# Patient Record
Sex: Female | Born: 1990 | Race: White | Hispanic: No | Smoking: Former smoker
Health system: Southern US, Community
[De-identification: ages and names within clinical notes are randomized; demographics above are authoritative.]

## PROBLEM LIST (undated history)

## (undated) ENCOUNTER — Inpatient Hospital Stay (HOSPITAL_COMMUNITY): Payer: Self-pay

## (undated) DIAGNOSIS — I639 Cerebral infarction, unspecified: Secondary | ICD-10-CM

## (undated) DIAGNOSIS — D649 Anemia, unspecified: Secondary | ICD-10-CM

## (undated) DIAGNOSIS — F419 Anxiety disorder, unspecified: Secondary | ICD-10-CM

## (undated) DIAGNOSIS — E785 Hyperlipidemia, unspecified: Secondary | ICD-10-CM

## (undated) DIAGNOSIS — E079 Disorder of thyroid, unspecified: Secondary | ICD-10-CM

## (undated) DIAGNOSIS — Q8719 Other congenital malformation syndromes predominantly associated with short stature: Secondary | ICD-10-CM

## (undated) DIAGNOSIS — K219 Gastro-esophageal reflux disease without esophagitis: Secondary | ICD-10-CM

## (undated) DIAGNOSIS — I10 Essential (primary) hypertension: Secondary | ICD-10-CM

## (undated) DIAGNOSIS — R51 Headache: Secondary | ICD-10-CM

## (undated) DIAGNOSIS — Z9851 Tubal ligation status: Secondary | ICD-10-CM

## (undated) DIAGNOSIS — F32A Depression, unspecified: Secondary | ICD-10-CM

## (undated) DIAGNOSIS — R519 Headache, unspecified: Secondary | ICD-10-CM

## (undated) DIAGNOSIS — E039 Hypothyroidism, unspecified: Secondary | ICD-10-CM

## (undated) DIAGNOSIS — G43909 Migraine, unspecified, not intractable, without status migrainosus: Secondary | ICD-10-CM

## (undated) DIAGNOSIS — F329 Major depressive disorder, single episode, unspecified: Secondary | ICD-10-CM

## (undated) HISTORY — DX: Anemia, unspecified: D64.9

## (undated) HISTORY — DX: Cerebral infarction, unspecified: I63.9

## (undated) HISTORY — PX: TOE SURGERY: SHX1073

## (undated) HISTORY — DX: Major depressive disorder, single episode, unspecified: F32.9

## (undated) HISTORY — DX: Other congenital malformation syndromes predominantly associated with short stature: Q87.19

## (undated) HISTORY — DX: Depression, unspecified: F32.A

## (undated) HISTORY — DX: Hyperlipidemia, unspecified: E78.5

## (undated) HISTORY — PX: TONSILLECTOMY: SUR1361

## (undated) HISTORY — DX: Migraine, unspecified, not intractable, without status migrainosus: G43.909

---
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
## 2009-03-22 ENCOUNTER — Emergency Department (HOSPITAL_COMMUNITY): Admission: EM | Admit: 2009-03-22 | Discharge: 2009-03-22 | Payer: Self-pay | Admitting: Emergency Medicine

## 2009-04-24 ENCOUNTER — Encounter: Payer: Self-pay | Admitting: Internal Medicine

## 2009-04-24 ENCOUNTER — Encounter: Admission: RE | Admit: 2009-04-24 | Discharge: 2009-04-24 | Payer: Self-pay | Admitting: Internal Medicine

## 2009-05-01 ENCOUNTER — Encounter: Admission: RE | Admit: 2009-05-01 | Discharge: 2009-05-01 | Payer: Self-pay | Admitting: Internal Medicine

## 2009-05-21 ENCOUNTER — Encounter: Payer: Self-pay | Admitting: Internal Medicine

## 2009-05-26 ENCOUNTER — Ambulatory Visit: Payer: Self-pay | Admitting: Internal Medicine

## 2009-05-26 DIAGNOSIS — R112 Nausea with vomiting, unspecified: Secondary | ICD-10-CM

## 2009-05-26 DIAGNOSIS — R1012 Left upper quadrant pain: Secondary | ICD-10-CM

## 2009-05-30 ENCOUNTER — Ambulatory Visit (HOSPITAL_COMMUNITY): Admission: RE | Admit: 2009-05-30 | Discharge: 2009-05-30 | Payer: Self-pay | Admitting: Internal Medicine

## 2010-06-14 ENCOUNTER — Emergency Department (HOSPITAL_COMMUNITY): Admission: EM | Admit: 2010-06-14 | Discharge: 2010-06-15 | Payer: Self-pay | Admitting: Emergency Medicine

## 2010-06-14 ENCOUNTER — Emergency Department (HOSPITAL_COMMUNITY): Admission: EM | Admit: 2010-06-14 | Discharge: 2010-06-14 | Payer: Self-pay | Admitting: Emergency Medicine

## 2010-12-17 LAB — CBC
HCT: 35.9 % — ABNORMAL LOW (ref 36.0–46.0)
Hemoglobin: 12.3 g/dL (ref 12.0–15.0)
MCH: 30.8 pg (ref 26.0–34.0)
MCHC: 34.3 g/dL (ref 30.0–36.0)
RBC: 3.99 MIL/uL (ref 3.87–5.11)

## 2010-12-17 LAB — URINALYSIS, ROUTINE W REFLEX MICROSCOPIC
Bilirubin Urine: NEGATIVE
Glucose, UA: NEGATIVE mg/dL
Ketones, ur: NEGATIVE mg/dL
Nitrite: NEGATIVE
Specific Gravity, Urine: 1.012 (ref 1.005–1.030)
pH: 8 (ref 5.0–8.0)

## 2010-12-17 LAB — DIFFERENTIAL
Basophils Relative: 0 % (ref 0–1)
Eosinophils Absolute: 0.1 10*3/uL (ref 0.0–0.7)
Lymphs Abs: 1.8 10*3/uL (ref 0.7–4.0)
Monocytes Absolute: 0.6 10*3/uL (ref 0.1–1.0)
Monocytes Relative: 7 % (ref 3–12)
Neutro Abs: 6.3 10*3/uL (ref 1.7–7.7)
Neutrophils Relative %: 71 % (ref 43–77)

## 2010-12-17 LAB — PREGNANCY, URINE: Preg Test, Ur: NEGATIVE

## 2010-12-17 LAB — WET PREP, GENITAL
Clue Cells Wet Prep HPF POC: NONE SEEN
Trich, Wet Prep: NONE SEEN
Yeast Wet Prep HPF POC: NONE SEEN

## 2010-12-17 LAB — POCT URINALYSIS DIPSTICK
Bilirubin Urine: NEGATIVE
Glucose, UA: NEGATIVE mg/dL
Ketones, ur: NEGATIVE mg/dL
Nitrite: NEGATIVE

## 2010-12-17 LAB — POCT PREGNANCY, URINE: Preg Test, Ur: NEGATIVE

## 2011-01-11 LAB — GC/CHLAMYDIA PROBE AMP, GENITAL: Chlamydia, DNA Probe: NEGATIVE

## 2011-01-11 LAB — POCT I-STAT, CHEM 8
BUN: 4 mg/dL — ABNORMAL LOW (ref 6–23)
Calcium, Ion: 1.17 mmol/L (ref 1.12–1.32)
Chloride: 109 mEq/L (ref 96–112)
Creatinine, Ser: 0.7 mg/dL (ref 0.4–1.2)
Glucose, Bld: 91 mg/dL (ref 70–99)
TCO2: 25 mmol/L (ref 0–100)

## 2011-01-11 LAB — URINALYSIS, ROUTINE W REFLEX MICROSCOPIC
Glucose, UA: NEGATIVE mg/dL
Nitrite: NEGATIVE
Specific Gravity, Urine: 1.002 — ABNORMAL LOW (ref 1.005–1.030)
pH: 6.5 (ref 5.0–8.0)

## 2011-01-11 LAB — HCG, QUANTITATIVE, PREGNANCY: hCG, Beta Chain, Quant, S: <5 m[IU]/mL (ref <5)

## 2011-01-11 LAB — WET PREP, GENITAL: Yeast Wet Prep HPF POC: NONE SEEN

## 2011-02-19 NOTE — Op Note (Signed)
Christine Beard, Christine Beard            ACCOUNT NO.:  [id number]   MEDICAL RECORD NO.:  [id number]          PATIENT TYPE:  AMB   LOCATION:  DSC                          FACILITY:  [facility]   PHYSICIAN:  Suzanna Obey, M.D.       DATE OF BIRTH:  07-Nov-1990   DATE OF PROCEDURE:  11/12/2004  DATE OF DISCHARGE:                                 OPERATIVE REPORT   PREOPERATIVE DIAGNOSIS:  Chronic tonsillitis.   POSTOPERATIVE DIAGNOSIS:  Chronic tonsillitis.   OPERATION PERFORMED:  Tonsillectomy.   SURGEON:  Suzanna Obey, M.D.   ANESTHESIA:  General endotracheal tube.   ESTIMATED BLOOD LOSS:  Less than 5 mL.   INDICATIONS FOR PROCEDURE:  The patient is a 20 year old who has had chronic  sore throats for many years that have been refractory to medical therapy.  She and her mother were informed of the risks and benefits of the procedure  including bleeding, infection, velopharyngeal insufficiency, change in the  voice, chronic pain, and risks of the anesthetic.  All questions were  answered and consent was obtained.   DESCRIPTION OF PROCEDURE:  The patient was taken to the operating room and  placed in supine position.  After adequate general endotracheal tube  anesthesia, the patient was placed in the Rose position, draped in the usual  sterile manner.  The Crowe-Davis mouth gag was inserted, retracted and  suspended from the Mayo stand.  The palate was checked and there was no  adenoid tissue and the palate was of adequate length.  The left tonsil was  begun by making a left anterior tonsillar pillar incision identifying the  capsule and tonsil and removing it with electrocautery dissection.  The  right tonsil removed in the same fashion.  There was good hemostasis with  suction cautery.  The hypopharynx, esophagus and stomach were suctioned with  the nasogastric tube.  The patient was then released, the Crowe-Davis was  resuspended and there was hemostasis present in all locations.  The patient  was then awakened and brought to recovery in stable condition.  Counts were  correct.      JB/MEDQ  D:  11/12/2004  T:  11/12/2004  Job:  [id number]   cc:   Olena Leatherwood [facility]

## 2011-04-08 ENCOUNTER — Inpatient Hospital Stay (HOSPITAL_COMMUNITY)
Admission: EM | Admit: 2011-04-08 | Discharge: 2011-04-13 | DRG: 918 | Disposition: A | Discharge status: Home / Self Care | Payer: Medicaid Other | Attending: Internal Medicine | Admitting: Internal Medicine

## 2011-04-08 DIAGNOSIS — F17200 Nicotine dependence, unspecified, uncomplicated: Secondary | ICD-10-CM | POA: Diagnosis present

## 2011-04-08 DIAGNOSIS — E039 Hypothyroidism, unspecified: Secondary | ICD-10-CM | POA: Diagnosis present

## 2011-04-08 DIAGNOSIS — T6391XA Toxic effect of contact with unspecified venomous animal, accidental (unintentional), initial encounter: Principal | ICD-10-CM | POA: Diagnosis present

## 2011-04-08 DIAGNOSIS — D509 Iron deficiency anemia, unspecified: Secondary | ICD-10-CM | POA: Diagnosis present

## 2011-04-08 DIAGNOSIS — Y92410 Unspecified street and highway as the place of occurrence of the external cause: Secondary | ICD-10-CM

## 2011-04-08 DIAGNOSIS — T63001A Toxic effect of unspecified snake venom, accidental (unintentional), initial encounter: Secondary | ICD-10-CM | POA: Diagnosis present

## 2011-04-08 DIAGNOSIS — F341 Dysthymic disorder: Secondary | ICD-10-CM | POA: Diagnosis present

## 2011-04-09 LAB — CBC
HCT: 38.1 % (ref 36.0–46.0)
HCT: 37.8 % (ref 36.0–46.0)
Hemoglobin: 12.4 g/dL (ref 12.0–15.0)
MCH: 28.8 pg (ref 26.0–34.0)
MCH: 30.1 pg (ref 26.0–34.0)
MCHC: 31.2 g/dL (ref 30.0–36.0)
MCHC: 32.3 g/dL (ref 30.0–36.0)
MCHC: 32.4 g/dL (ref 30.0–36.0)
MCHC: 33.2 g/dL (ref 30.0–36.0)
MCV: 90.7 fL (ref 78.0–100.0)
MCV: 92 fL (ref 78.0–100.0)
Platelets: 133 10*3/uL — ABNORMAL LOW (ref 150–400)
Platelets: 138 10*3/uL — ABNORMAL LOW (ref 150–400)
Platelets: 165 10*3/uL (ref 150–400)
RBC: 4.16 MIL/uL (ref 3.87–5.11)
RBC: 4.28 MIL/uL (ref 3.87–5.11)
RDW: 12.5 % (ref 11.5–15.5)
RDW: 12.6 % (ref 11.5–15.5)
RDW: 12.6 % (ref 11.5–15.5)
WBC: 12.5 10*3/uL — ABNORMAL HIGH (ref 4.0–10.5)

## 2011-04-09 LAB — URINALYSIS, ROUTINE W REFLEX MICROSCOPIC
Ketones, ur: NEGATIVE mg/dL
Leukocytes, UA: NEGATIVE
Nitrite: NEGATIVE
Protein, ur: NEGATIVE mg/dL
pH: 6 (ref 5.0–8.0)

## 2011-04-09 LAB — PROTIME-INR
INR: 1.14 (ref 0.00–1.49)
INR: 1.07 (ref 0.00–1.49)
Prothrombin Time: 14.8 seconds (ref 11.6–15.2)

## 2011-04-09 LAB — DIFFERENTIAL
Basophils Absolute: 0 10*3/uL (ref 0.0–0.1)
Eosinophils Absolute: 0.2 10*3/uL (ref 0.0–0.7)
Eosinophils Relative: 1 % (ref 0–5)
Eosinophils Relative: 2 % (ref 0–5)
Lymphocytes Relative: 15 % (ref 12–46)
Lymphs Abs: 1.5 10*3/uL (ref 0.7–4.0)
Monocytes Absolute: 0.7 10*3/uL (ref 0.1–1.0)
Monocytes Relative: 9 % (ref 3–12)
Neutro Abs: 9.4 10*3/uL — ABNORMAL HIGH (ref 1.7–7.7)
Neutrophils Relative %: 76 % (ref 43–77)

## 2011-04-09 LAB — BASIC METABOLIC PANEL
BUN: 11 mg/dL (ref 6–23)
CO2: 21 mEq/L (ref 19–32)
Calcium: 8.2 mg/dL — ABNORMAL LOW (ref 8.4–10.5)
Glucose, Bld: 86 mg/dL (ref 70–99)
Sodium: 138 mEq/L (ref 135–145)

## 2011-04-09 LAB — PHOSPHORUS: Phosphorus: 4.4 mg/dL (ref 2.3–4.6)

## 2011-04-09 LAB — TSH: TSH: 2.216 u[IU]/mL (ref 0.350–4.500)

## 2011-04-09 LAB — PREGNANCY, URINE: Preg Test, Ur: NEGATIVE

## 2011-04-09 LAB — COMPREHENSIVE METABOLIC PANEL
Albumin: 3.1 g/dL — ABNORMAL LOW (ref 3.5–5.2)
Alkaline Phosphatase: 53 U/L (ref 39–117)
BUN: 9 mg/dL (ref 6–23)
Potassium: 3.7 mEq/L (ref 3.5–5.1)
Total Protein: 5.7 g/dL — ABNORMAL LOW (ref 6.0–8.3)

## 2011-04-09 LAB — FIBRINOGEN
Fibrinogen: 240 mg/dL (ref 204–475)
Fibrinogen: 287 mg/dL (ref 204–475)

## 2011-04-09 LAB — MAGNESIUM: Magnesium: 2 mg/dL (ref 1.5–2.5)

## 2011-04-09 LAB — APTT: aPTT: 30 seconds (ref 24–37)

## 2011-04-10 LAB — URINE CULTURE: Culture  Setup Time: 201207061544

## 2011-04-10 LAB — BASIC METABOLIC PANEL
BUN: 7 mg/dL (ref 6–23)
CO2: 24 mEq/L (ref 19–32)
Chloride: 111 mEq/L (ref 96–112)
GFR calc non Af Amer: >60 mL/min (ref >60)
Glucose, Bld: 130 mg/dL — ABNORMAL HIGH (ref 70–99)
Potassium: 3.9 mEq/L (ref 3.5–5.1)

## 2011-04-10 LAB — IRON AND TIBC
Iron: 19 ug/dL — ABNORMAL LOW (ref 42–135)
TIBC: 199 ug/dL — ABNORMAL LOW (ref 250–470)
UIBC: 180 ug/dL

## 2011-04-10 LAB — CBC
HCT: 34.5 % — ABNORMAL LOW (ref 36.0–46.0)
Hemoglobin: 11.2 g/dL — ABNORMAL LOW (ref 12.0–15.0)
MCHC: 32.5 g/dL (ref 30.0–36.0)
RBC: 3.75 MIL/uL — ABNORMAL LOW (ref 3.87–5.11)

## 2011-04-10 LAB — DIFFERENTIAL
Basophils Absolute: 0 10*3/uL (ref 0.0–0.1)
Lymphocytes Relative: 11 % — ABNORMAL LOW (ref 12–46)
Monocytes Absolute: 0.8 10*3/uL (ref 0.1–1.0)
Neutro Abs: 6.5 10*3/uL (ref 1.7–7.7)
Neutrophils Relative %: 80 % — ABNORMAL HIGH (ref 43–77)

## 2011-04-10 LAB — VITAMIN B12: Vitamin B-12: 388 pg/mL (ref 211–911)

## 2011-04-11 LAB — DIFFERENTIAL
Basophils Relative: 0 % (ref 0–1)
Eosinophils Absolute: 0 10*3/uL (ref 0.0–0.7)
Monocytes Absolute: 0.3 10*3/uL (ref 0.1–1.0)
Monocytes Relative: 3 % (ref 3–12)

## 2011-04-11 LAB — FIBRINOGEN: Fibrinogen: 257 mg/dL (ref 204–475)

## 2011-04-11 LAB — CBC
MCH: 29.2 pg (ref 26.0–34.0)
MCHC: 31.5 g/dL (ref 30.0–36.0)
Platelets: 135 10*3/uL — ABNORMAL LOW (ref 150–400)

## 2011-04-11 LAB — BASIC METABOLIC PANEL
CO2: 23 mEq/L (ref 19–32)
Calcium: 7.8 mg/dL — ABNORMAL LOW (ref 8.4–10.5)
Creatinine, Ser: 0.53 mg/dL (ref 0.50–1.10)
Glucose, Bld: 162 mg/dL — ABNORMAL HIGH (ref 70–99)
Sodium: 138 mEq/L (ref 135–145)

## 2011-04-11 NOTE — H&P (Signed)
Christine Beard, Christine Beard            ACCOUNT NO.:  [id number]  MEDICAL RECORD NO.:  [id number]  LOCATION:  WLED                         FACILITY:  [facility]  PHYSICIAN:  Michiel Cowboy, MDDATE OF BIRTH:  08-20-1991  DATE OF ADMISSION:  04/08/2011 DATE OF DISCHARGE:                             HISTORY & PHYSICAL   PRIMARY CARE PROVIDER:  None.  CHIEF COMPLAINT:  The patient is a 20 year old female who around 10 to 10:30 p.m. on the 5th was walking and saw a copperhead on the road.  She attempted to remove it with her hand and got bit to the tip of her palm on the right hand.  Since then, she has developed some swelling of the hand, presented to the emergency department, received CroFab and Dilaudid for pain management.  She developed some progression of the swelling above the elbow and continued to have pain at which point emergency physician wrote for 2nd dose of CroFab and she is going to be admitted for further observation.  I have spoke to [facility], who recommended monitoring her after the 2nd dose was given and then deciding whether her maintenance would be needed.  If there is still progression of the circumference of swelling or worsening pain, then she may benefit from maintenance dosage of CroFab 2 g IV q.6 h. for 3 more doses.  At this point, we will just monitor.  The patient does endorse some itching, which is generalized.  There is no rash.  The itching started when she had simultaneously received Dilaudid and CroFab.  Since then, she had been receiving regular doses of Dilaudid. She did receive 25 IV of Benadryl, which made her sleepy, but she still has some itching persistent.  There is no fever, no chills.  No nausea, vomiting, or diarrhea.  Otherwise, no shortness of breath.  No chest pain.  Rest of the review of systems is negative except for HPI.  PAST MEDICAL HISTORY:  Significant for anxiety and depression.  SOCIAL HISTORY:  The patient is a  smoker.  Does not drink or abuse drugs.  FAMILY HISTORY:  Noncontributory.  ALLERGIES:  Codeine makes her nauseous.  MEDICATIONS:  She cannot recollect, but she thinks she takes a thyroid medicine, a depression medicine, and something for sleep.  PHYSICAL EXAMINATION:  VITAL SIGNS:  Temperature 98.7, blood pressure 123/70, pulse 97, respirations 22, satting 98% on room air. GENERAL:  She appears to be currently in no acute distress. HEENT:  Head:  Nontraumatic.  Moist mucous membranes. LUNGS:  Clear to auscultation bilaterally. HEART:  Regular rate and rhythm.  No murmurs appreciated. ABDOMEN:  Soft, nontender, nondistended. EXTREMITIES:  Lower extremities without clubbing, cyanosis, or edema. Right upper extremity showing some swelling of the hand.  She actually states that the hand swelling has improved somewhat.  The swelling has progressed about 3 cm above her elbow.  There is no redness.  There is no rash.  No blistering.  Of note, she has a scratch on her back that is unrelated and was secondary to a different injury.  LABORATORY DATA:  White blood cell count 8.3, hemoglobin 12.9, platelets 133.  Unknown baseline.  Creatinine 0.69, sodium 138, potassium 3.6,  INR 1.14 with fibrinogen of 258, which is normal.  ASSESSMENT AND PLAN: 1. This is a 20 year old with a snake bite.  The snake has been     brought with her and appears to be a copperhead.  The patient is     about to receive her 2nd dose of CroFab.  We will continue     monitoring after that.  If the patient continues to have     progression of enlargement of her swelling and particularly the     enlargement of circumference of the swelling some progression of     the arm is actually expected and worsening pain, may benefit from a     maintenance dose initiated 6 hours after the second dose of CroFab,     which is 2 g IV q.6 for 3 more doses.  If some improvement is noted     within the window of the 6 hours, we can  hold off on the     maintenance dose.  We will watch her in the step-down for close     monitoring.  Recheck her fibrinogen, CBC and INR q.4 h. for 12     hours.  So far, her platelets have been a little bit low.  We will     continue to watch her trend. 2. History of depression and anxiety.  We will need to restart her     medications once her boyfriend brings then in to really do not know     what she is actually taking. 3. History of thyroid abnormality.  We will check a TSH and restart     her meds once we know what they are. 4. Itching.  This is unclear if it is related to Dilaudid or CroFab     since she had been receiving both at the same time and itching has     persisted, but she also has received a total of 3 doses of Dilaudid     at least.  We will switch her to morphine for pain control and     continue to give Benadryl as needed.  If her symptoms worsen and if     she develops generalized rash or worsening itching after the 2nd     dose of CroFab, may benefit from Solu-Medrol.  At this point, she     has not had any rash or difficulty breathing or any other swelling     besides the site of the snakebite. 5. Prophylaxis.  Good p.o. intake and SCDs.     Michiel Cowboy, MD     AVD/MEDQ  D:  04/09/2011  T:  04/09/2011  Job:  [id number]  Electronically Signed by Therisa Doyne MD on 04/11/2011 03:00:49 AM

## 2011-04-12 LAB — DIFFERENTIAL
Basophils Absolute: 0 10*3/uL (ref 0.0–0.1)
Eosinophils Relative: 0 % (ref 0–5)
Lymphocytes Relative: 18 % (ref 12–46)
Monocytes Absolute: 1 10*3/uL (ref 0.1–1.0)
Monocytes Relative: 10 % (ref 3–12)

## 2011-04-12 LAB — BASIC METABOLIC PANEL
BUN: 7 mg/dL (ref 6–23)
CO2: 28 mEq/L (ref 19–32)
GFR calc non Af Amer: >60 mL/min (ref >60)
Glucose, Bld: 95 mg/dL (ref 70–99)
Potassium: 3.7 mEq/L (ref 3.5–5.1)

## 2011-04-12 LAB — CBC
HCT: 34.9 % — ABNORMAL LOW (ref 36.0–46.0)
MCH: 29.8 pg (ref 26.0–34.0)
MCHC: 32.1 g/dL (ref 30.0–36.0)
MCV: 92.8 fL (ref 78.0–100.0)
RDW: 13 % (ref 11.5–15.5)

## 2011-04-13 LAB — CBC
MCH: 29.4 pg (ref 26.0–34.0)
MCHC: 31.5 g/dL (ref 30.0–36.0)
MCV: 93.4 fL (ref 78.0–100.0)
Platelets: 146 10*3/uL — ABNORMAL LOW (ref 150–400)
RBC: 3.77 MIL/uL — ABNORMAL LOW (ref 3.87–5.11)
RDW: 13.1 % (ref 11.5–15.5)

## 2011-04-21 NOTE — Discharge Summary (Signed)
Christine Beard NO.:  [id number]  MEDICAL RECORD NO.:  [id number]  LOCATION:  1508                         FACILITY:  [facility]  PHYSICIAN:  Ramiro Harvest, MD    DATE OF BIRTH:  1991-04-25  DATE OF ADMISSION:  04/08/2011 DATE OF DISCHARGE:  04/13/2011                        DISCHARGE SUMMARY    ANTICIPATED DATE OF DISCHARGE:  April 13, 2011  PRIMARY CARE PHYSICIAN:  Tomi Bamberger, NP, in [place].  DISCHARGE DIAGNOSES: 1. Snakebite, improved. 2. Iron-deficiency anemia. 3. Hypothyroidism. 4. Anxiety. 5. Depression. 6. Tobacco abuse.  DISCHARGE MEDICATIONS: 1. Hydroxyzine 25 mg p.o. q.6 h p.r.n. itching. 2. Oxycodone 5 mg p.o. q.4 h p.r.n. pain. 3. Aleve 220 mg 2 tablets p.o. q.6 h p.r.n. 4. Celexa 40 mg p.o. daily. 5. Levothyroxine 50 mcg p.o. daily. 6. Multivitamin with iron 1 tablet p.o. daily. 7. Prozac 40 mg p.o. daily. 8. Trazodone 50 mg 2 tablets p.o. at bedtime. 9. Zofran 4 mg p.o. q.8 h p.r.n.  DISPOSITION AND FOLLOWUP:  The patient will be discharged home.  The patient is to follow up with PCP 1 week post discharge.  On followup, the patient is supposed to keep her right upper extremity elevated above her heart to aid with the swelling.  The patient will also be followed by Poison Control once the patient is discharged.  On her followup with PCP, the patient will need a CBC checked to follow up on her platelets and hemoglobin.  CONSULTATIONS DONE:  None.  PROCEDURES PERFORMED:  None.  BRIEF ADMISSION HISTORY AND PHYSICAL:  Ms. Christine Beard is a 20 year old female who around 10 to 10:30 p.m. on July 5 was walking, saw a copperhead on the road.  She attempted to remove it with her hand and got bit at the tip of her palm on the right hand.  Since then she developed some swelling of the hand, presented to the ED, received CroFab and Dilaudid for pain management.  The patient developed some progression of the swelling above the elbow,  continued to have pain at which point ED wrote for a second dose of CroFab and the patient was being admitted for further observation.  Admitting physician spoke to [facility] who recommended monitoring her after the second dose was given and deciding whether maintenance would be needed.  If there was still progression of the circumference of swelling or worsening pain, the patient may benefit from maintenance doses of CroFab 2 g IV q.6 h for three more doses.  The patient did endorse some itching, which was generalized, denied any rash.  The itching started when she had simultaneously received Dilaudid and CroFab.  Since then the patient had been receiving regular doses of Dilaudid.  She did receive Benadryl 25 mg IV which made her sleepy but itching was still persistent.  Denied any fever.  No chills, no nausea, no vomiting, no diarrhea.  Otherwise, no shortness of breath, no chest pain.  Review of systems was negative except for HPI.  For the rest of admission history and physical, please see H and P dictated by Dr. Adela Glimpse of job number [id number].  HOSPITAL COURSE: 1. Snake bite:  The patient was admitted with a  snakebite which     appeared to be a copperhead.  The patient received two doses of     CroFab in the ED.  She was subsequently admitted to the step-down     unit where she was monitored.  The patient's swelling was pursued     and swelling progressed somewhat and as such she was given two more     doses of CroFab.  The patient was also given some IV steroids as     well as hydroxyzine for itching which relieved her itching.  The     patient was monitored and followed.  Coags were obtained, which     were within normal limits, as well as fibrinogen levels were also     obtained which were within normal limits.  The patient's upper     extremity was further elevated above her heart to help with the     swelling.  The patient remained in stable condition and the  patient     improved somewhat.  The patient was subsequently transferred to the     floor where she remained.  Measurements every 4 hours of the     circumference of the hand, wrist, forearm and biceps were done and     showed decrease in circumference from the hand and wrists as well     as the forearm and biceps.  This continued to improve and the     swelling improved and the patient is currently in a stable and     improved condition.  The patient will be discharged home in stable     and improved condition to follow up with PCP as stated above.     Poison Control Center will be notified when the patient is being     discharged so she can be followed up as an outpatient.  The patient     was discharged in stable and improved condition. 2. The rest of the patient's chronic medical issues remained stable     throughout the hospitalization. 3. Labs as of April 12, 2011:  White count of 10.2, hemoglobin 11.2,     hematocrit 34.9 with a platelet count of 131.  BMET with a sodium     of 142, potassium 3.7, chloride 110, bicarb 28, glucose 95, BUN 7,     creatinine 0.58, and calcium of 8.3.  It has been a pleasure taking care of Ms. Christine Beard.     Ramiro Harvest, MD     DT/MEDQ  D:  04/12/2011  T:  04/12/2011  Job:  [id number]  cc:   Tomi Bamberger, NP Fax: [phone]  Electronically Signed by Ramiro Harvest MD on 04/21/2011 06:32:51 PM

## 2011-04-22 ENCOUNTER — Ambulatory Visit
Admission: RE | Admit: 2011-04-22 | Discharge: 2011-04-22 | Disposition: A | Discharge status: Home / Self Care | Payer: Medicaid Other | Source: Ambulatory Visit | Attending: Internal Medicine | Admitting: Internal Medicine

## 2011-04-22 ENCOUNTER — Other Ambulatory Visit: Payer: Self-pay | Admitting: Internal Medicine

## 2011-04-22 DIAGNOSIS — R52 Pain, unspecified: Secondary | ICD-10-CM

## 2011-12-15 ENCOUNTER — Emergency Department (HOSPITAL_COMMUNITY)
Admission: EM | Admit: 2011-12-15 | Discharge: 2011-12-15 | Disposition: A | Discharge status: Home / Self Care | Payer: Medicaid Other | Source: Home / Self Care

## 2011-12-15 ENCOUNTER — Encounter (HOSPITAL_COMMUNITY): Payer: Self-pay | Admitting: *Deleted

## 2011-12-15 DIAGNOSIS — R112 Nausea with vomiting, unspecified: Secondary | ICD-10-CM

## 2011-12-15 HISTORY — DX: Disorder of thyroid, unspecified: E07.9

## 2011-12-15 LAB — POCT URINALYSIS DIP (DEVICE)
Glucose, UA: NEGATIVE mg/dL
Nitrite: NEGATIVE
Protein, ur: NEGATIVE mg/dL
Urobilinogen, UA: 0.2 mg/dL (ref 0.0–1.0)

## 2011-12-15 LAB — POCT PREGNANCY, URINE: Preg Test, Ur: NEGATIVE

## 2011-12-15 MED ORDER — ONDANSETRON HCL 4 MG PO TABS: 4.0000 mg | ORAL_TABLET | Freq: Three times a day (TID) | ORAL | Status: AC | PRN

## 2011-12-15 MED ORDER — PROMETHAZINE HCL 12.5 MG PO TABS: 12.5000 mg | ORAL_TABLET | Freq: Four times a day (QID) | ORAL | Status: DC | PRN

## 2011-12-15 MED ORDER — PROMETHAZINE HCL 12.5 MG RE SUPP: 12.5000 mg | Freq: Four times a day (QID) | RECTAL | Status: DC | PRN

## 2011-12-15 NOTE — ED Notes (Signed)
pT  C/PO  NAUSEA    AND  VOMITING  X  4  DAYS  WITH  ASSOCIATED  SYMPTOMS  OF  DIZZYNESS  AS  WELL   PT  UNSURE  OF HER LMP    AND  STOPPED  HER  DEPO  SHOT  SHE IS  ALSO  SEXUALLY  ACTIVE      -  SHE IS  AWAKE  AS  WELL AS  ALERTA ND  ORIENTED  SKIN IS  WARM  AND  DRY

## 2011-12-15 NOTE — ED Provider Notes (Signed)
Christine Beard is a 21 y.o. female who presents to Urgent Care today for vomiting and nausea for 3 days.  Patient developed nausea vomiting and dizziness starting Saturday evening.  She has continued to have nausea and vomiting since then. She denies any fevers chills abdominal pain vertigo syncope diarrhea or blood in the stool or vomit.  She is a pertinent past history of frequent episodes of nausea and has been on Zofran in the past.  She cannot remember when her last menstrual period was however she has been using depo Provera.  Her last dose of depo was 5 months ago.  She has been having unprotected sex.     PMH reviewed. Significant for hypothyroidism, depression, frequent vomiting ROS as above otherwise neg Medications reviewed. No current facility-administered medications for this encounter.   Current Outpatient Prescriptions  Medication Sig Dispense Refill  . citalopram (CELEXA) 40 MG tablet Take 40 mg by mouth daily.      Marland Kitchen FLUoxetine (PROZAC) 40 MG capsule Take 40 mg by mouth daily.      Marland Kitchen levothyroxine (SYNTHROID, LEVOTHROID) 50 MCG tablet Take 50 mcg by mouth daily.      . ondansetron (ZOFRAN) 4 MG tablet Take 4 mg by mouth every 8 (eight) hours as needed.      . traZODone (DESYREL) 100 MG tablet Take 100 mg by mouth at bedtime.      . ondansetron (ZOFRAN) 4 MG tablet Take 1 tablet (4 mg total) by mouth every 8 (eight) hours as needed for nausea.  20 tablet  0  . promethazine (PHENERGAN) 12.5 MG suppository Place 1 suppository (12.5 mg total) rectally every 6 (six) hours as needed for nausea.  12 each  0  . promethazine (PHENERGAN) 12.5 MG tablet Take 1 tablet (12.5 mg total) by mouth every 6 (six) hours as needed for nausea.  30 tablet  0    Exam:  BP 124/76  Pulse 70  Temp(Src) 98.6 F (37 C) (Oral)  Resp 18  SpO2 100% Gen: Well NAD, petite woman. HEENT: EOMI,  MMM Lungs: CTABL Nl WOB Heart: RRR no MRG Abd: NABS, NT, ND Exts: Non edematous BL  LE, warm and well  perfused.   Urine pregnancy test is negative Urinalysis is bland and normal  Assessment and Plan: 21 year old woman with vomiting.  No abdominal pain fevers chills or other indication of viral gastroenteritis or other serious abdominal process. 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  She appears to be clinically stable tonight.  Plan to prescribe Zofran, oral and rectal Phenergan as needed.  I encouraged her to followup with a primary care doctor as soon as possible and provided instructions on how to do that. Additionally I advised using condoms to not get pregnant. Her amenorrhea is normal following Depo-Provera.  Discussed precautions with patient who expresses understanding.     Rodolph Bong, MD 12/15/11 [phone]

## 2011-12-15 NOTE — Discharge Instructions (Signed)
Thank you for coming in today. I am not sure why you continue to have vomiting.   Use the Zofran and Phenergan pills as needed.  The Phenergan will make you sleepy. If you cannot keep the Phenergan or Zofran pills down use the Phenergan suppository up your bottom as needed. Try to drink small amounts of liquids frequently.  G-2 Gatorade will help. Try to eat what you can.  Call [phone] to get a doctor near you. Use condoms every time when you have sex so that you do not get pregnant. Go to the emergency room for very bad belly pain, or uncontrollable vomiting.  Cyclic Vomiting Syndrome Cyclic vomiting syndrome (CVS) is a benign condition in which patients experience bouts or cycles of severe nausea and vomiting that last for hours or even days. The bouts of nausea and vomiting alternate with longer periods of no symptoms and generally good health. CVS occurs mostly in children, but can affect adults. CVS has no known cause. Each episode is typically similar to the previous ones. The episodes tend to:   Start at about the same time of day.   Last the same length of time.   Present the same symptoms at the same level of intensity.  CVS can begin at any age in children and adults. CVS usually starts between the ages of 54 and 92. In adults, episodes tend to occur less often than they do in children, but they last longer. Furthermore, the events or situations that trigger episodes in adults cannot always be pinpointed as easily as they can in children. THE FOUR PHASES OF CVS 1. Prodrome.  2. Episode.  3. Recovery.  4. Symptom-free interval.  The prodrome phase signals that an episode of nausea and vomiting is about to begin. This phase can last from just a few minutes to several hours. This phase is often marked by belly (abdominal) pain. Sometimes taking medicine early in the prodrome phase can stop an episode in progress. However, sometimes there is no warning. A person may simply wake up in  the middle of the night or early morning and begin vomiting. The episode phase consists of:  Severe vomiting.   Nausea.   Gagging (retching).  The recovery phase begins when the nausea and vomiting stop. Healthy color, appetite, and energy return. The symptom-free interval phase is the period between episodes when no symptoms are present. TRIGGERS Episodes can be triggered by an infection or event. Examples of triggers include:  Infections.   Colds, allergies, sinus problems, and the flu.   Eating certain foods such as chocolate or cheese.   Foods with MSG or preservatives.   Fast foods.   Pre-packaged foods.   Foods with low nutritional value (junk foods).   Overeating.   Eating just before going to bed.   Hot weather.   Dehydration.   Not enough sleep or poor sleep quality.   Physical exhaustion.   Menstruation.   Motion sickness.   Emotional stress (school or home difficulties).   Excitement or stress.  SYMPTOMS  The main symptoms of CVS are:  Severe vomiting.   Nausea.   Gagging (retching).  Episodes usually begin at night or the first thing in the morning. Episodes may include vomiting or retching up to 5 or 6 times an hour during the worst of the episode. Episodes usually last anywhere from 1 to 4 days. Episodes can last for up to 10 days. Other symptoms include:  Paleness.   Exhaustion.  Listlessness.   Abdominal pain.   Loose stools or diarrhea.  Sometimes the nausea and vomiting are so severe that a person appears to be almost unconscious. Sensitivity to light, headache, fever, dizziness, may also accompany an episode. In addition, the vomiting may cause drooling and excessive thirst. Drinking water usually leads to more vomiting, though the water can dilute the acid in the vomit, making the episode a little less painful. Continuous vomiting can lead to dehydration, which means that the body has lost excessive water and salts. DIAGNOSIS    CVS is hard to diagnose because there are no clear tests to identify it. A caregiver must diagnose CVS by looking at symptoms and medical history. A caregiver must exclude more common diseases or disorders that can also cause nausea and vomiting. Also, diagnosis takes time because caregivers need to identify a pattern or cycle to the vomiting. CVS AND MIGRAINE The relationship between migraine and CVS is still unclear. Medical researchers believe that they are related for 3 reasons: 1. Migraine headaches (which cause severe pain in the head), abdominal migraine (which causes stomach pain), and CVS are all marked by severe symptoms that start quickly and end abruptly, followed by longer periods without pain or other symptoms.  2. Many of the situations that trigger CVS also trigger migraines. Those triggers include stress and excitement.  3. Research has shown that many children with CVS either have a family history of migraine or develop migraines as they grow older.  Because of the similarities between migraine and CVS, caregivers treat some people with severe CVS with drugs that are also used for migraine headaches. The drugs are designed to:  Prevent episodes.   Reduce their frequency.   Lessen their severity.  TREATMENT  CVS cannot be cured. Treatment varies, but people with CVS should get plenty of rest and sleep and take medications that prevent, stop, or lessen the vomiting episodes and other symptoms. Once a vomiting episode begins, treatment is supportive. It helps to stay in bed and sleep in a dark, quiet room. Severe nausea and vomiting may require hospitalization and intravenous (IV) fluids to prevent dehydration. Relaxing medications (sedatives) may help if the nausea continues. Sometimes, during the prodrome phase, it is possible to stop an episode from happening altogether. Only take over-the-counter or prescription medicines for pain, discomfort or fever as directed by your  caregiver. Do not give aspirin to children. During the recovery phase, drinking water and replacing lost electrolytes (salts in the blood) are very important. Electrolytes are salts that the body needs to function well and stay healthy. Symptoms during the recovery phase can vary. Some people find that their appetites return to normal immediately, while others need to begin by drinking clear liquids and then move slowly to solid food. People whose episodes are frequent and long-lasting may be treated during the symptom-free intervals in an effort to prevent or ease future episodes. Medications that help people with migraine headaches are sometimes used during this phase, but they do not work for everyone. Taking the medicine daily for 1 to 2 months may be necessary to see if it helps. The symptom-free phase is a good time to eliminate anything known to trigger an episode. For example, if episodes are brought on by stress or excitement, this period is the time to find ways to reduce stress and stay calm. If sinus problems or allergies cause episodes, those conditions should be treated. The triggers listed above should be avoided or prevented. RELATED COMPLICATIONS  The severe vomiting that defines CVS is a risk factor for several complications:  Dehydration. Vomiting causes the body to lose water quickly.   Electrolyte imbalance. Vomiting also causes the body to lose the important salts it needs to keep working properly.   Peptic esophagitis. The tube that connects the mouth to the stomach (esophagus) becomes injured from the stomach acid that comes up with the vomit.   Hematemesis. The esophagus becomes irritated and bleeds, so blood mixes with the vomit.   Mallory-Weiss tear. The lower end of the esophagus may tear open or the stomach may bruise from vomiting or retching.   Tooth decay. The acid in the vomit can hurt the teeth by corroding the tooth enamel.  SEEK MEDICAL CARE IF: You have questions  or problems. Document Released: 11/29/2001 Document Revised: 09/09/2011 Document Reviewed: 12/28/2010 [facility] Patient Information 2012 [place], [place].

## 2011-12-16 NOTE — ED Provider Notes (Signed)
Medical screening examination/treatment/procedure(s) were performed by a resident physician and as supervising physician I was immediately available for consultation/collaboration.  Leslee Home, M.D.   Reuben Likes, MD 12/16/11 [phone]

## 2013-03-15 ENCOUNTER — Encounter (HOSPITAL_COMMUNITY): Payer: Self-pay

## 2013-03-15 ENCOUNTER — Emergency Department (INDEPENDENT_AMBULATORY_CARE_PROVIDER_SITE_OTHER)
Admission: EM | Admit: 2013-03-15 | Discharge: 2013-03-15 | Disposition: A | Discharge status: Home / Self Care | Payer: Self-pay | Source: Home / Self Care

## 2013-03-15 DIAGNOSIS — S60219A Contusion of unspecified wrist, initial encounter: Secondary | ICD-10-CM

## 2013-03-15 DIAGNOSIS — S60212A Contusion of left wrist, initial encounter: Secondary | ICD-10-CM

## 2013-03-15 MED ORDER — NAPROXEN 250 MG PO TABS: 250.0000 mg | ORAL_TABLET | Freq: Two times a day (BID) | ORAL | Status: DC

## 2013-03-15 MED ORDER — TRAMADOL HCL 50 MG PO TABS: 50.0000 mg | ORAL_TABLET | Freq: Four times a day (QID) | ORAL | Status: DC | PRN

## 2013-03-15 NOTE — ED Provider Notes (Signed)
History     CSN: [id number]  Arrival date & time 03/15/13  1155   First MD Initiated Contact with Patient 03/15/13 1435      Chief Complaint  Patient presents with  . Arm Pain    (Consider location/radiation/quality/duration/timing/severity/associated sxs/prior treatment) HPI Comments: 22 year old female presents with the complaint of pain in the left wrist. There was mild discomfort to begin yesterday however upon awakening this morning she noticed swelling, puffiness and ecchymosis to the lower aspect of the left wrist. This is associated with pain upon flexion of the wrist and digits. She also states that extending the fingers also produces pain in the wrist. The only event she is able to recall admit calls the problem is that when she was playing with friends a couple days ago and she "popped" her left thumb. Otherwise she is unable to recall trauma. She also denies repetitive work or use involving the left upper extremity.   Past Medical History  Diagnosis Date  . Thyroid disease     History reviewed. No pertinent past surgical history.  History reviewed. No pertinent family history.  History  Substance Use Topics  . Smoking status: Not on file  . Smokeless tobacco: Not on file  . Alcohol Use:     OB History   Grav Para Term Preterm Abortions TAB SAB Ect Mult Living                  Review of Systems  Constitutional: Negative for fever, chills and activity change.  HENT: Negative.   Respiratory: Negative.   Cardiovascular: Negative.   Musculoskeletal:       As per HPI  Skin: Negative for color change, pallor and rash.  Neurological: Negative.     Allergies  Codeine  Home Medications   Current Outpatient Rx  Name  Route  Sig  Dispense  Refill  . citalopram (CELEXA) 40 MG tablet   Oral   Take 40 mg by mouth daily.         Marland Kitchen FLUoxetine (PROZAC) 40 MG capsule   Oral   Take 40 mg by mouth daily.         Marland Kitchen levothyroxine (SYNTHROID, LEVOTHROID) 50  MCG tablet   Oral   Take 50 mcg by mouth daily.         . naproxen (NAPROSYN) 250 MG tablet   Oral   Take 1 tablet (250 mg total) by mouth 2 (two) times daily with a meal.   20 tablet   0   . ondansetron (ZOFRAN) 4 MG tablet   Oral   Take 4 mg by mouth every 8 (eight) hours as needed.         . traMADol (ULTRAM) 50 MG tablet   Oral   Take 1 tablet (50 mg total) by mouth every 6 (six) hours as needed for pain.   15 tablet   0   . traZODone (DESYREL) 100 MG tablet   Oral   Take 100 mg by mouth at bedtime.           BP 119/73  Pulse 72  Temp(Src) 97.7 F (36.5 C) (Oral)  Resp 16  SpO2 100%  LMP 03/12/2013  Physical Exam  Nursing note and vitals reviewed. Constitutional: She is oriented to person, place, and time. She appears well-developed and well-nourished. No distress.  HENT:  Head: Normocephalic and atraumatic.  Eyes: EOM are normal. Pupils are equal, round, and reactive to light.  Neck: Normal range of  motion. Neck supple.  Cardiovascular: Normal rate.   Pulmonary/Chest: Effort normal.  Musculoskeletal:  Inspection reveals ecchymosis and swelling over the left volar wrist in a linear pattern over the flexor carpi radialis tendons. There is also ecchymosis of the thenar eminence and base of the thumb. Demonstrating wrist and digit flexion produces pain in the wrist. Light Palpation of the wrist produces moderate tenderness. Palpation of the radius and ulna reveals no bony tenderness. Palpation of the thumb reveals no bony tenderness. Full range of motion of the thumb. Lourena Simmonds sign is positive. Capillary refill is brisk.  Neurological: She is alert and oriented to person, place, and time. No cranial nerve deficit.  Skin: Skin is warm and dry.  Psychiatric: She has a normal mood and affect.    ED Course  Procedures (including critical care time)  Labs Reviewed - No data to display No results found.   1. Wrist contusion, left, initial encounter        MDM  Wear the wrist splint for about one week. Moves the wrist and digits  occasionally to perform range of motion. Apply ice to the areas of bruising and soreness. Tramadol 50 mg every 6 hours when necessary pain Naprosyn 250 mg twice a day with food when necessary pain and inflammation. Return for any new symptoms problems worsening or not improved in 3-4 days.         Hayden Rasmussen, NP 03/15/13 1543  Hayden Rasmussen, NP 03/15/13 1544

## 2013-03-15 NOTE — ED Notes (Signed)
States she woke this AM w pain in her left forearm and wrist. Denies injury. Arm painful to palpation, worse w supination or pronation; good sensation in hand, good coloration in hand

## 2014-10-23 ENCOUNTER — Inpatient Hospital Stay (HOSPITAL_COMMUNITY)
Admission: AD | Admit: 2014-10-23 | Discharge: 2014-10-23 | Disposition: A | Discharge status: Home / Self Care | Payer: Medicaid Other | Source: Ambulatory Visit | Attending: Obstetrics and Gynecology | Admitting: Obstetrics and Gynecology

## 2014-10-23 ENCOUNTER — Encounter (HOSPITAL_COMMUNITY): Payer: Self-pay

## 2014-10-23 ENCOUNTER — Inpatient Hospital Stay (HOSPITAL_COMMUNITY): Payer: Medicaid Other

## 2014-10-23 DIAGNOSIS — Z87891 Personal history of nicotine dependence: Secondary | ICD-10-CM | POA: Diagnosis not present

## 2014-10-23 DIAGNOSIS — O210 Mild hyperemesis gravidarum: Secondary | ICD-10-CM | POA: Diagnosis present

## 2014-10-23 DIAGNOSIS — O360910 Maternal care for other rhesus isoimmunization, first trimester, not applicable or unspecified: Secondary | ICD-10-CM | POA: Diagnosis not present

## 2014-10-23 DIAGNOSIS — Z3A08 8 weeks gestation of pregnancy: Secondary | ICD-10-CM | POA: Insufficient documentation

## 2014-10-23 DIAGNOSIS — O219 Vomiting of pregnancy, unspecified: Secondary | ICD-10-CM

## 2014-10-23 DIAGNOSIS — O26891 Other specified pregnancy related conditions, first trimester: Secondary | ICD-10-CM | POA: Diagnosis present

## 2014-10-23 DIAGNOSIS — O209 Hemorrhage in early pregnancy, unspecified: Secondary | ICD-10-CM | POA: Diagnosis not present

## 2014-10-23 HISTORY — DX: Anxiety disorder, unspecified: F41.9

## 2014-10-23 HISTORY — DX: Hypothyroidism, unspecified: E03.9

## 2014-10-23 LAB — CBC WITH DIFFERENTIAL/PLATELET
BASOS ABS: 0 10*3/uL (ref 0.0–0.1)
Basophils Relative: 0 % (ref 0–1)
Eosinophils Absolute: 0.1 10*3/uL (ref 0.0–0.7)
Eosinophils Relative: 1 % (ref 0–5)
HEMATOCRIT: 37.5 % (ref 36.0–46.0)
HEMOGLOBIN: 12.8 g/dL (ref 12.0–15.0)
LYMPHS PCT: 16 % (ref 12–46)
Lymphs Abs: 1.6 10*3/uL (ref 0.7–4.0)
MCH: 29.7 pg (ref 26.0–34.0)
MCHC: 34.1 g/dL (ref 30.0–36.0)
MCV: 87 fL (ref 78.0–100.0)
MONO ABS: 1 10*3/uL (ref 0.1–1.0)
Monocytes Relative: 9 % (ref 3–12)
NEUTROS ABS: 7.4 10*3/uL (ref 1.7–7.7)
Neutrophils Relative %: 74 % (ref 43–77)
Platelets: 166 10*3/uL (ref 150–400)
RBC: 4.31 MIL/uL (ref 3.87–5.11)
RDW: 12.3 % (ref 11.5–15.5)
WBC: 10.1 10*3/uL (ref 4.0–10.5)

## 2014-10-23 LAB — WET PREP, GENITAL
CLUE CELLS WET PREP: NONE SEEN
TRICH WET PREP: NONE SEEN
Yeast Wet Prep HPF POC: NONE SEEN

## 2014-10-23 LAB — ABO/RH: ABO/RH(D): O NEG

## 2014-10-23 LAB — POCT PREGNANCY, URINE: PREG TEST UR: POSITIVE — AB

## 2014-10-23 LAB — URINALYSIS, ROUTINE W REFLEX MICROSCOPIC
Bilirubin Urine: NEGATIVE
Glucose, UA: NEGATIVE mg/dL
KETONES UR: NEGATIVE mg/dL
LEUKOCYTES UA: NEGATIVE
NITRITE: NEGATIVE
PH: 6 (ref 5.0–8.0)
PROTEIN: NEGATIVE mg/dL
Specific Gravity, Urine: 1.025 (ref 1.005–1.030)
Urobilinogen, UA: 0.2 mg/dL (ref 0.0–1.0)

## 2014-10-23 LAB — URINE MICROSCOPIC-ADD ON

## 2014-10-23 LAB — GC/CHLAMYDIA PROBE AMP (~~LOC~~) NOT AT ARMC
Chlamydia: NEGATIVE
NEISSERIA GONORRHEA: NEGATIVE

## 2014-10-23 LAB — HCG, QUANTITATIVE, PREGNANCY: hCG, Beta Chain, Quant, S: 198224 m[IU]/mL — ABNORMAL HIGH (ref <5)

## 2014-10-23 MED ORDER — HYDROMORPHONE HCL 1 MG/ML IJ SOLN
0.5000 mg | Freq: Once | INTRAMUSCULAR | Status: AC
  Administered 2014-10-23: 0.5 mg via INTRAMUSCULAR
  Filled 2014-10-23: qty 1

## 2014-10-23 MED ORDER — PROMETHAZINE HCL 25 MG PO TABS: 12.5000 mg | ORAL_TABLET | Freq: Once | ORAL | Status: DC

## 2014-10-23 MED ORDER — RHO D IMMUNE GLOBULIN 1500 UNIT/2ML IJ SOSY
300.0000 ug | PREFILLED_SYRINGE | Freq: Once | INTRAMUSCULAR | Status: AC
  Administered 2014-10-23: 300 ug via INTRAMUSCULAR
  Filled 2014-10-23: qty 2

## 2014-10-23 MED ORDER — PROMETHAZINE HCL 25 MG PO TABS: 25.0000 mg | ORAL_TABLET | Freq: Four times a day (QID) | ORAL | Status: DC | PRN

## 2014-10-23 MED ORDER — PROMETHAZINE HCL 25 MG/ML IJ SOLN
25.0000 mg | Freq: Once | INTRAVENOUS | Status: AC
  Administered 2014-10-23: 25 mg via INTRAVENOUS
  Filled 2014-10-23: qty 1

## 2014-10-23 NOTE — Discharge Instructions (Signed)
Eating Plan for Hyperemesis Gravidarum °Severe cases of hyperemesis gravidarum can lead to dehydration and malnutrition. The hyperemesis eating plan is one way to lessen the symptoms of nausea and vomiting. It is often used with prescribed medicines to control your symptoms.  °WHAT CAN I DO TO RELIEVE MY SYMPTOMS? °Listen to your body. Everyone is different and has different preferences. Find what works best for you. Some of the following things may help: °· Eat and drink slowly. °· Eat 5-6 small meals daily instead of 3 large meals.   °· Eat crackers before you get out of bed in the morning.   °· Starchy foods are usually well tolerated (such as cereal, toast, bread, potatoes, pasta, rice, and pretzels).   °· Ginger may help with nausea. Add ¼ tsp ground ginger to hot tea or choose ginger tea.   °· Try drinking 100% fruit juice or an electrolyte drink. °· Continue to take your prenatal vitamins as directed by your health care provider. If you are having trouble taking your prenatal vitamins, talk with your health care provider about different options. °· Include at least 1 serving of protein with your meals and snacks (such as meats or poultry, beans, nuts, eggs, or yogurt). Try eating a protein-rich snack before bed (such as cheese and crackers or a half turkey or peanut butter sandwich). °WHAT THINGS SHOULD I AVOID TO REDUCE MY SYMPTOMS? °The following things may help reduce your symptoms: °· Avoid foods with strong smells. Try eating meals in well-ventilated areas that are free of odors. °· Avoid drinking water or other beverages with meals. Try not to drink anything less than 30 minutes before and after meals. °· Avoid drinking more than 1 cup of fluid at a time. °· Avoid fried or high-fat foods, such as butter and cream sauces. °· Avoid spicy foods. °· Avoid skipping meals the best you can. Nausea can be more intense on an empty stomach. If you cannot tolerate food at that time, do not force it. Try sucking on  ice chips or other frozen items and make up the calories later. °· Avoid lying down within 2 hours after eating. °Document Released: 07/18/2007 Document Revised: 09/25/2013 Document Reviewed: 07/25/2013 °[employer]® Patient Information ©2015 [employer]. This information is not intended to replace advice given to you by your health care provider. Make sure you discuss any questions you have with your health care provider. ° °Morning Sickness °Morning sickness is when you feel sick to your stomach (nauseous) during pregnancy. This nauseous feeling may or may not come with vomiting. It often occurs in the morning but can be a problem any time of day. Morning sickness is most common during the first trimester, but it may continue throughout pregnancy. While morning sickness is unpleasant, it is usually harmless unless you develop severe and continual vomiting (hyperemesis gravidarum). This condition requires more intense treatment.  °CAUSES  °The cause of morning sickness is not completely known but seems to be related to normal hormonal changes that occur in pregnancy. °RISK FACTORS °You are at greater risk if you: °· Experienced nausea or vomiting before your pregnancy. °· Had morning sickness during a previous pregnancy. °· Are pregnant with more than one baby, such as twins. °TREATMENT  °Do not use any medicines (prescription, over-the-counter, or herbal) for morning sickness without first talking to your health care provider. Your health care provider may prescribe or recommend: °· Vitamin B6 supplements. °· Anti-nausea medicines. °· The herbal medicine ginger. °HOME CARE INSTRUCTIONS  °· Only take over-the-counter   or prescription medicines as directed by your health care provider.  Taking multivitamins before getting pregnant can prevent or decrease the severity of morning sickness in most women.  Eat a piece of dry toast or unsalted crackers before getting out of bed in the morning.  Eat five or six small  meals a day.  Eat dry and bland foods (rice, baked potato). Foods high in carbohydrates are often helpful.  Do not drink liquids with your meals. Drink liquids between meals.  Avoid greasy, fatty, and spicy foods.  Get someone to cook for you if the smell of any food causes nausea and vomiting.  If you feel nauseous after taking prenatal vitamins, take the vitamins at night or with a snack.  Snack on protein foods (nuts, yogurt, cheese) between meals if you are hungry.  Eat unsweetened gelatins for desserts.  Wearing an acupressure wristband (worn for sea sickness) may be helpful.  Acupuncture may be helpful.  Do not smoke.  Get a humidifier to keep the air in your house free of odors.  Get plenty of fresh air. SEEK MEDICAL CARE IF:   Your home remedies are not working, and you need medicine.  You feel dizzy or lightheaded.  You are losing weight. SEEK IMMEDIATE MEDICAL CARE IF:   You have persistent and uncontrolled nausea and vomiting.  You pass out (faint). MAKE SURE YOU:  Understand these instructions.  Will watch your condition.  Will get help right away if you are not doing well or get worse. Document Released: 11/11/2006 Document Revised: 09/25/2013 Document Reviewed: 03/07/2013 [facility] Patient Information 2015 [place], [place]. This information is not intended to replace advice given to you by your health care provider. Make sure you discuss any questions you have with your health care provider. Subchorionic Hematoma A subchorionic hematoma is a gathering of blood between the outer wall of the placenta and the inner wall of the womb (uterus). The placenta is the organ that connects the fetus to the wall of the uterus. The placenta performs the feeding, breathing (oxygen to the fetus), and waste removal (excretory work) of the fetus.  Subchorionic hematoma is the most common abnormality found on a result from ultrasonography done during the first trimester or  early second trimester of pregnancy. If there has been little or no vaginal bleeding, early small hematomas usually shrink on their own and do not affect your baby or pregnancy. The blood is gradually absorbed over 1-2 weeks. When bleeding starts later in pregnancy or the hematoma is larger or occurs in an older pregnant woman, the outcome may not be as good. Larger hematomas may get bigger, which increases the chances for miscarriage. Subchorionic hematoma also increases the risk of premature detachment of the placenta from the uterus, preterm (premature) labor, and stillbirth. HOME CARE INSTRUCTIONS  Stay on bed rest if your health care provider recommends this. Although bed rest will not prevent more bleeding or prevent a miscarriage, your health care provider may recommend bed rest until you are advised otherwise.  Avoid heavy lifting (more than 10 lb [4.5 kg]), exercise, sexual intercourse, or douching as directed by your health care provider.  Keep track of the number of pads you use each day and how soaked (saturated) they are. Write down this information.  Do not use tampons.  Keep all follow-up appointments as directed by your health care provider. Your health care provider may ask you to have follow-up blood tests or ultrasound tests or both. SEEK IMMEDIATE MEDICAL CARE  IF:  You have severe cramps in your stomach, back, abdomen, or pelvis.  You have a fever.  You pass large clots or tissue. Save any tissue for your health care provider to look at.  Your bleeding increases or you become lightheaded, feel weak, or have fainting episodes. Document Released: 01/05/2007 Document Revised: 02/04/2014 Document Reviewed: 04/19/2013 [facility] Patient Information 2015 [place], [place]. This information is not intended to replace advice given to you by your health care provider. Make sure you discuss any questions you have with your health care provider.

## 2014-10-23 NOTE — MAU Note (Signed)
Vaginal bleeding started before coming to MAU; red, no clots; not saturating pads. Abdominal cramping x 2 weeks. Denies vaginal discharge.

## 2014-10-23 NOTE — MAU Provider Note (Signed)
History     CSN: [id number]  Arrival date and time: 10/23/14 0344   First Provider Initiated Contact with Patient 10/23/14 0408      Chief Complaint  Patient presents with  . Emesis During Pregnancy  . Vaginal Bleeding  . Abdominal Pain   HPI  Ms. Christine Beard is a 24 y.o. G1P0 at [redacted]w[redacted]d who presents to MAU today with complaint of vaginal bleeding and lower abdominal cramping. She states that she had intercourse today at 0200. She noted vaginal bleeding started soon after. She states a moderate amount of bleeding prior to arrival in MAU. She rates her abdominal pain at 8/10 now. She denies other vaginal discharge, UTI symptoms, fever, diarrhea or constipation. She states N/V throughout the pregnancy.   OB History    Gravida Para Term Preterm AB TAB SAB Ectopic Multiple Living   1               Past Medical History  Diagnosis Date  . Thyroid disease   . Hypothyroidism   . Anxiety     Past Surgical History  Procedure Laterality Date  . Tonsillectomy    . Toe surgery      History reviewed. No pertinent family history.  History  Substance Use Topics  . Smoking status: Former Smoker    Quit date: 09/03/2014  . Smokeless tobacco: Never Used  . Alcohol Use: No    Allergies:  Allergies  Allergen Reactions  . Codeine Nausea And Vomiting    Prescriptions prior to admission  Medication Sig Dispense Refill Last Dose  . Prenatal Vit-Fe Fumarate-FA (PRENATAL MULTIVITAMIN) TABS tablet Take 1 tablet by mouth daily at 12 noon.   10/22/2014 at Unknown time  . ondansetron (ZOFRAN) 4 MG tablet Take 4 mg by mouth every 8 (eight) hours as needed.   Unknown at Unknown    Review of Systems  Constitutional: Negative for fever and malaise/fatigue.  Gastrointestinal: Positive for nausea, vomiting and abdominal pain. Negative for diarrhea and constipation.  Genitourinary: Negative for dysuria, urgency and frequency.       + vaginal bleeding Neg - vaginal discharge   Physical  Exam   Blood pressure 117/80, pulse 89, temperature 98.6 F (37 C), temperature source Oral, resp. rate 16, height  (1.448 m), weight 117 lb 9.6 oz (53.343 kg), last menstrual period 08/16/2014.  Physical Exam  Constitutional: She is oriented to person, place, and time. She appears well-developed and well-nourished. No distress.  HENT:  Head: Normocephalic.  Cardiovascular: Normal rate.   Respiratory: Effort normal.  GI: Soft. She exhibits no distension and no mass. There is tenderness (mild tenderness to palpation of the epigastric region). There is no rebound and no guarding.  Genitourinary: Uterus is enlarged (slightly) and tender (mild). Cervix exhibits no motion tenderness, no discharge and no friability. Right adnexum displays tenderness (mild). Right adnexum displays no mass. Left adnexum displays no mass and no tenderness. There is bleeding (small amount of brown blood noted in the vaginal vault) in the vagina. No vaginal discharge found.  Neurological: She is alert and oriented to person, place, and time.  Skin: Skin is warm and dry. No erythema.  Psychiatric: She has a normal mood and affect.  Cervix: closed, thick  Results for orders placed or performed during the hospital encounter of 10/23/14 (from the past 24 hour(s))  Urinalysis, Routine w reflex microscopic     Status: Abnormal   Collection Time: 10/23/14  3:54 AM  Result Value Ref  Range   Color, Urine YELLOW YELLOW   APPearance CLEAR CLEAR   Specific Gravity, Urine 1.025 1.005 - 1.030   pH 6.0 5.0 - 8.0   Glucose, UA NEGATIVE NEGATIVE mg/dL   Hgb urine dipstick LARGE (A) NEGATIVE   Bilirubin Urine NEGATIVE NEGATIVE   Ketones, ur NEGATIVE NEGATIVE mg/dL   Protein, ur NEGATIVE NEGATIVE mg/dL   Urobilinogen, UA 0.2 0.0 - 1.0 mg/dL   Nitrite NEGATIVE NEGATIVE   Leukocytes, UA NEGATIVE NEGATIVE  Urine microscopic-add on     Status: None   Collection Time: 10/23/14  3:54 AM  Result Value Ref Range   Squamous  Epithelial / LPF RARE RARE   WBC, UA 0-2 <3 WBC/hpf   RBC / HPF 3-6 <3 RBC/hpf   Bacteria, UA RARE RARE   Urine-Other MUCOUS PRESENT   Pregnancy, urine POC     Status: Abnormal   Collection Time: 10/23/14  4:01 AM  Result Value Ref Range   Preg Test, Ur POSITIVE (A) NEGATIVE  Wet prep, genital     Status: Abnormal   Collection Time: 10/23/14  4:31 AM  Result Value Ref Range   Yeast Wet Prep HPF POC NONE SEEN NONE SEEN   Trich, Wet Prep NONE SEEN NONE SEEN   Clue Cells Wet Prep HPF POC NONE SEEN NONE SEEN   WBC, Wet Prep HPF POC FEW (A) NONE SEEN  CBC with Differential     Status: None   Collection Time: 10/23/14  4:38 AM  Result Value Ref Range   WBC 10.1 4.0 - 10.5 K/uL   RBC 4.31 3.87 - 5.11 MIL/uL   Hemoglobin 12.8 12.0 - 15.0 g/dL   HCT 16.137.5 09.636.0 - 04.546.0 %   MCV 87.0 78.0 - 100.0 fL   MCH 29.7 26.0 - 34.0 pg   MCHC 34.1 30.0 - 36.0 g/dL   RDW 40.912.3 81.111.5 - 91.415.5 %   Platelets 166 150 - 400 K/uL   Neutrophils Relative % 74 43 - 77 %   Neutro Abs 7.4 1.7 - 7.7 K/uL   Lymphocytes Relative 16 12 - 46 %   Lymphs Abs 1.6 0.7 - 4.0 K/uL   Monocytes Relative 9 3 - 12 %   Monocytes Absolute 1.0 0.1 - 1.0 K/uL   Eosinophils Relative 1 0 - 5 %   Eosinophils Absolute 0.1 0.0 - 0.7 K/uL   Basophils Relative 0 0 - 1 %   Basophils Absolute 0.0 0.0 - 0.1 K/uL  ABO/Rh     Status: None   Collection Time: 10/23/14  4:38 AM  Result Value Ref Range   ABO/RH(D) O NEG   hCG, quantitative, pregnancy     Status: Abnormal   Collection Time: 10/23/14  4:38 AM  Result Value Ref Range   hCG, Beta Chain, Quant, S [id number] (H) <5 mIU/mL  Rh IG workup (includes ABO/Rh)     Status: None (Preliminary result)   Collection Time: 10/23/14  4:38 AM  Result Value Ref Range   Gestational Age(Wks) 8    ABO/RH(D) O NEG    Antibody Screen NEG    Unit Number [id number]    Blood Component Type RHIG    Unit division 00    Status of Unit ISSUED    Transfusion Status OK TO TRANSFUSE    [place] Ob Comp Less 14  Wks  10/23/2014   CLINICAL DATA:  Vaginal bleeding in first trimester pregnancy  EXAM: OBSTETRIC <14 WK US AND TRANSVAGINAL OB US  TECHNIQUE: Both  transabdominal and transvaginal ultrasound examinations were performed for complete evaluation of the gestation as well as the maternal uterus, adnexal regions, and pelvic cul-de-sac. Transvaginal technique was performed to assess early pregnancy.  COMPARISON:  None of this pregnancy  FINDINGS: Intrauterine gestational sac: Present. There is a small sub chronic hemorrhage along the lower margin of the gestational sac. The blood encompasses a 16 mm circumference of the gestational sac and measures no more than 4 mm thickness.  Yolk sac:  Present  Embryo:  Present  Cardiac Activity: Present  Heart Rate:  172 bpm  CRL:   19.5  mm   8 w 4 d                  [place] EDC: 05/31/2015  Maternal uterus/adnexae: The ovaries are unremarkable. There is no free pelvic fluid.  IMPRESSION: 1. Single, living intrauterine gestation with estimated age 97 weeks 4 days. 2. Small subchorionic hemorrhage around the lower gestational sac.   Electronically Signed   By: Tiburcio Pea M.D.   On: 10/23/2014 05:39   US Ob Transvaginal  10/23/2014   CLINICAL DATA:  Vaginal bleeding in first trimester pregnancy  EXAM: OBSTETRIC <14 WK [place] AND TRANSVAGINAL OB US  TECHNIQUE: Both transabdominal and transvaginal ultrasound examinations were performed for complete evaluation of the gestation as well as the maternal uterus, adnexal regions, and pelvic cul-de-sac. Transvaginal technique was performed to assess early pregnancy.  COMPARISON:  None of this pregnancy  FINDINGS: Intrauterine gestational sac: Present. There is a small sub chronic hemorrhage along the lower margin of the gestational sac. The blood encompasses a 16 mm circumference of the gestational sac and measures no more than 4 mm thickness.  Yolk sac:  Present  Embryo:  Present  Cardiac Activity: Present  Heart Rate:  172 bpm  CRL:   19.5  mm    8 w 4 d                  [place] EDC: 05/31/2015  Maternal uterus/adnexae: The ovaries are unremarkable. There is no free pelvic fluid.  IMPRESSION: 1. Single, living intrauterine gestation with estimated age 97 weeks 4 days. 2. Small subchorionic hemorrhage around the lower gestational sac.   Electronically Signed   By: Tiburcio Pea M.D.   On: 10/23/2014 05:39    MAU Course  Procedures None  MDM +UPT UA, wet prep, GC/Chlamydia, CBC, ABO/Rh, quant hCG and [place] today 0.5 mg Dilaudid IM given Patient returned from [place] with complaint of increased nausea Patient unsure if she will tolerate PO medication, begins actively vomiting while I am in the room to re-assess her condition 1 liter IV LR with 25 mg Phenergan infusion given - patient reports improvement in nausea. No additional emesis while in MAU.  Rhogam work-up and injection ordered  Assessment and Plan  A: SIUP at [redacted]w[redacted]d Small subchorionic hemorrhage Nausea and vomiting in pregnancy prior to [redacted] weeks gestation Rh negative  P: Discharge home Rx for Phenergan given to patient Rhogam given in MAU today  Bleeding precautions and first trimester warning signs reviewed AVS contains information about diet for N/V of pregnancy Patient advised to start prenatal care when Medicaid is active Patient may return to MAU as needed or if her condition were to change or worsen   Marny Lowenstein, PA-C  10/23/2014, 7:06 AM

## 2014-10-24 LAB — RH IG WORKUP (INCLUDES ABO/RH)
ABO/RH(D): O NEG
Antibody Screen: NEGATIVE
Gestational Age(Wks): 8
UNIT DIVISION: 0

## 2014-10-24 LAB — RPR: RPR: NONREACTIVE

## 2014-10-24 LAB — HIV ANTIBODY (ROUTINE TESTING W REFLEX)
HIV 1/O/2 Abs-Index Value: <1 (ref <1.00)
HIV-1/HIV-2 Ab: NONREACTIVE

## 2014-12-02 LAB — OB RESULTS CONSOLE ABO/RH: RH TYPE: NEGATIVE

## 2014-12-02 LAB — OB RESULTS CONSOLE HEPATITIS B SURFACE ANTIGEN: HEP B S AG: NEGATIVE

## 2014-12-02 LAB — OB RESULTS CONSOLE ANTIBODY SCREEN: ANTIBODY SCREEN: POSITIVE

## 2014-12-02 LAB — OB RESULTS CONSOLE GC/CHLAMYDIA
Chlamydia: NEGATIVE
Gonorrhea: NEGATIVE

## 2014-12-02 LAB — OB RESULTS CONSOLE RPR: RPR: NONREACTIVE

## 2014-12-02 LAB — OB RESULTS CONSOLE HIV ANTIBODY (ROUTINE TESTING): HIV: NONREACTIVE

## 2014-12-02 LAB — OB RESULTS CONSOLE RUBELLA ANTIBODY, IGM: Rubella: NON-IMMUNE/NOT IMMUNE

## 2015-05-22 ENCOUNTER — Telehealth (HOSPITAL_COMMUNITY): Payer: Self-pay | Admitting: *Deleted

## 2015-05-22 ENCOUNTER — Encounter (HOSPITAL_COMMUNITY): Payer: Self-pay | Admitting: *Deleted

## 2015-05-22 NOTE — Telephone Encounter (Signed)
Preadmission screen  

## 2015-05-26 ENCOUNTER — Inpatient Hospital Stay (HOSPITAL_COMMUNITY)
Admission: AD | Admit: 2015-05-26 | Discharge: 2015-05-26 | Disposition: A | Discharge status: Home / Self Care | Payer: Medicaid Other | Source: Ambulatory Visit | Attending: Obstetrics and Gynecology | Admitting: Obstetrics and Gynecology

## 2015-05-26 ENCOUNTER — Encounter (HOSPITAL_COMMUNITY): Payer: Self-pay

## 2015-05-26 MED ORDER — HYDROXYZINE HCL 50 MG/ML IM SOLN
50.0000 mg | Freq: Once | INTRAMUSCULAR | Status: AC
  Administered 2015-05-26: 50 mg via INTRAMUSCULAR
  Filled 2015-05-26: qty 1

## 2015-05-26 NOTE — Progress Notes (Signed)
Report called Dr. Mindi Slicker. Will recheck at 1 hour mark and discharge home if no change. Can give pt  of vistaril.

## 2015-05-26 NOTE — Progress Notes (Signed)
Message left regarding pt in [facility]

## 2015-05-26 NOTE — Discharge Instructions (Signed)
Third Trimester of Pregnancy °The third trimester is from week 29 through week 42, months 7 through 9. The third trimester is a time when the fetus is growing rapidly. At the end of the ninth month, the fetus is about 20 inches in length and weighs 6-10 pounds.  °BODY CHANGES °Your body goes through many changes during pregnancy. The changes vary from woman to woman.  °· Your weight will continue to increase. You can expect to gain 25-35 pounds (11-16 kg) by the end of the pregnancy. °· You may begin to get stretch marks on your hips, abdomen, and breasts. °· You may urinate more often because the fetus is moving lower into your pelvis and pressing on your bladder. °· You may develop or continue to have heartburn as a result of your pregnancy. °· You may develop constipation because certain hormones are causing the muscles that push waste through your intestines to slow down. °· You may develop hemorrhoids or swollen, bulging veins (varicose veins). °· You may have pelvic pain because of the weight gain and pregnancy hormones relaxing your joints between the bones in your pelvis. Backaches may result from overexertion of the muscles supporting your posture. °· You may have changes in your hair. These can include thickening of your hair, rapid growth, and changes in texture. Some women also have hair loss during or after pregnancy, or hair that feels dry or thin. Your hair will most likely return to normal after your baby is born. °· Your breasts will continue to grow and be tender. A yellow discharge may leak from your breasts called colostrum. °· Your belly button may stick out. °· You may feel short of breath because of your expanding uterus. °· You may notice the fetus "dropping," or moving lower in your abdomen. °· You may have a bloody mucus discharge. This usually occurs a few days to a week before labor begins. °· Your cervix becomes thin and soft (effaced) near your due date. °WHAT TO EXPECT AT YOUR PRENATAL  EXAMS  °You will have prenatal exams every 2 weeks until week 36. Then, you will have weekly prenatal exams. During a routine prenatal visit: °· You will be weighed to make sure you and the fetus are growing normally. °· Your blood pressure is taken. °· Your abdomen will be measured to track your baby's growth. °· The fetal heartbeat will be listened to. °· Any test results from the previous visit will be discussed. °· You may have a cervical check near your due date to see if you have effaced. °At around 36 weeks, your caregiver will check your cervix. At the same time, your caregiver will also perform a test on the secretions of the vaginal tissue. This test is to determine if a type of bacteria, Group B streptococcus, is present. Your caregiver will explain this further. °Your caregiver may ask you: °· What your birth plan is. °· How you are feeling. °· If you are feeling the baby move. °· If you have had any abnormal symptoms, such as leaking fluid, bleeding, severe headaches, or abdominal cramping. °· If you have any questions. °Other tests or screenings that may be performed during your third trimester include: °· Blood tests that check for low iron levels (anemia). °· Fetal testing to check the health, activity level, and growth of the fetus. Testing is done if you have certain medical conditions or if there are problems during the pregnancy. °FALSE LABOR °You may feel small, irregular contractions that   eventually go away. These are called Braxton Hicks contractions, or false labor. Contractions may last for hours, days, or even weeks before true labor sets in. If contractions come at regular intervals, intensify, or become painful, it is best to be seen by your caregiver.  °SIGNS OF LABOR  °· Menstrual-like cramps. °· Contractions that are 5 minutes apart or less. °· Contractions that start on the top of the uterus and spread down to the lower abdomen and back. °· A sense of increased pelvic pressure or back  pain. °· A watery or bloody mucus discharge that comes from the vagina. °If you have any of these signs before the 37th week of pregnancy, call your caregiver right away. You need to go to the hospital to get checked immediately. °HOME CARE INSTRUCTIONS  °· Avoid all smoking, herbs, alcohol, and unprescribed drugs. These chemicals affect the formation and growth of the baby. °· Follow your caregiver's instructions regarding medicine use. There are medicines that are either safe or unsafe to take during pregnancy. °· Exercise only as directed by your caregiver. Experiencing uterine cramps is a good sign to stop exercising. °· Continue to eat regular, healthy meals. °· Wear a good support bra for breast tenderness. °· Do not use hot tubs, steam rooms, or saunas. °· Wear your seat belt at all times when driving. °· Avoid raw meat, uncooked cheese, cat litter boxes, and soil used by cats. These carry germs that can cause birth defects in the baby. °· Take your prenatal vitamins. °· Try taking a stool softener (if your caregiver approves) if you develop constipation. Eat more high-fiber foods, such as fresh vegetables or fruit and whole grains. Drink plenty of fluids to keep your urine clear or pale yellow. °· Take warm sitz baths to soothe any pain or discomfort caused by hemorrhoids. Use hemorrhoid cream if your caregiver approves. °· If you develop varicose veins, wear support hose. Elevate your feet for 15 minutes, 3-4 times a day. Limit salt in your diet. °· Avoid heavy lifting, wear low heal shoes, and practice good posture. °· Rest a lot with your legs elevated if you have leg cramps or low back pain. °· Visit your dentist if you have not gone during your pregnancy. Use a soft toothbrush to brush your teeth and be gentle when you floss. °· A sexual relationship may be continued unless your caregiver directs you otherwise. °· Do not travel far distances unless it is absolutely necessary and only with the approval  of your caregiver. °· Take prenatal classes to understand, practice, and ask questions about the labor and delivery. °· Make a trial run to the hospital. °· Pack your hospital bag. °· Prepare the baby's nursery. °· Continue to go to all your prenatal visits as directed by your caregiver. °SEEK MEDICAL CARE IF: °· You are unsure if you are in labor or if your water has broken. °· You have dizziness. °· You have mild pelvic cramps, pelvic pressure, or nagging pain in your abdominal area. °· You have persistent nausea, vomiting, or diarrhea. °· You have a bad smelling vaginal discharge. °· You have pain with urination. °SEEK IMMEDIATE MEDICAL CARE IF:  °· You have a fever. °· You are leaking fluid from your vagina. °· You have spotting or bleeding from your vagina. °· You have severe abdominal cramping or pain. °· You have rapid weight loss or gain. °· You have shortness of breath with chest pain. °· You notice sudden or extreme swelling   of your face, hands, ankles, feet, or legs. °· You have not felt your baby move in over an hour. °· You have severe headaches that do not go away with medicine. °· You have vision changes. °Document Released: 09/14/2001 Document Revised: 09/25/2013 Document Reviewed: 11/21/2012 °[employer]® Patient Information ©2015 [employer]. This information is not intended to replace advice given to you by your health care provider. Make sure you discuss any questions you have with your health care provider. °Fetal Movement Counts °Patient Name: __________________________________________________ Patient Due Date: ____________________ °Performing a fetal movement count is highly recommended in high-risk pregnancies, but it is good for every pregnant woman to do. Your health care provider may ask you to start counting fetal movements at 28 weeks of the pregnancy. Fetal movements often increase: °· After eating a full meal. °· After physical activity. °· After eating or drinking something sweet or  cold. °· At rest. °Pay attention to when you feel the baby is most active. This will help you notice a pattern of your baby's sleep and wake cycles and what factors contribute to an increase in fetal movement. It is important to perform a fetal movement count at the same time each day when your baby is normally most active.  °HOW TO COUNT FETAL MOVEMENTS °· Find a quiet and comfortable area to sit or lie down on your left side. Lying on your left side provides the best blood and oxygen circulation to your baby. °· Write down the day and time on a sheet of paper or in a journal. °· Start counting kicks, flutters, swishes, rolls, or jabs in a 2-hour period. You should feel at least 10 movements within 2 hours. °· If you do not feel 10 movements in 2 hours, wait 2-3 hours and count again. Look for a change in the pattern or not enough counts in 2 hours. °SEEK MEDICAL CARE IF: °· You feel less than 10 counts in 2 hours, tried twice. °· There is no movement in over an hour. °· The pattern is changing or taking longer each day to reach 10 counts in 2 hours. °· You feel the baby is not moving as he or she usually does. °Date: ____________ Movements: ____________ Start time: ____________ Finish time: ____________  °Date: ____________ Movements: ____________ Start time: ____________ Finish time: ____________ °Date: ____________ Movements: ____________ Start time: ____________ Finish time: ____________ °Date: ____________ Movements: ____________ Start time: ____________ Finish time: ____________ °Date: ____________ Movements: ____________ Start time: ____________ Finish time: ____________ °Date: ____________ Movements: ____________ Start time: ____________ Finish time: ____________ °Date: ____________ Movements: ____________ Start time: ____________ Finish time: ____________ °Date: ____________ Movements: ____________ Start time: ____________ Finish time: ____________  °Date: ____________ Movements: ____________ Start time:  ____________ Finish time: ____________ °Date: ____________ Movements: ____________ Start time: ____________ Finish time: ____________ °Date: ____________ Movements: ____________ Start time: ____________ Finish time: ____________ °Date: ____________ Movements: ____________ Start time: ____________ Finish time: ____________ °Date: ____________ Movements: ____________ Start time: ____________ Finish time: ____________ °Date: ____________ Movements: ____________ Start time: ____________ Finish time: ____________ °Date: ____________ Movements: ____________ Start time: ____________ Finish time: ____________  °Date: ____________ Movements: ____________ Start time: ____________ Finish time: ____________ °Date: ____________ Movements: ____________ Start time: ____________ Finish time: ____________ °Date: ____________ Movements: ____________ Start time: ____________ Finish time: ____________ °Date: ____________ Movements: ____________ Start time: ____________ Finish time: ____________ °Date: ____________ Movements: ____________ Start time: ____________ Finish time: ____________ °Date: ____________ Movements: ____________ Start time: ____________ Finish time: ____________ °Date: ____________ Movements: ____________ Start time: ____________ Finish time:   ____________  °Date: ____________ Movements: ____________ Start time: ____________ Finish time: ____________ °Date: ____________ Movements: ____________ Start time: ____________ Finish time: ____________ °Date: ____________ Movements: ____________ Start time: ____________ Finish time: ____________ °Date: ____________ Movements: ____________ Start time: ____________ Finish time: ____________ °Date: ____________ Movements: ____________ Start time: ____________ Finish time: ____________ °Date: ____________ Movements: ____________ Start time: ____________ Finish time: ____________ °Date: ____________ Movements: ____________ Start time: ____________ Finish time: ____________  °Date:  ____________ Movements: ____________ Start time: ____________ Finish time: ____________ °Date: ____________ Movements: ____________ Start time: ____________ Finish time: ____________ °Date: ____________ Movements: ____________ Start time: ____________ Finish time: ____________ °Date: ____________ Movements: ____________ Start time: ____________ Finish time: ____________ °Date: ____________ Movements: ____________ Start time: ____________ Finish time: ____________ °Date: ____________ Movements: ____________ Start time: ____________ Finish time: ____________ °Date: ____________ Movements: ____________ Start time: ____________ Finish time: ____________  °Date: ____________ Movements: ____________ Start time: ____________ Finish time: ____________ °Date: ____________ Movements: ____________ Start time: ____________ Finish time: ____________ °Date: ____________ Movements: ____________ Start time: ____________ Finish time: ____________ °Date: ____________ Movements: ____________ Start time: ____________ Finish time: ____________ °Date: ____________ Movements: ____________ Start time: ____________ Finish time: ____________ °Date: ____________ Movements: ____________ Start time: ____________ Finish time: ____________ °Date: ____________ Movements: ____________ Start time: ____________ Finish time: ____________  °Date: ____________ Movements: ____________ Start time: ____________ Finish time: ____________ °Date: ____________ Movements: ____________ Start time: ____________ Finish time: ____________ °Date: ____________ Movements: ____________ Start time: ____________ Finish time: ____________ °Date: ____________ Movements: ____________ Start time: ____________ Finish time: ____________ °Date: ____________ Movements: ____________ Start time: ____________ Finish time: ____________ °Date: ____________ Movements: ____________ Start time: ____________ Finish time: ____________ °Date: ____________ Movements: ____________ Start  time: ____________ Finish time: ____________  °Date: ____________ Movements: ____________ Start time: ____________ Finish time: ____________ °Date: ____________ Movements: ____________ Start time: ____________ Finish time: ____________ °Date: ____________ Movements: ____________ Start time: ____________ Finish time: ____________ °Date: ____________ Movements: ____________ Start time: ____________ Finish time: ____________ °Date: ____________ Movements: ____________ Start time: ____________ Finish time: ____________ °Date: ____________ Movements: ____________ Start time: ____________ Finish time: ____________ °Document Released: 10/20/2006 Document Revised: 02/04/2014 Document Reviewed: 07/17/2012 °[employer]® Patient Information ©2015 [employer]. This information is not intended to replace advice given to you by your health care provider. Make sure you discuss any questions you have with your health care provider. °Braxton Hicks Contractions °Contractions of the uterus can occur throughout pregnancy. Contractions are not always a sign that you are in labor.  °WHAT ARE BRAXTON HICKS CONTRACTIONS?  °Contractions that occur before labor are called Braxton Hicks contractions, or false labor. Toward the end of pregnancy (32-34 weeks), these contractions can develop more often and may become more forceful. This is not true labor because these contractions do not result in opening (dilatation) and thinning of the cervix. They are sometimes difficult to tell apart from true labor because these contractions can be forceful and people have different pain tolerances. You should not feel embarrassed if you go to the hospital with false labor. Sometimes, the only way to tell if you are in true labor is for your health care provider to look for changes in the cervix. °If there are no prenatal problems or other health problems associated with the pregnancy, it is completely safe to be sent home with false labor and await the  onset of true labor. °HOW CAN YOU TELL THE DIFFERENCE BETWEEN TRUE AND FALSE LABOR? °False Labor °· The contractions of false labor are usually shorter and not as hard as those of true labor.   °· The contractions   are usually irregular.   °· The contractions are often felt in the front of the lower abdomen and in the groin.   °· The contractions may go away when you walk around or change positions while lying down.   °· The contractions get weaker and are shorter lasting as time goes on.   °· The contractions do not usually become progressively stronger, regular, and closer together as with true labor.   °True Labor °· Contractions in true labor last 30-70 seconds, become very regular, usually become more intense, and increase in frequency.   °· The contractions do not go away with walking.   °· The discomfort is usually felt in the top of the uterus and spreads to the lower abdomen and low back.   °· True labor can be determined by your health care provider with an exam. This will show that the cervix is dilating and getting thinner.   °WHAT TO REMEMBER °· Keep up with your usual exercises and follow other instructions given by your health care provider.   °· Take medicines as directed by your health care provider.   °· Keep your regular prenatal appointments.   °· Eat and drink lightly if you think you are going into labor.   °· If Braxton Hicks contractions are making you uncomfortable:   °· Change your position from lying down or resting to walking, or from walking to resting.   °· Sit and rest in a tub of warm water.   °· Drink 2-3 glasses of water. Dehydration may cause these contractions.   °· Do slow and deep breathing several times an hour.   °WHEN SHOULD I SEEK IMMEDIATE MEDICAL CARE? °Seek immediate medical care if: °· Your contractions become stronger, more regular, and closer together.   °· You have fluid leaking or gushing from your vagina.   °· You have a fever.   °· You pass blood-tinged mucus.    °· You have vaginal bleeding.   °· You have continuous abdominal pain.   °· You have low back pain that you never had before.   °· You feel your baby's head pushing down and causing pelvic pressure.   °· Your baby is not moving as much as it used to.   °Document Released: 09/20/2005 Document Revised: 09/25/2013 Document Reviewed: 07/02/2013 °[employer]® Patient Information ©2015 [employer]. This information is not intended to replace advice given to you by your health care provider. Make sure you discuss any questions you have with your health care provider. ° °

## 2015-05-26 NOTE — MAU Note (Signed)
Pt presents complaining of contractions every 5 minutes. Denies bleeding or leaking of fluid. States she was seen in the office today and was 3cm. Dr. Mindi Slicker told her to go home and walk and come to [facility]U tonight to be rechecked. Pt states Dr. Mindi Slicker told her if she was 4cm, then she would break her water. Reports good fetal movement.

## 2015-05-27 ENCOUNTER — Inpatient Hospital Stay (HOSPITAL_COMMUNITY): Payer: Medicaid Other | Admitting: Anesthesiology

## 2015-05-27 ENCOUNTER — Encounter (HOSPITAL_COMMUNITY): Payer: Self-pay | Admitting: *Deleted

## 2015-05-27 ENCOUNTER — Inpatient Hospital Stay (HOSPITAL_COMMUNITY)
Admission: AD | Admit: 2015-05-27 | Discharge: 2015-05-29 | DRG: 775 | Disposition: A | Discharge status: Home / Self Care | Payer: Medicaid Other | Source: Ambulatory Visit | Attending: Obstetrics and Gynecology | Admitting: Obstetrics and Gynecology

## 2015-05-27 DIAGNOSIS — O4200 Premature rupture of membranes, onset of labor within 24 hours of rupture, unspecified weeks of gestation: Secondary | ICD-10-CM

## 2015-05-27 DIAGNOSIS — O4202 Full-term premature rupture of membranes, onset of labor within 24 hours of rupture: Secondary | ICD-10-CM | POA: Diagnosis present

## 2015-05-27 DIAGNOSIS — Z87891 Personal history of nicotine dependence: Secondary | ICD-10-CM | POA: Diagnosis not present

## 2015-05-27 DIAGNOSIS — O99284 Endocrine, nutritional and metabolic diseases complicating childbirth: Secondary | ICD-10-CM | POA: Diagnosis present

## 2015-05-27 DIAGNOSIS — O365990 Maternal care for other known or suspected poor fetal growth, unspecified trimester, not applicable or unspecified: Secondary | ICD-10-CM

## 2015-05-27 DIAGNOSIS — F329 Major depressive disorder, single episode, unspecified: Secondary | ICD-10-CM | POA: Diagnosis present

## 2015-05-27 DIAGNOSIS — Z8249 Family history of ischemic heart disease and other diseases of the circulatory system: Secondary | ICD-10-CM

## 2015-05-27 DIAGNOSIS — O99344 Other mental disorders complicating childbirth: Secondary | ICD-10-CM | POA: Diagnosis present

## 2015-05-27 DIAGNOSIS — Z808 Family history of malignant neoplasm of other organs or systems: Secondary | ICD-10-CM | POA: Diagnosis not present

## 2015-05-27 DIAGNOSIS — Z3A39 39 weeks gestation of pregnancy: Secondary | ICD-10-CM | POA: Diagnosis present

## 2015-05-27 DIAGNOSIS — E039 Hypothyroidism, unspecified: Secondary | ICD-10-CM | POA: Diagnosis present

## 2015-05-27 DIAGNOSIS — Z8049 Family history of malignant neoplasm of other genital organs: Secondary | ICD-10-CM | POA: Diagnosis not present

## 2015-05-27 DIAGNOSIS — F419 Anxiety disorder, unspecified: Secondary | ICD-10-CM | POA: Diagnosis present

## 2015-05-27 DIAGNOSIS — Z833 Family history of diabetes mellitus: Secondary | ICD-10-CM

## 2015-05-27 DIAGNOSIS — O4290 Premature rupture of membranes, unspecified as to length of time between rupture and onset of labor, unspecified weeks of gestation: Secondary | ICD-10-CM | POA: Diagnosis present

## 2015-05-27 LAB — CBC
HCT: 41.1 % (ref 36.0–46.0)
HCT: 37.7 % (ref 36.0–46.0)
HEMOGLOBIN: 14.1 g/dL (ref 12.0–15.0)
Hemoglobin: 12.7 g/dL (ref 12.0–15.0)
MCH: 30.3 pg (ref 26.0–34.0)
MCH: 29.7 pg (ref 26.0–34.0)
MCHC: 34.3 g/dL (ref 30.0–36.0)
MCHC: 33.7 g/dL (ref 30.0–36.0)
MCV: 88.4 fL (ref 78.0–100.0)
MCV: 88.3 fL (ref 78.0–100.0)
Platelets: 113 10*3/uL — ABNORMAL LOW (ref 150–400)
Platelets: 114 10*3/uL — ABNORMAL LOW (ref 150–400)
RBC: 4.65 MIL/uL (ref 3.87–5.11)
RBC: 4.27 MIL/uL (ref 3.87–5.11)
RDW: 14.8 % (ref 11.5–15.5)
RDW: 15.1 % (ref 11.5–15.5)
WBC: 25 10*3/uL — ABNORMAL HIGH (ref 4.0–10.5)
WBC: 19.1 10*3/uL — ABNORMAL HIGH (ref 4.0–10.5)

## 2015-05-27 LAB — TYPE AND SCREEN
ABO/RH(D): O NEG
Antibody Screen: NEGATIVE

## 2015-05-27 LAB — RPR: RPR: NONREACTIVE

## 2015-05-27 MED ORDER — LIDOCAINE HCL (PF) 1 % IJ SOLN
30.0000 mL | INTRAMUSCULAR | Status: AC | PRN
  Administered 2015-05-27: 30 mL via SUBCUTANEOUS
  Filled 2015-05-27 (×2): qty 30

## 2015-05-27 MED ORDER — ONDANSETRON HCL 4 MG PO TABS: 4.0000 mg | ORAL_TABLET | ORAL | Status: DC | PRN

## 2015-05-27 MED ORDER — FLEET ENEMA 7-19 GM/118ML RE ENEM: 1.0000 | ENEMA | RECTAL | Status: DC | PRN

## 2015-05-27 MED ORDER — SENNOSIDES-DOCUSATE SODIUM 8.6-50 MG PO TABS
2.0000 | ORAL_TABLET | ORAL | Status: DC
  Administered 2015-05-28 (×2): 2 via ORAL
  Filled 2015-05-27 (×2): qty 2

## 2015-05-27 MED ORDER — WITCH HAZEL-GLYCERIN EX PADS: 1.0000 "application " | MEDICATED_PAD | CUTANEOUS | Status: DC | PRN

## 2015-05-27 MED ORDER — PHENYLEPHRINE 40 MCG/ML (10ML) SYRINGE FOR IV PUSH (FOR BLOOD PRESSURE SUPPORT)
80.0000 ug | PREFILLED_SYRINGE | INTRAVENOUS | Status: DC | PRN
  Filled 2015-05-27: qty 2
  Filled 2015-05-27: qty 20

## 2015-05-27 MED ORDER — PRENATAL MULTIVITAMIN CH: 1.0000 | ORAL_TABLET | Freq: Every day | ORAL | Status: DC

## 2015-05-27 MED ORDER — ACETAMINOPHEN 325 MG PO TABS
650.0000 mg | ORAL_TABLET | ORAL | Status: DC | PRN
  Administered 2015-05-28: 650 mg via ORAL
  Filled 2015-05-27: qty 2

## 2015-05-27 MED ORDER — MEASLES, MUMPS & RUBELLA VAC ~~LOC~~ INJ
0.5000 mL | INJECTION | Freq: Once | SUBCUTANEOUS | Status: AC
  Administered 2015-05-29: 0.5 mL via SUBCUTANEOUS
  Filled 2015-05-27: qty 0.5

## 2015-05-27 MED ORDER — DIPHENHYDRAMINE HCL 50 MG/ML IJ SOLN: 12.5000 mg | INTRAMUSCULAR | Status: DC | PRN

## 2015-05-27 MED ORDER — LACTATED RINGERS IV SOLN
INTRAVENOUS | Status: DC
  Administered 2015-05-27: 07:00:00 via INTRAVENOUS
  Administered 2015-05-27: 125 mL/h via INTRAVENOUS

## 2015-05-27 MED ORDER — FENTANYL 2.5 MCG/ML BUPIVACAINE 1/10 % EPIDURAL INFUSION (WH - ANES)
14.0000 mL/h | INTRAMUSCULAR | Status: DC | PRN
  Administered 2015-05-27: 14 mL/h via EPIDURAL
  Administered 2015-05-27: 11.5 mL/h via EPIDURAL
  Filled 2015-05-27: qty 125

## 2015-05-27 MED ORDER — OXYCODONE-ACETAMINOPHEN 5-325 MG PO TABS
1.0000 | ORAL_TABLET | ORAL | Status: DC | PRN
  Administered 2015-05-27: 1 via ORAL
  Filled 2015-05-27: qty 1

## 2015-05-27 MED ORDER — OXYCODONE-ACETAMINOPHEN 5-325 MG PO TABS: 2.0000 | ORAL_TABLET | ORAL | Status: DC | PRN

## 2015-05-27 MED ORDER — OXYTOCIN 40 UNITS IN LACTATED RINGERS INFUSION - SIMPLE MED: 62.5000 mL/h | INTRAVENOUS | Status: AC

## 2015-05-27 MED ORDER — LACTATED RINGERS IV SOLN: INTRAVENOUS | Status: AC

## 2015-05-27 MED ORDER — EPHEDRINE 5 MG/ML INJ
10.0000 mg | INTRAVENOUS | Status: DC | PRN
  Filled 2015-05-27: qty 2

## 2015-05-27 MED ORDER — CITRIC ACID-SODIUM CITRATE 334-500 MG/5ML PO SOLN: 30.0000 mL | ORAL | Status: DC | PRN

## 2015-05-27 MED ORDER — LANOLIN HYDROUS EX OINT: TOPICAL_OINTMENT | CUTANEOUS | Status: DC | PRN

## 2015-05-27 MED ORDER — OXYTOCIN BOLUS FROM INFUSION
500.0000 mL | INTRAVENOUS | Status: DC
  Administered 2015-05-27: 500 mL via INTRAVENOUS

## 2015-05-27 MED ORDER — PRENATAL MULTIVITAMIN CH
1.0000 | ORAL_TABLET | Freq: Every day | ORAL | Status: DC
  Administered 2015-05-28 – 2015-05-29 (×2): 1 via ORAL
  Filled 2015-05-27 (×2): qty 1

## 2015-05-27 MED ORDER — DIBUCAINE 1 % RE OINT: 1.0000 "application " | TOPICAL_OINTMENT | RECTAL | Status: DC | PRN

## 2015-05-27 MED ORDER — OXYTOCIN 40 UNITS IN LACTATED RINGERS INFUSION - SIMPLE MED
62.5000 mL/h | INTRAVENOUS | Status: DC
  Filled 2015-05-27: qty 1000

## 2015-05-27 MED ORDER — BENZOCAINE-MENTHOL 20-0.5 % EX AERO
1.0000 "application " | INHALATION_SPRAY | Freq: Four times a day (QID) | CUTANEOUS | Status: DC | PRN
  Administered 2015-05-27: 1 via TOPICAL
  Filled 2015-05-27: qty 56

## 2015-05-27 MED ORDER — ONDANSETRON HCL 4 MG/2ML IJ SOLN: 4.0000 mg | INTRAMUSCULAR | Status: DC | PRN

## 2015-05-27 MED ORDER — ZOLPIDEM TARTRATE 5 MG PO TABS: 5.0000 mg | ORAL_TABLET | Freq: Every evening | ORAL | Status: DC | PRN

## 2015-05-27 MED ORDER — IBUPROFEN 600 MG PO TABS
600.0000 mg | ORAL_TABLET | Freq: Four times a day (QID) | ORAL | Status: DC
  Administered 2015-05-27 – 2015-05-29 (×8): 600 mg via ORAL
  Filled 2015-05-27 (×9): qty 1

## 2015-05-27 MED ORDER — LACTATED RINGERS IV SOLN: 500.0000 mL | INTRAVENOUS | Status: DC | PRN

## 2015-05-27 MED ORDER — ONDANSETRON HCL 4 MG/2ML IJ SOLN: 4.0000 mg | Freq: Four times a day (QID) | INTRAMUSCULAR | Status: DC | PRN

## 2015-05-27 MED ORDER — LIDOCAINE HCL (PF) 1 % IJ SOLN
INTRAMUSCULAR | Status: DC | PRN
  Administered 2015-05-27: 4 mL
  Administered 2015-05-27: 5 mL

## 2015-05-27 MED ORDER — BENZOCAINE-MENTHOL 20-0.5 % EX AERO: 1.0000 "application " | INHALATION_SPRAY | CUTANEOUS | Status: DC | PRN

## 2015-05-27 MED ORDER — SIMETHICONE 80 MG PO CHEW
80.0000 mg | CHEWABLE_TABLET | ORAL | Status: DC | PRN
  Administered 2015-05-29: 80 mg via ORAL
  Filled 2015-05-27: qty 1

## 2015-05-27 MED ORDER — OXYTOCIN 40 UNITS IN LACTATED RINGERS INFUSION - SIMPLE MED
INTRAVENOUS | Status: AC
  Filled 2015-05-27: qty 1000

## 2015-05-27 MED ORDER — TETANUS-DIPHTH-ACELL PERTUSSIS 5-2.5-18.5 LF-MCG/0.5 IM SUSP: 0.5000 mL | Freq: Once | INTRAMUSCULAR | Status: DC

## 2015-05-27 MED ORDER — ACETAMINOPHEN 325 MG PO TABS
650.0000 mg | ORAL_TABLET | ORAL | Status: DC | PRN
Start: 2015-05-27 — End: 2015-05-27
  Administered 2015-05-27: 650 mg via ORAL
  Filled 2015-05-27: qty 2

## 2015-05-27 MED ORDER — DIPHENHYDRAMINE HCL 25 MG PO CAPS: 25.0000 mg | ORAL_CAPSULE | Freq: Four times a day (QID) | ORAL | Status: DC | PRN

## 2015-05-27 NOTE — Progress Notes (Signed)
Message left regarding pt in active labor and requesting call back

## 2015-05-27 NOTE — H&P (Signed)
NAMEDASANI, Christine Beard NO.:  [id number]  MEDICAL RECORD NO.:  [id number]  LOCATION:  [id number]                          FACILITY:  [facility]  PHYSICIAN:  Malachi Pro. Ambrose Mantle, M.D. DATE OF BIRTH:  [birth year]  DATE OF ADMISSION:  05/27/2015 DATE OF DISCHARGE:                             HISTORY & PHYSICAL   PRESENT ILLNESS:  This is a 24 year old white female, para 0, gravida 1, EDC May 31, 2015, admitted in active labor.  Blood group and type O negative with a positive antibody screen.  She had already had RhoGAM on repeat test at March 07, 2015, was negative, RPR nonreactive.  Urine culture negative.  Hepatitis B surface antigen negative.  GC and Chlamydia negative.  Rubella and varicella, not immune.  Pap smear test was normal.  TSH was normal.  One-hour Glucola was 55, group B strep negative.  Repeat HIV and RPR negative.  GC and Chlamydia negative. This patient's EDC was confirmed is May 31, 2015, by an 8-week 4-day ultrasound on October 23, 2014.  She began her prenatal course, first visit was at 14 weeks and 1 day.  She had a history of hypothyroidism, off medications for 2 years.  History of depression, off medications and no counseling for 2 years.  At 32 weeks, she was still coping well off thyroid and depression meds.  On May 26, 2015, she was contracting and seen in the office, 3 cm and 70%.  She later went to the Maternity Admission Unit and was unchanged.  So she was discharged.  She came back this morning contracting.  Her cervix was thought to be 7 cm.  She was admitted and now she is at 9+ cm dilated.  PAST MEDICAL HISTORY: 1. Hypothyroidism diagnosed in middle school, no medications for 2     years. 2. Anxiety, started in middle school.  Counseling helped some. 3. Depression, started in elementary school.  Medications and     counseling off for 2 years.  SURGICAL HISTORY:  She had a T and A in 2006 and left foot surgery in 2006.  MEDICATIONS  ALLERGIES:  CODEINE caused vomiting.  No latex or food allergies.  SOCIAL HISTORY:  The patient is a former smoker.  She quit in January of 2016, smoked 1/2 to 1 pack a day.  Used tobacco for 6 years.  No alcohol.  Denied drugs.  Eight grade education.  Unemployed.  FAMILY HISTORY:  Paternal grandmother with uterine cancer, diabetes type 1.  Paternal grandfather, myocardial infarction and heart surgery. Maternal grandmother, diabetes mellitus type 1, hypercholesterolemia. Sister, thyroid gland problems.  Mother, unknown problems; and a brother has petit mal seizure.  This is her first pregnancy,  PHYSICAL EXAMINATION:  VITAL SIGNS:  On admission temperature 98.1, pulse 105, respirations 22, blood pressure 106/84. HEART:  Normal size and sounds.  No murmurs. LUNGS:  Clear to auscultation.  Her last prenatal visit on May 26, 2015, the fundal height was 39 cm. Fetal heart tones are normal.  The patient is contracting every 2 to 4 minutes.  Cervix is 9+ cm.  The vertex is at +1 station.  Membranes are ruptured.  ADMITTING IMPRESSION: 1. Intrauterine pregnancy at 29  weeks and 3 days, active labor. 2. History of hypothyroidism and depression. 3. She is admitted and she requests an epidural.  We will ask the     anesthesiologist if he or she feels like she has time to give the     epidural.     Malachi Pro. Ambrose Mantle, M.D.     TFH/MEDQ  D:  05/27/2015  T:  05/27/2015  Job:  [id number]

## 2015-05-27 NOTE — Progress Notes (Signed)
Called back to get report on pt. Will put admit orders. GBS negative per MD

## 2015-05-27 NOTE — Anesthesia Procedure Notes (Signed)
Epidural Patient location during procedure: OB  Staffing Anesthesiologist: Shiya Fogelman Performed by: anesthesiologist   Preanesthetic Checklist Completed: patient identified, site marked, surgical consent, pre-op evaluation, timeout performed, IV checked, risks and benefits discussed and monitors and equipment checked  Epidural Patient position: sitting Prep: site prepped and draped and DuraPrep Patient monitoring: continuous pulse ox and blood pressure Approach: midline Location: L3-L4 Injection technique: LOR saline  Needle:  Needle type: Tuohy  Needle gauge: 17 G Needle length: 9 cm and 9 Needle insertion depth: 6 cm Catheter type: closed end flexible Catheter size: 19 Gauge Catheter at skin depth: 10 cm Test dose: negative  Assessment Events: blood not aspirated, injection not painful, no injection resistance, negative IV test and no paresthesia  Additional Notes Patient identified. Risks/Benefits/Options discussed with patient including but not limited to bleeding, infection, nerve damage, paralysis, failed block, incomplete pain control, headache, blood pressure changes, nausea, vomiting, reactions to medication both or allergic, itching and postpartum back pain. Confirmed with bedside nurse the patient's most recent platelet count. Confirmed with patient that they are not currently taking any anticoagulation, have any bleeding history or any family history of bleeding disorders. Patient expressed understanding and wished to proceed. All questions were answered. Sterile technique was used throughout the entire procedure. Please see nursing notes for vital signs. Test dose was given through epidural catheter and negative prior to continuing to dose epidural or start infusion. Warning signs of high block given to the patient including shortness of breath, tingling/numbness in hands, complete motor block, or any concerning symptoms with instructions to call for help. Patient was  given instructions on fall risk and not to get out of bed. All questions and concerns addressed with instructions to call with any issues or inadequate analgesia.   Reason for block:procedure for pain   

## 2015-05-27 NOTE — MAU Note (Signed)
Report called to [facility] on BS. Will go to 175

## 2015-05-27 NOTE — Progress Notes (Signed)
RN IN ROOM AND PT STATING PAIN SCALE 8 IN LOWER ABDOMEN AND ALSO WHERE STITCHES  ARE.PT REQUESTING PAIN MED. PT STATES " I CAN NOT STAND PAIN". PERCOCET AND MOTRIN GIVEN  FOR PAIN SCALE 8. PT SUPPLEMENTING INFANT WITH FORMULA SIM 19. AT PRESENT . DBEBP IN ROOM NAD PT WANTING TO WAIT ON USING. PLAN TO INTIATE LACTATION TO SEE PT DUE TO INFANT ? SHORT FRINDALEM AND PT HAVING SHORT SHAFT  NIPPLES

## 2015-05-27 NOTE — Progress Notes (Addendum)
Patient ID: Christine Beard, female   DOB: 12/12/1990, 24 y.o.   MRN: [id number] Delivery note:  The pt reached full dilatation and with very few pushes delivered a living female infant spontaneously ROA . There was meconium stained fluid. Apgars were 9 and 9 at 1 and 5 minutes. The placenta was intact and the uterus was normal. There were bilateral labial lacerations that were bleeding and a superficial laceration on the perineum at 8 0'clock that was hemostatic and not repaired. The labial lacerations were sutured with 3-0 vicryl under local block. EBL 165 cc's.

## 2015-05-27 NOTE — Progress Notes (Signed)
UR chart review completed.  

## 2015-05-27 NOTE — Progress Notes (Signed)
Patient ID: Christine Beard, female   DOB: Jun 08, 1991, 24 y.o.   MRN: [id number] Pt received her epidural and is more comfortable. The cervix is 9+ cm 100% effaced and the vertex is at + 1 station. There appears to be meconium stained fluid.

## 2015-05-27 NOTE — Progress Notes (Signed)
[name] was referred for history of depression/anxiety.  Referral is screened out by Clinical Social Worker because none of the following criteria appear to apply: -History of anxiety/depression during this pregnancy, or of post-partum depression. - Diagnosis of anxiety and/or depression within last 3 years or -[name]'s symptoms are currently being treated with medication and/or therapy.  Per chart review, [name] diagnosed with depression and anxiety since adolescence and received treatment for symptoms. Per chart, no need for treatment in 2 years.   Please contact the Clinical Social Worker if needs arise or upon [name] request.  

## 2015-05-27 NOTE — Anesthesia Postprocedure Evaluation (Signed)
  Anesthesia Post-op Note  Patient: Christine Beard  Procedure(s) Performed: * No procedures listed *  Patient Location: Mother/Baby  Anesthesia Type:Epidural  Level of Consciousness: awake, alert  and oriented  Airway and Oxygen Therapy: Patient Spontanous Breathing  Post-op Pain: none  Post-op Assessment: Post-op Vital signs reviewed and Patient's Cardiovascular Status Stable              Post-op Vital Signs: Reviewed and stable  Last Vitals:  Filed Vitals:   05/27/15 1240  BP: 115/56  Pulse: 82  Temp: 36.9 C  Resp: 20    Complications: No apparent anesthesia complications

## 2015-05-27 NOTE — Anesthesia Preprocedure Evaluation (Signed)
Anesthesia Evaluation  Patient identified by MRN, date of birth, ID band Patient awake    Reviewed: Allergy & Precautions, NPO status , Patient's Chart, lab work & pertinent test results  History of Anesthesia Complications Negative for: history of anesthetic complications  Airway Mallampati: II  TM Distance: >3 FB Neck ROM: Full    Dental no notable dental hx.    Pulmonary former smoker,  breath sounds clear to auscultation  Pulmonary exam normal       Cardiovascular negative cardio ROS Normal cardiovascular examRhythm:Regular Rate:Normal     Neuro/Psych negative neurological ROS  negative psych ROS   GI/Hepatic negative GI ROS, Neg liver ROS,   Endo/Other  Hypothyroidism   Renal/GU negative Renal ROS  negative genitourinary   Musculoskeletal negative musculoskeletal ROS (+)   Abdominal   Peds negative pediatric ROS (+)  Hematology negative hematology ROS (+)   Anesthesia Other Findings   Reproductive/Obstetrics (+) Pregnancy                             Anesthesia Physical Anesthesia Plan  ASA: II  Anesthesia Plan: Epidural   Post-op Pain Management:    Induction:   Airway Management Planned:   Additional Equipment:   Intra-op Plan:   Post-operative Plan:   Informed Consent: I have reviewed the patients History and Physical, chart, labs and discussed the procedure including the risks, benefits and alternatives for the proposed anesthesia with the patient or authorized representative who has indicated his/her understanding and acceptance.     Plan Discussed with:   Anesthesia Plan Comments:         Anesthesia Quick Evaluation

## 2015-05-28 LAB — CBC
HCT: 30.4 % — ABNORMAL LOW (ref 36.0–46.0)
HEMOGLOBIN: 10.1 g/dL — AB (ref 12.0–15.0)
MCH: 29.8 pg (ref 26.0–34.0)
MCHC: 33.2 g/dL (ref 30.0–36.0)
MCV: 89.7 fL (ref 78.0–100.0)
PLATELETS: 103 10*3/uL — AB (ref 150–400)
RBC: 3.39 MIL/uL — ABNORMAL LOW (ref 3.87–5.11)
RDW: 15.2 % (ref 11.5–15.5)
WBC: 14.5 10*3/uL — ABNORMAL HIGH (ref 4.0–10.5)

## 2015-05-28 MED ORDER — OXYCODONE-ACETAMINOPHEN 5-325 MG PO TABS
2.0000 | ORAL_TABLET | ORAL | Status: DC | PRN
  Administered 2015-05-29: 2 via ORAL
  Filled 2015-05-28: qty 2

## 2015-05-28 MED ORDER — OXYCODONE-ACETAMINOPHEN 5-325 MG PO TABS
1.0000 | ORAL_TABLET | ORAL | Status: DC | PRN
Start: 2015-05-28 — End: 2015-05-29
  Administered 2015-05-28 – 2015-05-29 (×4): 1 via ORAL
  Filled 2015-05-28 (×4): qty 1

## 2015-05-28 NOTE — Progress Notes (Signed)
Patient ID: Christine Beard, female   DOB: August 09, 1991, 24 y.o.   MRN: [id number] #1 afebrile BP normal no complaints

## 2015-05-28 NOTE — Lactation Note (Signed)
This note was copied from the chart of Christine Beard. Lactation Consultation Note New mom states baby get frustrated at breast. Mom has supplemented w/bottle. Baby isn't wanting breast at this time. Mom has tiny short shaft nipples. Hand expression taught w/easy flow of colostrum. Mom has DEBP in room for breast stimulation. SGA baby needs to get supplemention of colostrum or formula after BF. Fitted mom w/#16 NS, it is to large. I feel like baby could latch, but will not. Mom does have cone shaped breast and areolas, but Lt nipple compresses inwards slightly making for difficulty latching. Asked mom to call for [facility] assistance for next Feeding and please don't bottle feed so I can assist at the breast to obtain a plan. Mom agreed to call. Discussed positioning options. Mom stated she would rest. Patient Name: Christine Beard [id number] Date: 05/28/2015 Reason for consult: Initial assessment   Maternal Data Has patient been taught Hand Expression?: Yes Does the patient have breastfeeding experience prior to this delivery?: No  Feeding Feeding Type: Formula Nipple Type: Slow - flow  LATCH Score/Interventions Latch: Too sleepy or reluctant, no latch achieved, no sucking elicited. Intervention(s): Skin to skin;Teach feeding cues;Waking techniques Intervention(s): Adjust position;Assist with latch;Breast massage;Breast compression  Audible Swallowing: None Intervention(s): Skin to skin;Hand expression  Type of Nipple: Everted at rest and after stimulation (tiny short shaft nipples)  Comfort (Breast/Nipple): Soft / non-tender     Hold (Positioning): Assistance needed to correctly position infant at breast and maintain latch. Intervention(s): Breastfeeding basics reviewed;Support Pillows;Position options;Skin to skin  LATCH Score: 5  Lactation Tools Discussed/Used Tools: Pump Breast pump type: Double-Electric Breast Pump Pump Review: Setup, frequency, and cleaning;Milk  Storage Initiated by:: RN Date initiated:: 05/27/15   Consult Status Consult Status: Follow-up Date: 05/28/15 Follow-up type: In-patient    Dazha Kempa, Diamond Nickel 05/28/2015, 3:17 AM

## 2015-05-29 ENCOUNTER — Ambulatory Visit: Payer: Self-pay

## 2015-05-29 DIAGNOSIS — O365990 Maternal care for other known or suspected poor fetal growth, unspecified trimester, not applicable or unspecified: Secondary | ICD-10-CM

## 2015-05-29 MED ORDER — IBUPROFEN 600 MG PO TABS: 600.0000 mg | ORAL_TABLET | Freq: Four times a day (QID) | ORAL | Status: DC | PRN

## 2015-05-29 MED ORDER — OXYCODONE-ACETAMINOPHEN 5-325 MG PO TABS: 1.0000 | ORAL_TABLET | Freq: Four times a day (QID) | ORAL | Status: DC | PRN

## 2015-05-29 NOTE — Discharge Summary (Signed)
NAMEVICTORINE, Christine Beard NO.:  [id number]  MEDICAL RECORD NO.:  [id number]  LOCATION:  9137                          FACILITY:  [facility]  PHYSICIAN:  Malachi Pro. Ambrose Mantle, M.D. DATE OF BIRTH:  1991-04-10  DATE OF ADMISSION:  05/27/2015 DATE OF DISCHARGE:  05/29/2015                              DISCHARGE SUMMARY   This is a 24 year old white female, para 0, gravida 1, EDC May 31, 2015, admitted in active labor.  She had a history of depression.  She has been off medications and no counseling for 2 years.  History of hypothyroidism, off medications for 2 years.  She came to the hospital today after having couple of visits the day before.  She was 7 cm on admission, admitted, and when I first examined her she was 9+ cm.  The patient reached full dilatation with very few pushes and delivered a living female infant, 5 pounds 4 ounces spontaneously, ROA.  There was meconium-stained fluid.  Apgars were 9 and 9 at 1 and 5 minutes. Placenta was intact.  Uterus normal.  Bilateral labial lacerations were bleeding and sutured.  Superficial laceration on the perineum at 8 o'clock was not sutured.  The labial lacerations were hemostatic after repair.  Blood loss was 165 mL.  Postpartum, the patient did well and was discharged on the second postpartum day.  She was seen by the Child psychotherapist.  She was told that if she had needs regarding depression to call.  On the second postpartum day, the patient is afebrile.  No complaints.  The baby can go.  She will remain a baby patient.  Her initial hemoglobin was 14.1, hematocrit 41.1, white count 25,000, platelet count 113,000; followup hemoglobin 12.7 and 10.1.  FINAL DIAGNOSES:  Intrauterine pregnancy at 39+ weeks, delivered right occiput anterior, and growth restriction.  OPERATION:  Spontaneous delivery ROA, repair of bilateral labial lacerations.  FINAL CONDITION:  Improved.  INSTRUCTIONS:  Our regular discharge instruction booklet  as well as the after visit summary.  Prescriptions for Percocet 5/325, 30 tablets, 1 every 6 hours as needed for pain and Motrin 600 mg 30 tablets, 1 every 6 hours as needed for pain.  She is to return to the office in 6 weeks for followup examination.     Malachi Pro. Ambrose Mantle, M.D.     TFH/MEDQ  D:  05/29/2015  T:  05/29/2015  Job:  [id number]

## 2015-05-29 NOTE — Lactation Note (Signed)
This note was copied from the chart of Christine Brandii Lakey. Lactation Consultation Note: return to mother's room to assist with latch. She states that she just gave infant 5 ml of colostrum and 10 ml of formula. Advised mother in treatment to prevent engorgement. Mother offered follow up with LC . She will call if needed.  Patient Name: Christine Beard Date: 05/29/2015 Reason for consult: Follow-up assessment   Maternal Data Has patient been taught Hand Expression?: Yes  Feeding    LATCH Score/Interventions                      Lactation Tools Discussed/Used     Consult Status Consult Status: Follow-up Date: 05/29/15 Follow-up type: In-patient    Stevan Born [facility] 05/29/2015, 2:05 PM

## 2015-05-29 NOTE — Progress Notes (Signed)
Patient ID: Christine Beard, female   DOB: 04/05/1991, 24 y.o.   MRN: [id number] #[id number] afebrile BP normal Will d/c but pt may need to be a baby patient

## 2015-05-29 NOTE — Discharge Instructions (Signed)
booklet °

## 2015-05-29 NOTE — Lactation Note (Signed)
This note was copied from the chart of Christine Amalee Olsen. Lactation Consultation Note: infant is 63 hours old and has not had a stool. Mother states that she has attempt to breastfeed but infant has a tight tongue tie. Advised mother in hand expression. She has sprays of milk when hand expressing. Mother has pump 2-5 ml . She is supplementing infant with EBM/Formula . Mother states that infant is spitting up a lot. Mother is not active with WIC. She was informed to apply for [facility]. She has numbers. She was given a hand pump and advised in the need to post pump every 2-3 hours for 15 mins on each breast. Mother advised to page for assistance when latching infant.   Patient Name: Christine Beard ZOXWR'[id number] Date: 05/29/2015 Reason for consult: Follow-up assessment   Maternal Data Has patient been taught Hand Expression?: Yes  Feeding    LATCH Score/Interventions                      Lactation Tools Discussed/Used     Consult Status Consult Status: Follow-up Date: 05/29/15 Follow-up type: In-patient    Stevan Born [facility] 05/29/2015, 2:01 PM

## 2015-05-30 ENCOUNTER — Ambulatory Visit: Payer: Self-pay

## 2015-05-30 NOTE — Lactation Note (Signed)
This note was copied from the chart of Christine Keiva Dina. Lactation Consultation Note  Patient Name: Christine Beard [id number] Date: 05/30/2015 Reason for consult: Follow-up assessment Baby 16 hours old. Baby fussing when this [place] entered room. Assisted mom to latch baby to breast in football position using both a #20 NS which mom had requested and the #16 which mom had been fitted with earlier. Baby so fussy that she would not attempt to suckle. Mom able to hand express and nipple shield half full of EBM, but baby still not wanting to latch. Mom asked if it is baby's tongue making it hard for the baby to latch. Discussed baby's tongue and enc mom to discuss with pediatrician. Enc mom to continue using hand pump to stimulate breasts as long as she is using NS. Also discussed how to use piston in pump kit to pump both breast simultaneously. Mom given formula to supplement baby at this feeding because mom doesn't have EBM and baby very hungry and fussy. Enc mom to pump after baby fed in order to start supplementing with her own EBM as needed. Discussed with both parents that baby must be fed when cueing and at least every 3 hours regardless of cues. Discussed nursing baby as soon as baby cueing so she won't be too upset to nurse. Enc supplementing until mom's milk to full volume and baby satisfied at breast and gaining weight. Mom aware of OP/BFSG and [place] phone line assistance after D/C.  Discussed mom's hypothyroidism. Mom states that she was taking medication 2 years ago, but is not currently. Enc mom to discuss with her HCP as this impacts her own health and her ability to produce milk for the baby. Mom already aware that she was supposed to continue on the medication. Enc mom to call for assistance as needed.   Maternal Data    Feeding Feeding Type: Breast Fed Length of feed: 0 min  LATCH Score/Interventions Latch: Too sleepy or reluctant, no latch achieved, no sucking elicited. (Baby too  fussy, would not suckle.) Intervention(s): Skin to skin;Teach feeding cues Intervention(s): Adjust position;Assist with latch;Breast compression  Audible Swallowing: None  Type of Nipple: Flat Intervention(s): Hand pump  Comfort (Breast/Nipple): Soft / non-tender     Hold (Positioning): Assistance needed to correctly position infant at breast and maintain latch. Intervention(s): Breastfeeding basics reviewed;Support Pillows;Position options;Skin to skin  LATCH Score: 4  Lactation Tools Discussed/Used Tools: Nipple [place] [employer];Bottle Nipple shield size: 16;20 Breast pump type: Manual   Consult Status Consult Status: PRN    Inocente Salles 05/30/2015, 10:04 AM

## 2015-05-31 ENCOUNTER — Inpatient Hospital Stay (HOSPITAL_COMMUNITY): Admission: RE | Admit: 2015-05-31 | Payer: Medicaid Other | Source: Ambulatory Visit

## 2015-06-04 ENCOUNTER — Encounter (HOSPITAL_COMMUNITY): Payer: Self-pay | Admitting: *Deleted

## 2015-06-04 ENCOUNTER — Inpatient Hospital Stay (EMERGENCY_DEPARTMENT_HOSPITAL)
Admission: AD | Admit: 2015-06-04 | Discharge: 2015-06-04 | Disposition: A | Discharge status: Home / Self Care | Payer: Medicaid Other | Source: Ambulatory Visit | Attending: Obstetrics and Gynecology | Admitting: Obstetrics and Gynecology

## 2015-06-04 DIAGNOSIS — I158 Other secondary hypertension: Secondary | ICD-10-CM | POA: Insufficient documentation

## 2015-06-04 DIAGNOSIS — O169 Unspecified maternal hypertension, unspecified trimester: Secondary | ICD-10-CM | POA: Diagnosis not present

## 2015-06-04 DIAGNOSIS — O9089 Other complications of the puerperium, not elsewhere classified: Secondary | ICD-10-CM

## 2015-06-04 DIAGNOSIS — Z87891 Personal history of nicotine dependence: Secondary | ICD-10-CM

## 2015-06-04 DIAGNOSIS — O165 Unspecified maternal hypertension, complicating the puerperium: Secondary | ICD-10-CM

## 2015-06-04 HISTORY — DX: Headache: R51

## 2015-06-04 HISTORY — DX: Headache, unspecified: R51.9

## 2015-06-04 LAB — COMPREHENSIVE METABOLIC PANEL
ALBUMIN: 3 g/dL — AB (ref 3.5–5.0)
ALK PHOS: 130 U/L — AB (ref 38–126)
ALT: 21 U/L (ref 14–54)
ANION GAP: 8 (ref 5–15)
AST: 16 U/L (ref 15–41)
BILIRUBIN TOTAL: 0.5 mg/dL (ref 0.3–1.2)
BUN: 14 mg/dL (ref 6–20)
CALCIUM: 9 mg/dL (ref 8.9–10.3)
CO2: 25 mmol/L (ref 22–32)
CREATININE: 0.69 mg/dL (ref 0.44–1.00)
Chloride: 109 mmol/L (ref 101–111)
GFR calc Af Amer: >60 mL/min (ref >60)
GFR calc non Af Amer: >60 mL/min (ref >60)
GLUCOSE: 82 mg/dL (ref 65–99)
Potassium: 4.4 mmol/L (ref 3.5–5.1)
Sodium: 142 mmol/L (ref 135–145)
TOTAL PROTEIN: 6.2 g/dL — AB (ref 6.5–8.1)

## 2015-06-04 LAB — URINALYSIS, ROUTINE W REFLEX MICROSCOPIC
Bilirubin Urine: NEGATIVE
GLUCOSE, UA: NEGATIVE mg/dL
Ketones, ur: NEGATIVE mg/dL
Nitrite: NEGATIVE
PH: 7.5 (ref 5.0–8.0)
PROTEIN: NEGATIVE mg/dL
Specific Gravity, Urine: 1.015 (ref 1.005–1.030)
Urobilinogen, UA: 0.2 mg/dL (ref 0.0–1.0)

## 2015-06-04 LAB — PROTEIN / CREATININE RATIO, URINE
Creatinine, Urine: 33 mg/dL
PROTEIN CREATININE RATIO: 0.52 mg/mg{creat} — AB (ref 0.00–0.15)
Total Protein, Urine: 17 mg/dL

## 2015-06-04 LAB — CBC
HEMATOCRIT: 36.7 % (ref 36.0–46.0)
HEMOGLOBIN: 12.1 g/dL (ref 12.0–15.0)
MCH: 29.7 pg (ref 26.0–34.0)
MCHC: 33 g/dL (ref 30.0–36.0)
MCV: 90.2 fL (ref 78.0–100.0)
Platelets: 190 10*3/uL (ref 150–400)
RBC: 4.07 MIL/uL (ref 3.87–5.11)
RDW: 14 % (ref 11.5–15.5)
WBC: 11.7 10*3/uL — ABNORMAL HIGH (ref 4.0–10.5)

## 2015-06-04 LAB — URINE MICROSCOPIC-ADD ON

## 2015-06-04 LAB — URIC ACID: Uric Acid, Serum: 6.5 mg/dL (ref 2.3–6.6)

## 2015-06-04 LAB — LACTATE DEHYDROGENASE: LDH: 230 U/L — ABNORMAL HIGH (ref 98–192)

## 2015-06-04 MED ORDER — ACETAMINOPHEN 500 MG PO TABS
1000.0000 mg | ORAL_TABLET | Freq: Once | ORAL | Status: AC
  Administered 2015-06-04: 1000 mg via ORAL
  Filled 2015-06-04: qty 2

## 2015-06-04 MED ORDER — LACTATED RINGERS IV SOLN
INTRAVENOUS | Status: DC
  Administered 2015-06-04: 16:00:00 via INTRAVENOUS

## 2015-06-04 MED ORDER — DIPHENHYDRAMINE HCL 50 MG/ML IJ SOLN
25.0000 mg | Freq: Once | INTRAMUSCULAR | Status: AC
  Administered 2015-06-04: 25 mg via INTRAVENOUS
  Filled 2015-06-04: qty 1

## 2015-06-04 MED ORDER — DEXAMETHASONE SODIUM PHOSPHATE 10 MG/ML IJ SOLN
10.0000 mg | Freq: Once | INTRAMUSCULAR | Status: AC
  Administered 2015-06-04: 10 mg via INTRAVENOUS
  Filled 2015-06-04: qty 1

## 2015-06-04 MED ORDER — LABETALOL HCL 5 MG/ML IV SOLN
20.0000 mg | INTRAVENOUS | Status: DC | PRN
  Administered 2015-06-04: 20 mg via INTRAVENOUS
  Filled 2015-06-04: qty 4

## 2015-06-04 MED ORDER — LABETALOL HCL 100 MG PO TABS
100.0000 mg | ORAL_TABLET | Freq: Two times a day (BID) | ORAL | Status: DC
Start: 2015-06-04 — End: 2017-02-20

## 2015-06-04 MED ORDER — METOCLOPRAMIDE HCL 5 MG/ML IJ SOLN
10.0000 mg | Freq: Once | INTRAMUSCULAR | Status: AC
  Administered 2015-06-04: 10 mg via INTRAVENOUS
  Filled 2015-06-04: qty 2

## 2015-06-04 MED ORDER — LABETALOL HCL 100 MG PO TABS
100.0000 mg | ORAL_TABLET | Freq: Once | ORAL | Status: AC
  Administered 2015-06-04: 100 mg via ORAL
  Filled 2015-06-04: qty 1

## 2015-06-04 NOTE — MAU Note (Signed)
C/o elevated BPs and swollen legs and feet; delivered vaginally 8 days ago

## 2015-06-04 NOTE — Discharge Instructions (Signed)

## 2015-06-04 NOTE — MAU Provider Note (Signed)
History     CSN: [id number]  Arrival date and time: 06/04/15 1352   First Provider Initiated Contact with Patient 06/04/15 1633         Chief Complaint  Patient presents with  . Leg Swelling  . Hypertension   HPI  MASAYO FERA is a 24 y.o. G1P1001 at 1 week s/p SVD who presents with headache & hypertension.  Checked BP at home & it was 170s/100s.  Has had headache since this morning. States has history of headaches & this feels like a normal headache to her. Rates as 9/10. Has not treated. No photophobia. Denies vision changes.  No epigastric pain, or n/v.  Yesterday noticed swelling in her feet & ankles, has since resolved.      Past Medical History  Diagnosis Date  . Thyroid disease   . Hypothyroidism   . Anxiety   . Depression   . Headache     Past Surgical History  Procedure Laterality Date  . Tonsillectomy    . Toe surgery      Family History  Problem Relation Age of Onset  . Diabetes Maternal Grandmother   . Diabetes Paternal Grandmother     Social History  Substance Use Topics  . Smoking status: Former Smoker    Quit date: 09/03/2014  . Smokeless tobacco: Never Used  . Alcohol Use: No    Allergies:  Allergies  Allergen Reactions  . Codeine Nausea And Vomiting    Prescriptions prior to admission  Medication Sig Dispense Refill Last Dose  . CALCIUM PO Take 1 tablet by mouth daily.   06/04/2015 at Unknown time  . Doxylamine-Pyridoxine (DICLEGIS) 10-10 MG TBEC Take 1 tablet by mouth 2 (two) times daily as needed (for nausea).   06/04/2015 at Unknown time  . ibuprofen (ADVIL,MOTRIN) 600 MG tablet Take 1 tablet (600 mg total) by mouth every 6 (six) hours as needed. 30 tablet 0 06/04/2015 at Unknown time  . IRON PO Take 1 tablet by mouth daily.   06/04/2015 at Unknown time  . oxyCODONE-acetaminophen (PERCOCET/ROXICET) 5-325 MG per tablet Take 1 tablet by mouth every 6 (six) hours as needed for moderate pain (pain scale 7 or less). 30 tablet 0  Past Week at Unknown time  . Prenatal Vit-Fe Fumarate-FA (PRENATAL MULTIVITAMIN) TABS tablet Take 1 tablet by mouth daily at 12 noon.   06/04/2015 at Unknown time    Review of Systems  Constitutional: Negative.   Eyes: Negative.   Respiratory: Negative.   Cardiovascular: Negative.   Gastrointestinal: Negative.   Genitourinary: Negative.   Neurological: Positive for headaches. Negative for dizziness and seizures.   Physical Exam   Blood pressure 168/86, pulse 61, temperature 98.2 F (36.8 C), resp. rate 18, last menstrual period 08/16/2014, unknown if currently breastfeeding.  Patient Vitals for the past 24 hrs:  BP Temp Pulse Resp  06/04/15 1829 123/70 mmHg - 68 -  06/04/15 1815 141/64 mmHg - 68 -  06/04/15 1800 141/64 mmHg - 68 -  06/04/15 1745 146/72 mmHg - 64 -  06/04/15 1730 144/71 mmHg - 61 -  06/04/15 1710 153/75 mmHg - 63 -  06/04/15 1701 140/70 mmHg - 61 -  06/04/15 1658 165/69 mmHg - (!) 57 -  06/04/15 1645 155/76 mmHg - (!) 57 -  06/04/15 1635 155/76 mmHg - (!) 57 -  06/04/15 1633 157/71 mmHg - (!) 58 18  06/04/15 1550 166/86 mmHg - 61 -  06/04/15 1541 167/82 mmHg - (!) 56 -  06/04/15 1531 172/84 mmHg - (!) 59 -  06/04/15 1511 168/86 mmHg - 61 -  06/04/15 1501 162/80 mmHg - 61 -  06/04/15 1450 168/84 mmHg - 61 -  06/04/15 1443 160/86 mmHg 98.2 F (36.8 C) 65 18    Physical Exam  Nursing note and vitals reviewed. Constitutional: She is oriented to person, place, and time. She appears well-developed and well-nourished. No distress.  HENT:  Head: Normocephalic and atraumatic.  Eyes: Conjunctivae are normal. Right eye exhibits no discharge. Left eye exhibits no discharge. No scleral icterus.  Neck: Normal range of motion.  Cardiovascular: Normal rate, regular rhythm and normal heart sounds.   No murmur heard. Respiratory: Effort normal and breath sounds normal. No respiratory distress. She has no wheezes.  GI: Soft. Bowel sounds are normal. There is no  tenderness.  Musculoskeletal: Normal range of motion. She exhibits no edema or tenderness.  Neurological: She is alert and oriented to person, place, and time.  Reflex Scores:      Patellar reflexes are 3+ on the right side and 3+ on the left side. No clonus  Skin: Skin is warm and dry. She is not diaphoretic.  Psychiatric: She has a normal mood and affect. Her behavior is normal. Judgment and thought content normal.    MAU Course  Procedures Results for orders placed or performed during the hospital encounter of 06/04/15 (from the past 24 hour(s))  Urinalysis, Routine w reflex microscopic (not at [facility])     Status: Abnormal   Collection Time: 06/04/15  1:52 PM  Result Value Ref Range   Color, Urine YELLOW YELLOW   APPearance CLEAR CLEAR   Specific Gravity, Urine 1.015 1.005 - 1.030   pH 7.5 5.0 - 8.0   Glucose, UA NEGATIVE NEGATIVE mg/dL   Hgb urine dipstick LARGE (A) NEGATIVE   Bilirubin Urine NEGATIVE NEGATIVE   Ketones, ur NEGATIVE NEGATIVE mg/dL   Protein, ur NEGATIVE NEGATIVE mg/dL   Urobilinogen, UA 0.2 0.0 - 1.0 mg/dL   Nitrite NEGATIVE NEGATIVE   Leukocytes, UA LARGE (A) NEGATIVE  Protein / creatinine ratio, urine     Status: Abnormal   Collection Time: 06/04/15  1:52 PM  Result Value Ref Range   Creatinine, Urine 33.00 mg/dL   Total Protein, Urine 17 mg/dL   Protein Creatinine Ratio 0.52 (H) 0.00 - 0.15 mg/mg[Cre]  Urine microscopic-add on     Status: Abnormal   Collection Time: 06/04/15  1:52 PM  Result Value Ref Range   Squamous Epithelial / LPF FEW (A) RARE   WBC, UA 7-10 <3 WBC/hpf   RBC / HPF 11-20 <3 RBC/hpf  CBC     Status: Abnormal   Collection Time: 06/04/15  2:57 PM  Result Value Ref Range   WBC 11.7 (H) 4.0 - 10.5 K/uL   RBC 4.07 3.87 - 5.11 MIL/uL   Hemoglobin 12.1 12.0 - 15.0 g/dL   HCT 16.1 09.6 - 04.5 %   MCV 90.2 78.0 - 100.0 fL   MCH 29.7 26.0 - 34.0 pg   MCHC 33.0 30.0 - 36.0 g/dL   RDW 40.9 81.1 - 91.4 %   Platelets 190 150 - 400 K/uL   Comprehensive metabolic panel     Status: Abnormal   Collection Time: 06/04/15  2:57 PM  Result Value Ref Range   Sodium 142 135 - 145 mmol/L   Potassium 4.4 3.5 - 5.1 mmol/L   Chloride 109 101 - 111 mmol/L   CO2 25 22 - 32 mmol/L  Glucose, Bld 82 65 - 99 mg/dL   BUN 14 6 - 20 mg/dL   Creatinine, Ser 1.61 0.44 - 1.00 mg/dL   Calcium 9.0 8.9 - 09.6 mg/dL   Total Protein 6.2 (L) 6.5 - 8.1 g/dL   Albumin 3.0 (L) 3.5 - 5.0 g/dL   AST 16 15 - 41 U/L   ALT 21 14 - 54 U/L   Alkaline Phosphatase 130 (H) 38 - 126 U/L   Total Bilirubin 0.5 0.3 - 1.2 mg/dL   GFR calc non Af Amer >60 >60 mL/min   GFR calc Af Amer >60 >60 mL/min   Anion gap 8 5 - 15  Lactate dehydrogenase     Status: Abnormal   Collection Time: 06/04/15  2:57 PM  Result Value Ref Range   LDH 230 (H) 98 - 192 U/L  Uric acid     Status: None   Collection Time: 06/04/15  2:57 PM  Result Value Ref Range   Uric Acid, Serum 6.5 2.3 - 6.6 mg/dL    MDM Labetalol per preeclampsia protocol 1621- Left voicemail with Dr. Senaida Ores 1640-C/w Dr. Senaida Ores - give po labetalol; d/c home on labetalol if h/a & BP ok.  Tylenol did not relieve headache; headache cocktail given, h/a improved, 3/10.  BPs in range after labetalol Assessment and Plan  A: 1. Postpartum hypertension    P: Discharge home in stable condition Rx for labetalol 100 mg BID Call [facility] in morning to schedule BP check on Friday Discussed reasons to return to MAU   Judeth Horn, NP  06/04/2015, 3:26 PM

## 2015-06-05 ENCOUNTER — Inpatient Hospital Stay (HOSPITAL_COMMUNITY)
Admission: AD | Admit: 2015-06-05 | Discharge: 2015-06-07 | DRG: 776 | Disposition: A | Discharge status: Home / Self Care | Payer: Medicaid Other | Source: Ambulatory Visit | Attending: Obstetrics and Gynecology | Admitting: Obstetrics and Gynecology

## 2015-06-05 DIAGNOSIS — O9089 Other complications of the puerperium, not elsewhere classified: Secondary | ICD-10-CM | POA: Diagnosis present

## 2015-06-05 DIAGNOSIS — I158 Other secondary hypertension: Secondary | ICD-10-CM | POA: Diagnosis present

## 2015-06-05 DIAGNOSIS — R51 Headache: Secondary | ICD-10-CM | POA: Diagnosis not present

## 2015-06-05 DIAGNOSIS — O1495 Unspecified pre-eclampsia, complicating the puerperium: Secondary | ICD-10-CM | POA: Diagnosis present

## 2015-06-05 DIAGNOSIS — O169 Unspecified maternal hypertension, unspecified trimester: Secondary | ICD-10-CM | POA: Diagnosis not present

## 2015-06-05 LAB — CBC
HEMATOCRIT: 34.5 % — AB (ref 36.0–46.0)
HEMOGLOBIN: 11.1 g/dL — AB (ref 12.0–15.0)
MCH: 29.1 pg (ref 26.0–34.0)
MCHC: 32.2 g/dL (ref 30.0–36.0)
MCV: 90.6 fL (ref 78.0–100.0)
PLATELETS: 207 10*3/uL (ref 150–400)
RBC: 3.81 MIL/uL — AB (ref 3.87–5.11)
RDW: 14.2 % (ref 11.5–15.5)
WBC: 16.9 10*3/uL — AB (ref 4.0–10.5)

## 2015-06-05 LAB — COMPREHENSIVE METABOLIC PANEL
ALT: 19 U/L (ref 14–54)
AST: 16 U/L (ref 15–41)
Albumin: 3.1 g/dL — ABNORMAL LOW (ref 3.5–5.0)
Alkaline Phosphatase: 111 U/L (ref 38–126)
Anion gap: 11 (ref 5–15)
BILIRUBIN TOTAL: 0.7 mg/dL (ref 0.3–1.2)
BUN: 12 mg/dL (ref 6–20)
CHLORIDE: 105 mmol/L (ref 101–111)
CO2: 24 mmol/L (ref 22–32)
CREATININE: 0.74 mg/dL (ref 0.44–1.00)
Calcium: 8.7 mg/dL — ABNORMAL LOW (ref 8.9–10.3)
GFR calc Af Amer: >60 mL/min (ref >60)
Glucose, Bld: 95 mg/dL (ref 65–99)
Potassium: 3.8 mmol/L (ref 3.5–5.1)
Sodium: 140 mmol/L (ref 135–145)
Total Protein: 6.3 g/dL — ABNORMAL LOW (ref 6.5–8.1)

## 2015-06-05 LAB — TSH: TSH: 1.167 u[IU]/mL (ref 0.350–4.500)

## 2015-06-05 LAB — MRSA PCR SCREENING: MRSA BY PCR: NEGATIVE

## 2015-06-05 MED ORDER — OXYCODONE-ACETAMINOPHEN 5-325 MG PO TABS
1.0000 | ORAL_TABLET | Freq: Four times a day (QID) | ORAL | Status: DC | PRN
  Administered 2015-06-05: 2 via ORAL
  Administered 2015-06-06: 1 via ORAL
  Administered 2015-06-06: 2 via ORAL
  Administered 2015-06-07: 1 via ORAL
  Filled 2015-06-05: qty 1
  Filled 2015-06-05 (×2): qty 2
  Filled 2015-06-05: qty 1

## 2015-06-05 MED ORDER — INFLUENZA VAC SPLIT QUAD 0.5 ML IM SUSY
0.5000 mL | PREFILLED_SYRINGE | INTRAMUSCULAR | Status: DC
  Filled 2015-06-05: qty 0.5

## 2015-06-05 MED ORDER — LACTATED RINGERS IV SOLN
INTRAVENOUS | Status: DC
  Administered 2015-06-05 – 2015-06-06 (×3): via INTRAVENOUS

## 2015-06-05 MED ORDER — IBUPROFEN 600 MG PO TABS
600.0000 mg | ORAL_TABLET | Freq: Four times a day (QID) | ORAL | Status: DC | PRN
  Administered 2015-06-05 – 2015-06-07 (×5): 600 mg via ORAL
  Filled 2015-06-05 (×5): qty 1

## 2015-06-05 MED ORDER — MAGNESIUM SULFATE 50 % IJ SOLN
1.0000 g/h | INTRAVENOUS | Status: DC
  Administered 2015-06-05: 2 g/h via INTRAVENOUS
  Filled 2015-06-05 (×3): qty 80

## 2015-06-05 MED ORDER — LABETALOL HCL 100 MG PO TABS
100.0000 mg | ORAL_TABLET | Freq: Two times a day (BID) | ORAL | Status: DC
  Administered 2015-06-05 – 2015-06-07 (×3): 100 mg via ORAL
  Filled 2015-06-05 (×4): qty 1

## 2015-06-05 MED ORDER — MAGNESIUM SULFATE BOLUS VIA INFUSION
4.0000 g | Freq: Once | INTRAVENOUS | Status: AC
  Administered 2015-06-05: 4 g via INTRAVENOUS
  Filled 2015-06-05: qty 500

## 2015-06-06 ENCOUNTER — Encounter (HOSPITAL_COMMUNITY): Payer: Self-pay | Admitting: *Deleted

## 2015-06-06 LAB — CBC WITH DIFFERENTIAL/PLATELET
BASOS ABS: 0 10*3/uL (ref 0.0–0.1)
BASOS PCT: 0 % (ref 0–1)
Eosinophils Absolute: 0.1 10*3/uL (ref 0.0–0.7)
Eosinophils Relative: 1 % (ref 0–5)
HEMATOCRIT: 34.9 % — AB (ref 36.0–46.0)
HEMOGLOBIN: 11.2 g/dL — AB (ref 12.0–15.0)
LYMPHS PCT: 13 % (ref 12–46)
Lymphs Abs: 1.3 10*3/uL (ref 0.7–4.0)
MCH: 29.3 pg (ref 26.0–34.0)
MCHC: 32.1 g/dL (ref 30.0–36.0)
MCV: 91.4 fL (ref 78.0–100.0)
Monocytes Absolute: 0.7 10*3/uL (ref 0.1–1.0)
Monocytes Relative: 6 % (ref 3–12)
NEUTROS ABS: 8.1 10*3/uL — AB (ref 1.7–7.7)
NEUTROS PCT: 80 % — AB (ref 43–77)
Platelets: 167 10*3/uL (ref 150–400)
RBC: 3.82 MIL/uL — ABNORMAL LOW (ref 3.87–5.11)
RDW: 14.5 % (ref 11.5–15.5)
WBC: 10.2 10*3/uL (ref 4.0–10.5)

## 2015-06-06 LAB — COMPREHENSIVE METABOLIC PANEL
ALBUMIN: 2.9 g/dL — AB (ref 3.5–5.0)
ALK PHOS: 113 U/L (ref 38–126)
ALT: 20 U/L (ref 14–54)
AST: 19 U/L (ref 15–41)
Anion gap: 8 (ref 5–15)
BILIRUBIN TOTAL: 0.3 mg/dL (ref 0.3–1.2)
BUN: 10 mg/dL (ref 6–20)
CALCIUM: 5.8 mg/dL — AB (ref 8.9–10.3)
CO2: 26 mmol/L (ref 22–32)
CREATININE: 0.57 mg/dL (ref 0.44–1.00)
Chloride: 102 mmol/L (ref 101–111)
GFR calc Af Amer: >60 mL/min (ref >60)
GFR calc non Af Amer: >60 mL/min (ref >60)
GLUCOSE: 107 mg/dL — AB (ref 65–99)
Potassium: 3.7 mmol/L (ref 3.5–5.1)
SODIUM: 136 mmol/L (ref 135–145)
Total Protein: 5.7 g/dL — ABNORMAL LOW (ref 6.5–8.1)

## 2015-06-06 LAB — CREATININE CLEARANCE, URINE, 24 HOUR
CREAT CLEAR: 75 mL/min (ref 75–115)
CREATININE 24H UR: 618 mg/d (ref 600–1800)
Collection Interval-CRCL: 24 hours
Creatinine, Urine: 22.89 mg/dL
URINE TOTAL VOLUME-CRCL: 2700 mL

## 2015-06-06 LAB — MAGNESIUM
Magnesium: 7.1 mg/dL (ref 1.7–2.4)
Magnesium: 7.4 mg/dL (ref 1.7–2.4)
Magnesium: 6.3 mg/dL (ref 1.7–2.4)

## 2015-06-06 MED ORDER — ONDANSETRON HCL 4 MG/2ML IJ SOLN
4.0000 mg | INTRAMUSCULAR | Status: DC | PRN
  Administered 2015-06-06 (×2): 4 mg via INTRAVENOUS
  Filled 2015-06-06 (×2): qty 2

## 2015-06-06 MED ORDER — SODIUM CHLORIDE 0.9 % IJ SOLN
3.0000 mL | INTRAMUSCULAR | Status: DC | PRN
  Administered 2015-06-06: 3 mL via INTRAVENOUS
  Filled 2015-06-06: qty 3

## 2015-06-06 MED ORDER — INFLUENZA VAC SPLIT QUAD 0.5 ML IM SUSY
0.5000 mL | PREFILLED_SYRINGE | INTRAMUSCULAR | Status: AC
  Administered 2015-06-07: 0.5 mL via INTRAMUSCULAR
  Filled 2015-06-06: qty 0.5

## 2015-06-06 NOTE — Progress Notes (Signed)
Results for Christine Beard, Christine Beard (MRN [id number]) as of 06/06/2015 11:48  Ref. Range 06/06/2015 09:21  Sodium Latest Ref Range: 135-145 mmol/L 136  Potassium Latest Ref Range: 3.5-5.1 mmol/L 3.7  Chloride Latest Ref Range: 101-111 mmol/L 102  CO2 Latest Ref Range: 22-32 mmol/L 26  BUN Latest Ref Range: 6-20 mg/dL 10  Creatinine Latest Ref Range: 0.44-1.00 mg/dL 0.57  Calcium Latest Ref Range: 8.9-10.3 mg/dL 5.8 (LL)  EGFR (Non-African Amer.) Latest Ref Range: >60 mL/min >60  EGFR (African American) Latest Ref Range: >60 mL/min >60  Glucose Latest Ref Range: 65-99 mg/dL 107 (H)  Anion gap Latest Ref Range: 5-15  8  Magnesium Latest Ref Range: 1.7-2.4 mg/dL 7.1 (HH)  Alkaline Phosphatase Latest Ref Range: 38-126 U/L 113  Albumin Latest Ref Range: 3.5-5.0 g/dL 2.9 (L)  AST Latest Ref Range: 15-41 U/L 19  ALT Latest Ref Range: 14-54 U/L 20  Total Protein Latest Ref Range: 6.5-8.1 g/dL 5.7 (L)  Total Bilirubin Latest Ref Range: 0.3-1.2 mg/dL 0.3  WBC Latest Ref Range: 4.0-10.5 K/uL 10.2  RBC Latest Ref Range: 3.87-5.11 MIL/uL 3.82 (L)  Hemoglobin Latest Ref Range: 12.0-15.0 g/dL 11.2 (L)  HCT Latest Ref Range: 36.0-46.0 % 34.9 (L)  MCV Latest Ref Range: 78.0-100.0 fL 91.4  MCH Latest Ref Range: 26.0-34.0 pg 29.3  MCHC Latest Ref Range: 30.0-36.0 g/dL 32.1  RDW Latest Ref Range: 11.5-15.5 % 14.5  Platelets Latest Ref Range: 150-400 K/uL 167  Neutrophils Latest Ref Range: 43-77 % 80 (H)  Lymphocytes Latest Ref Range: 12-46 % 13  Monocytes Relative Latest Ref Range: 3-12 % 6  Eosinophil Latest Ref Range: 0-5 % 1  Basophil Latest Ref Range: 0-1 % 0  NEUT# Latest Ref Range: 1.7-7.7 K/uL 8.1 (H)  Lymphocyte # Latest Ref Range: 0.7-4.0 K/uL 1.3  Monocyte # Latest Ref Range: 0.1-1.0 K/uL 0.7  Eosinophils Absolute Latest Ref Range: 0.0-0.7 K/uL 0.1  Basophils Absolute Latest Ref Range: 0.0-0.1 K/uL 0.0   Phoned Lab values to Dr. Ulanda Edison including Magnesium 7.1 and Calcium 5.8.  Also reported that  patient is lethargic and requires assistance with ambulation d/t unsteady gait.  See order to decrease magnesium to 1.5gm/hr and recheck serum level at 4pm.

## 2015-06-06 NOTE — Progress Notes (Signed)
CRITICAL VALUE ALERT  Critical value received:  Magnesium 7.1  Date of notification:  06/06/2015  Time of notification:  1139  Critical value read back:Yes.    Nurse who received alert:  Fara Chute, RN  MD notified (1st page):  Dr. Ambrose Mantle  Time of first page:  1142  MD notified (2nd page):  Time of second page:  Responding MD:    Time MD responded:

## 2015-06-06 NOTE — Progress Notes (Signed)
Patient ID: Christine Beard, female   DOB: December 08, 1990, 24 y.o.   MRN: [id number] Pt's BP has improved. She still rates her headache as 6-7/10 She is unable to relate the HA to position. This AM she has had vomiting. Will check labs and magnesium level

## 2015-06-06 NOTE — H&P (Signed)
NAMEHAILEI, Beard NO.:  [id number]  MEDICAL RECORD NO.:  [id number]  LOCATION:  9373                          FACILITY:  [facility]  PHYSICIAN:  Malachi Pro. Ambrose Mantle, M.D. DATE OF BIRTH:  04/02/1991  DATE OF ADMISSION:  06/05/2015 DATE OF DISCHARGE:                             HISTORY & PHYSICAL   HISTORY OF PRESENT ILLNESS:  This is a 24 year old white female, para 1- 0-0-1, who is admitted for postpartum hypertension, headache, and preeclampsia.  This patient delivered a living infant vaginally on May 27, 2015.  She had no a high blood pressure and no significant problems except the baby was growth restricted, weighing 5 pounds 5 ounces at term.  She apparently did fairly well for the first few days after delivery, but then states she began developing a headache.  She noticed swelling in her lower extremities and was advised to take her blood pressure, which she took on June 04, 2015 and her blood pressure was significantly elevated at somewhere around 170/100.  She went to the maternity admission unit where she was noted to have significantly elevated blood pressure in the 180/100 range and she was given labetalol 100 mg by mouth.  Her labs were normal except for a slightly elevated protein-creatinine ratio.  The patient was discharged on labetalol 100 mg twice a day and advised to come to the office on June 06, 2015. Her headache became a little bit worse today after decreasing to a 3/10 in the emergency room.  It worsened today and she came to the hospital for further followup.  In the office today, I did a cath urine, which was negative for protein.  Her blood labs at the hospital last night had been completely normal.  The platelet count was a 192,000 and in the hospital during delivery, it had been just above 100,000 on 2 occasions. Her blood pressure in our office today was 160/90.  As I stated that she complained the headache had gone up to an 8/10.   I called Dr. Sherrie George, maternal fetal medicine specialist, and she advised admitting the patient for treatment of preeclampsia.  She felt there was no doubt that the patient had preeclampsia in spite of the negative protein and the normal blood labs.  The only abnormal lab was an LDH of 230.  The uric acid was 6.5, her SGOT and PT were completely normal.  PAST MEDICAL HISTORY:  Reveals she has had hypothyroidism, she has had depression off and on.  PAST SURGICAL HISTORY:  She had left foot surgery in 2006, T and A in 2006.  ALLERGIES:  Apparently codeine causes vomiting.  She has no latex or food allergies.  FAMILY HISTORY:  Mother maybe some type of illness of unknown cause, brother with seizures.  Sister with a thyroid condition.  Paternal grandmother with uterine cancer.  Paternal grandfather, heart attacks started in his 6s, maternal grandmother with type 1 diabetes and hypercholesterolemia.  SOCIAL HISTORY:  The patient is a smoker, does not drink and does not take drugs.  She is unemployed.  PHYSICAL EXAMINATION:  VITAL SIGNS:  On today's visit, blood pressure is 160/90, pulse is 80. HEART:  Normal size and sounds.  No murmurs. LUNGS:  Clear to auscultation. ABDOMEN:  Soft, nontender.  No masses. PELVIC:  Not done, except for the vulva which I did catheterize urine.  ADMITTING IMPRESSION:  Postpartum preeclampsia.  The patient is admitted for intravenous magnesium sulfate and take labetalol 100 mg by mouth twice a day.  We will repeat her blood labs and collect the 24-hour urine for protein and creatinine clearance.     Malachi Pro. Ambrose Mantle, M.D.     TFH/MEDQ  D:  06/05/2015  T:  06/05/2015  Job:  [id number]

## 2015-06-06 NOTE — Progress Notes (Signed)
/  CRITICAL VALUE ALERT / Critical value received:  Magnesium 7.4   Date of notification:  06/06/2015  Time of notification:  1645  Critical value read back:Yes.    Nurse who received alert:  Fara Chute, RN  MD notified (1st page):  Dr. Ambrose Mantle   Time of first page:  1651  MD notified (2nd page):  Time of second page:  Responding MD:  Dr. Ambrose Mantle  Time MD responded:  1652  Gave order to decrease Magnesium to 1 g/hr and recheck Magnesium level at 2000 tonight.  Notify Dr. Jackelyn Knife of results since he will be on-call tonight.

## 2015-06-07 DIAGNOSIS — O1495 Unspecified pre-eclampsia, complicating the puerperium: Secondary | ICD-10-CM | POA: Diagnosis present

## 2015-06-07 MED ORDER — ONDANSETRON 8 MG PO TBDP
8.0000 mg | ORAL_TABLET | Freq: Once | ORAL | Status: AC
  Administered 2015-06-07: 8 mg via ORAL
  Filled 2015-06-07: qty 1

## 2015-06-07 NOTE — Progress Notes (Signed)
Pt ambulated to main entrance for discharge home.

## 2015-06-07 NOTE — Discharge Instructions (Signed)
Call for worsening headache or any other unusual symptomss

## 2015-06-07 NOTE — Progress Notes (Signed)
HD#3, postpartum headache/preeclampsia Magnesium turned off last pm due to excellent diuresis, normal BP and headache resolved Feeling better today, occasional mild headache or neck pain Afeb, VSS, BP is normal Fundus firm Will d/c home on low dose Labetalol

## 2015-06-07 NOTE — Progress Notes (Signed)
Pt vomiting. She admits to nausea & vomiting being an intermittent problem since the age of 6 years "it just comes on with or without food". Took Diclegis during pregnancy & Imotrol when not pregnant. Dr Jackelyn Knife notified - order received.

## 2015-06-07 NOTE — Discharge Summary (Signed)
Physician Discharge Summary  Patient ID: Christine Beard MRN: [id number] DOB/AGE: November 15, 1990 23 y.o.  Admit date: 06/05/2015 Discharge date: 06/07/2015  Admission Diagnoses:  Postpartum preeclampsia  Discharge Diagnoses:  Same Active Problems:   Preeclampsia in postpartum period   Discharged Condition: good  Hospital Course: Pt admitted by Dr. Ambrose Mantle, put on Magnesium sulfate, BP treated with PO Labetalol.  She had excellent diuresis, BP improved, headache improved.  Magnesium stopped after about 30 hours, remained stable overnight.   Discharge Exam: Blood pressure 110/59, pulse 101, temperature 98.6 F (37 C), temperature source Oral, resp. rate 16, height  (1.448 m), weight 56.836 kg (125 lb 4.8 oz), SpO2 99 %, unknown if currently breastfeeding. General appearance: alert  Disposition: 01-Home or Self Care  Discharge Instructions    Diet - low sodium heart healthy    Complete by:  As directed             Medication List    STOP taking these medications        DICLEGIS 10-10 MG Tbec  Generic drug:  Doxylamine-Pyridoxine      TAKE these medications        CALCIUM PO  Take 1 tablet by mouth daily.     ibuprofen 600 MG tablet  Commonly known as:  ADVIL,MOTRIN  Take 1 tablet (600 mg total) by mouth every 6 (six) hours as needed.     IRON PO  Take 1 tablet by mouth daily.     labetalol 100 MG tablet  Commonly known as:  NORMODYNE  Take 1 tablet (100 mg total) by mouth 2 (two) times daily.     oxyCODONE-acetaminophen 5-325 MG per tablet  Commonly known as:  PERCOCET/ROXICET  Take 1 tablet by mouth every 6 (six) hours as needed for moderate pain (pain scale 7 or less).     prenatal multivitamin Tabs tablet  Take 1 tablet by mouth daily at 12 noon.           Follow-up Information    Follow up with Bing Plume, MD. Schedule an appointment as soon as possible for a visit in 3 days.   Specialty:  Obstetrics and Gynecology   Contact information:   [address] [place] [address] [phone]       Signed: Zenaida Niece 06/07/2015, 9:07 AM

## 2015-06-11 ENCOUNTER — Emergency Department (HOSPITAL_COMMUNITY): Payer: Medicaid Other

## 2015-06-11 ENCOUNTER — Emergency Department (HOSPITAL_COMMUNITY)
Admission: EM | Admit: 2015-06-11 | Discharge: 2015-06-11 | Disposition: A | Discharge status: Home / Self Care | Payer: Medicaid Other | Attending: Emergency Medicine | Admitting: Emergency Medicine

## 2015-06-11 ENCOUNTER — Encounter (HOSPITAL_COMMUNITY): Payer: Self-pay | Admitting: *Deleted

## 2015-06-11 DIAGNOSIS — Z87891 Personal history of nicotine dependence: Secondary | ICD-10-CM | POA: Insufficient documentation

## 2015-06-11 DIAGNOSIS — O9089 Other complications of the puerperium, not elsewhere classified: Secondary | ICD-10-CM | POA: Diagnosis not present

## 2015-06-11 DIAGNOSIS — Z3202 Encounter for pregnancy test, result negative: Secondary | ICD-10-CM | POA: Diagnosis not present

## 2015-06-11 DIAGNOSIS — Z79899 Other long term (current) drug therapy: Secondary | ICD-10-CM | POA: Insufficient documentation

## 2015-06-11 DIAGNOSIS — Z8639 Personal history of other endocrine, nutritional and metabolic disease: Secondary | ICD-10-CM | POA: Insufficient documentation

## 2015-06-11 DIAGNOSIS — R51 Headache: Secondary | ICD-10-CM | POA: Insufficient documentation

## 2015-06-11 DIAGNOSIS — Z8659 Personal history of other mental and behavioral disorders: Secondary | ICD-10-CM | POA: Insufficient documentation

## 2015-06-11 DIAGNOSIS — R519 Headache, unspecified: Secondary | ICD-10-CM

## 2015-06-11 LAB — URINALYSIS, ROUTINE W REFLEX MICROSCOPIC
BILIRUBIN URINE: NEGATIVE
Glucose, UA: NEGATIVE mg/dL
KETONES UR: NEGATIVE mg/dL
NITRITE: NEGATIVE
PROTEIN: NEGATIVE mg/dL
SPECIFIC GRAVITY, URINE: 1.014 (ref 1.005–1.030)
UROBILINOGEN UA: 0.2 mg/dL (ref 0.0–1.0)
pH: 6 (ref 5.0–8.0)

## 2015-06-11 LAB — CBC WITH DIFFERENTIAL/PLATELET
Basophils Absolute: 0 10*3/uL (ref 0.0–0.1)
Basophils Relative: 0 % (ref 0–1)
EOS ABS: 0.1 10*3/uL (ref 0.0–0.7)
EOS PCT: 1 % (ref 0–5)
HCT: 39.8 % (ref 36.0–46.0)
Hemoglobin: 12.9 g/dL (ref 12.0–15.0)
LYMPHS ABS: 1.5 10*3/uL (ref 0.7–4.0)
LYMPHS PCT: 13 % (ref 12–46)
MCH: 30 pg (ref 26.0–34.0)
MCHC: 32.4 g/dL (ref 30.0–36.0)
MCV: 92.6 fL (ref 78.0–100.0)
MONO ABS: 0.8 10*3/uL (ref 0.1–1.0)
Monocytes Relative: 8 % (ref 3–12)
Neutro Abs: 8.5 10*3/uL — ABNORMAL HIGH (ref 1.7–7.7)
Neutrophils Relative %: 78 % — ABNORMAL HIGH (ref 43–77)
PLATELETS: 175 10*3/uL (ref 150–400)
RBC: 4.3 MIL/uL (ref 3.87–5.11)
RDW: 13.8 % (ref 11.5–15.5)
WBC: 10.9 10*3/uL — AB (ref 4.0–10.5)

## 2015-06-11 LAB — HEPATIC FUNCTION PANEL
ALT: 20 U/L (ref 14–54)
AST: 16 U/L (ref 15–41)
Albumin: 3.5 g/dL (ref 3.5–5.0)
Alkaline Phosphatase: 110 U/L (ref 38–126)
BILIRUBIN TOTAL: 0.7 mg/dL (ref 0.3–1.2)
Total Protein: 6.8 g/dL (ref 6.5–8.1)

## 2015-06-11 LAB — URINE MICROSCOPIC-ADD ON

## 2015-06-11 LAB — BASIC METABOLIC PANEL
Anion gap: 8 (ref 5–15)
BUN: 10 mg/dL (ref 6–20)
CALCIUM: 9.4 mg/dL (ref 8.9–10.3)
CO2: 22 mmol/L (ref 22–32)
CREATININE: 0.59 mg/dL (ref 0.44–1.00)
Chloride: 108 mmol/L (ref 101–111)
GFR calc Af Amer: >60 mL/min (ref >60)
GLUCOSE: 86 mg/dL (ref 65–99)
POTASSIUM: 4.2 mmol/L (ref 3.5–5.1)
SODIUM: 138 mmol/L (ref 135–145)

## 2015-06-11 LAB — HCG, SERUM, QUALITATIVE: PREG SERUM: NEGATIVE

## 2015-06-11 MED ORDER — PROCHLORPERAZINE EDISYLATE 5 MG/ML IJ SOLN
10.0000 mg | Freq: Once | INTRAMUSCULAR | Status: AC
  Administered 2015-06-11: 10 mg via INTRAVENOUS
  Filled 2015-06-11: qty 2

## 2015-06-11 MED ORDER — DIPHENHYDRAMINE HCL 50 MG/ML IJ SOLN
25.0000 mg | Freq: Once | INTRAMUSCULAR | Status: AC
  Administered 2015-06-11: 25 mg via INTRAVENOUS
  Filled 2015-06-11: qty 1

## 2015-06-11 MED ORDER — PROCHLORPERAZINE MALEATE 10 MG PO TABS: 10.0000 mg | ORAL_TABLET | Freq: Two times a day (BID) | ORAL | Status: DC | PRN

## 2015-06-11 MED ORDER — KETOROLAC TROMETHAMINE 30 MG/ML IJ SOLN
30.0000 mg | Freq: Once | INTRAMUSCULAR | Status: AC
  Administered 2015-06-11: 30 mg via INTRAVENOUS
  Filled 2015-06-11: qty 1

## 2015-06-11 MED ORDER — SODIUM CHLORIDE 0.9 % IV BOLUS (SEPSIS)
1000.0000 mL | Freq: Once | INTRAVENOUS | Status: AC
  Administered 2015-06-11: 1000 mL via INTRAVENOUS

## 2015-06-11 NOTE — ED Notes (Signed)
Informed the pt that a urine specimen is needed. 

## 2015-06-11 NOTE — ED Notes (Addendum)
Hx of headaches.OBGYN doctor Dr Ambrose Mantle from [facility]. Pt reports she had non complicated vaginal delivery on 8/23 pt discharged a few days later. Pt went to [facility] ED on Tues 8/30 for headache, on Wednesday 8/31 pts OBGYN admitted pt for preeclampsia (headache and feet swelling). Started on BP meds and still on meds. Pt discharged on 9/2. Pt has had constant headache since 8/30, but pain has increased over time. Pain 10/10 at present. Pt went to OBGYN today, doctor does not think headache is related to preeclampsia, told pt to come to ED. Pt has hx of hypothyroidism, not been on medication in 3 years. Thyroid levels checked last week.   Pt denies blurry vision or dizziness. Reports sensitivity to sound. Headache since 8/30 with pain increasing, pain 10/10 now. Has taken ibuprofen at 1000 with no relief.

## 2015-06-11 NOTE — ED Provider Notes (Signed)
CSN: [id number]     Arrival date & time 06/11/15  1453 History   First MD Initiated Contact with Patient 06/11/15 1539     Chief Complaint  Patient presents with  . Headache, 2 week post delivery      (Consider location/radiation/quality/duration/timing/severity/associated sxs/prior Treatment) HPI 24 year old female who presents with headache. She is 2 weeks postpartum from normal vaginal delivery, that was complicated by preeclampsia in the postpartum period. She was admitted at that time and treated adequately. Hypertension has resolved, but she has had persistent daily headaches, worse over the course of the past week. Reports having migraine headaches in the past, but this is different in length and severity. Headache throbbing, unilateral on the right side, associated with phonophobia. Has been taking ibuprofen for this pain multiple times daily over the past several days. Headache gradually worsening to 10/10 in severity. Denies any associating nausea, vomiting, vision changes, speech changes, focal weakness or numbness, vertigo or ataxia. She has not had any fevers or neck pain. Denies any recent illnesses including upper respiratory, GI, or urinary symptoms. Was seen by her OB physician today and sent to the ED for evaluation of her ongoing headache.   Past Medical History  Diagnosis Date  . Thyroid disease   . Hypothyroidism   . Anxiety   . Depression   . Headache    Past Surgical History  Procedure Laterality Date  . Tonsillectomy    . Toe surgery     Family History  Problem Relation Age of Onset  . Diabetes Maternal Grandmother   . Diabetes Paternal Grandmother    Social History  Substance Use Topics  . Smoking status: Former Smoker    Quit date: 09/03/2014  . Smokeless tobacco: Never Used  . Alcohol Use: No   OB History    Gravida Para Term Preterm AB TAB SAB Ectopic Multiple Living   1 1 1       0 1     Review of Systems 10/14 systems reviewed and are negative  other than those stated in the HPI  Allergies  Codeine  Home Medications   Prior to Admission medications   Medication Sig Start Date End Date Taking? Authorizing Provider  CALCIUM PO Take 1 tablet by mouth daily.   Yes Historical Provider, MD  ibuprofen (ADVIL,MOTRIN) 600 MG tablet Take 1 tablet (600 mg total) by mouth every 6 (six) hours as needed. Patient taking differently: Take 600 mg by mouth every 6 (six) hours as needed for headache, mild pain or moderate pain.  05/29/15  Yes Tracey Harries, MD  IRON PO Take 1 tablet by mouth daily.   Yes Historical Provider, MD  labetalol (NORMODYNE) 100 MG tablet Take 1 tablet (100 mg total) by mouth 2 (two) times daily. 06/04/15  Yes Judeth Horn, NP  oxyCODONE-acetaminophen (PERCOCET/ROXICET) 5-325 MG per tablet Take 1 tablet by mouth every 6 (six) hours as needed for moderate pain (pain scale 7 or less). 05/29/15  Yes Tracey Harries, MD  Prenatal Vit-Fe Fumarate-FA (PRENATAL MULTIVITAMIN) TABS tablet Take 1 tablet by mouth daily at 12 noon.   Yes Historical Provider, MD  prochlorperazine (COMPAZINE) 10 MG tablet Take 1 tablet (10 mg total) by mouth 2 (two) times daily as needed for nausea or vomiting (headache). 06/11/15   Lavera Guise, MD   BP 135/71 mmHg  Pulse 89  Temp(Src) 98.4 F (36.9 C) (Oral)  Resp 16  SpO2 98% Physical Exam Physical Exam  Nursing note and vitals reviewed. Constitutional:  Well developed, well nourished, non-toxic, and in no acute distress Head: Normocephalic and atraumatic.  Mouth/Throat: Oropharynx is clear and moist.  Neck: Normal range of motion. Neck supple. No meningismus. Cardiovascular: Normal rate and regular rhythm.   Pulmonary/Chest: Effort normal and breath sounds normal.  Abdominal: Soft. There is no tenderness. There is no rebound and no guarding.  Musculoskeletal: Normal range of motion.  Skin: Skin is warm and dry.  Psychiatric: Cooperative Neurological:  Alert, oriented to person, place, time, and  situation. Memory grossly in tact. Fluent speech. No dysarthria or aphasia.  Cranial nerves: VF are full. Pupils are symmetric, and reactive to light. EOMI without nystagmus. No gaze deviation. Facial muscles symmetric with activation. Sensation to light touch over face in tact bilaterally. Hearing grossly in tact. Palate elevates symmetrically. Head turn and shoulder shrug are intact. Tongue midline.  Reflexes +2 patellar and achilles. Muscle bulk and tone normal. No pronator drift. Moves all extremities symmetrically. Sensation to light touch is in tact throughout in bilateral upper and lower extremities. Coordination reveals no dysmetria with finger to nose. Gait is narrow-based and steady. Non-ataxic.   ED Course  Procedures (including critical care time) Labs Review Labs Reviewed  CBC WITH DIFFERENTIAL/PLATELET - Abnormal; Notable for the following:    WBC 10.9 (*)    Neutrophils Relative % 78 (*)    Neutro Abs 8.5 (*)    All other components within normal limits  HEPATIC FUNCTION PANEL - Abnormal; Notable for the following:    Bilirubin, Direct <0.1 (*)    All other components within normal limits  URINALYSIS, ROUTINE W REFLEX MICROSCOPIC (NOT AT [facility]) - Abnormal; Notable for the following:    APPearance CLOUDY (*)    Hgb urine dipstick LARGE (*)    Leukocytes, UA LARGE (*)    All other components within normal limits  BASIC METABOLIC PANEL  HCG, SERUM, QUALITATIVE  URINE MICROSCOPIC-ADD ON    Imaging Review Mr Mrv Head Wo Cm  06/11/2015   CLINICAL DATA:  Headache. Two weeks postpartum with preeclampsia in the postpartum period.  EXAM: MR MRV HEAD WITHOUT CONTRAST  TECHNIQUE: Multiplanar, multisequence MR imaging was performed. No intravenous contrast was administered.  COMPARISON:  None.  FINDINGS: Superior sagittal sinus, internal cerebral veins, vein of Galen, straight sinus, right transverse sinus, right sigmoid sinus, and jugular bulbs are patent without evidence of  thrombus. Apparent lack of flow in the proximal right transverse sinus on MIP images is artifactual, without evidence of thrombus on the source images.  The left transverse and left sigmoid sinuses are hypoplastic with areas of signal dropout felt to be artifactual without discrete filling defects seen on source images to indicate thrombus.  IMPRESSION: No evidence of dural venous sinus thrombosis. 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   Electronically Signed   By: Sebastian Ache M.D.   On: 06/11/2015 17:22   I have personally reviewed and evaluated these images and lab results as part of my medical decision-making.   MDM   Final diagnoses:  Headache    24 year old female with history of migraine headaches in 2 weeks postpartum from a NSVD who presents with intractable headache. She is nontoxic and in no acute distress on presentation. Vital signs are non-concerning. Blood pressures have been normotensive in the ED, and presentation not felt to be consistent with preeclampsia. She is neurologically intact. Headache not of sudden onset maximal intensity, not concerning for subarachnoid hemorrhage. No infectious process, and no meningismus, and afebrile, and  not consistent with meningitis. In the setting of her recent pregnancy, concern for possible dural venous sinus thrombosis. However the character of her pain does seem consistent with migraines in the past, and she has been taking significant amounts of ibuprofen and may have analgesic induced headaches as well. MRV visualized and does not show thrombosis. She is given migraine cocktail with IV fluids, and on reevaluation she does state that her headache has improved to now a 4-5 out of 10 in severity headache. She feels comfortable managing the symptoms from home. Strict return and follow-up instructions are subsequently reviewed. She expected understanding of all discharge instructions and felt comfortable to plan of care.  Lavera Guise, MD 06/11/15 [phone]

## 2015-06-11 NOTE — ED Notes (Signed)
Patient transported to MRI 

## 2015-06-11 NOTE — Discharge Instructions (Signed)
General Headache Without Cause A general headache is pain or discomfort felt around the head or neck area. The cause may not be found.  HOME CARE   Keep all doctor visits.  Only take medicines as told by your doctor.  Lie down in a dark, quiet room when you have a headache.  Keep a journal to find out if certain things bring on headaches. For example, write down:  What you eat and drink.  How much sleep you get.  Any change to your diet or medicines.  Relax by getting a massage or doing other relaxing activities.  Put ice or heat packs on the head and neck area as told by your doctor.  Lessen stress.  Sit up straight. Do not tighten (tense) your muscles.  Quit smoking if you smoke.  Lessen how much alcohol you drink.  Lessen how much caffeine you drink, or stop drinking caffeine.  Eat and sleep on a regular schedule.  Get 7 to 9 hours of sleep, or as told by your doctor.  Keep lights dim if bright lights bother you or make your headaches worse. GET HELP RIGHT AWAY IF:   Your headache becomes really bad.  You have a fever.  You have a stiff neck.  You have trouble seeing.  Your muscles are weak, or you lose muscle control.  You lose your balance or have trouble walking.  You feel like you will pass out (faint), or you pass out.  You have really bad symptoms that are different than your first symptoms.  You have problems with the medicines given to you by your doctor.  Your medicines do not work.  Your headache feels different than the other headaches.  You feel sick to your stomach (nauseous) or throw up (vomit). MAKE SURE YOU:   Understand these instructions.  Will watch your condition.  Will get help right away if you are not doing well or get worse. Document Released: 06/29/2008 Document Revised: 12/13/2011 Document Reviewed: 09/10/2011 [facility] Patient Information 2015 [place], [place]. This information is not intended to replace advice given  to you by your health care provider. Make sure you discuss any questions you have with your health care provider.  Migraine Headache A migraine headache is an intense, throbbing pain on one or both sides of your head. A migraine can last for 30 minutes to several hours. CAUSES  The exact cause of a migraine headache is not always known. However, a migraine may be caused when nerves in the brain become irritated and release chemicals that cause inflammation. This causes pain. Certain things may also trigger migraines, such as:  Alcohol.  Smoking.  Stress.  Menstruation.  Aged cheeses.  Foods or drinks that contain nitrates, glutamate, aspartame, or tyramine.  Lack of sleep.  Chocolate.  Caffeine.  Hunger.  Physical exertion.  Fatigue.  Medicines used to treat chest pain (nitroglycerine), birth control pills, estrogen, and some blood pressure medicines. SIGNS AND SYMPTOMS  Pain on one or both sides of your head.  Pulsating or throbbing pain.  Severe pain that prevents daily activities.  Pain that is aggravated by any physical activity.  Nausea, vomiting, or both.  Dizziness.  Pain with exposure to bright lights, loud noises, or activity.  General sensitivity to bright lights, loud noises, or smells. Before you get a migraine, you may get warning signs that a migraine is coming (aura). An aura may include:  Seeing flashing lights.  Seeing bright spots, halos, or zigzag lines.  Having tunnel vision or blurred vision.  Having feelings of numbness or tingling.  Having trouble talking.  Having muscle weakness. DIAGNOSIS  A migraine headache is often diagnosed based on:  Symptoms.  Physical exam.  A CT scan or MRI of your head. These imaging tests cannot diagnose migraines, but they can help rule out other causes of headaches. TREATMENT Medicines may be given for pain and nausea. Medicines can also be given to help prevent recurrent migraines.  HOME  CARE INSTRUCTIONS  Only take over-the-counter or prescription medicines for pain or discomfort as directed by your health care provider. The use of long-term narcotics is not recommended.  Lie down in a dark, quiet room when you have a migraine.  Keep a journal to find out what may trigger your migraine headaches. For example, write down:  What you eat and drink.  How much sleep you get.  Any change to your diet or medicines.  Limit alcohol consumption.  Quit smoking if you smoke.  Get 7-9 hours of sleep, or as recommended by your health care provider.  Limit stress.  Keep lights dim if bright lights bother you and make your migraines worse. SEEK IMMEDIATE MEDICAL CARE IF:   Your migraine becomes severe.  You have a fever.  You have a stiff neck.  You have vision loss.  You have muscular weakness or loss of muscle control.  You start losing your balance or have trouble walking.  You feel faint or pass out.  You have severe symptoms that are different from your first symptoms. MAKE SURE YOU:   Understand these instructions.  Will watch your condition.  Will get help right away if you are not doing well or get worse. Document Released: 09/20/2005 Document Revised: 02/04/2014 Document Reviewed: 05/28/2013 [facility] Patient Information 2015 [place], [place]. This information is not intended to replace advice given to you by your health care provider. Make sure you discuss any questions you have with your health care provider.   Emergency Department Resource Guide 1) Find a Doctor and Pay Out of Pocket Although you won't have to find out who is covered by your insurance plan, it is a good idea to ask around and get recommendations. You will then need to call the office and see if the doctor you have chosen will accept you as a new patient and what types of options they offer for patients who are self-pay. Some doctors offer discounts or will set up payment plans for  their patients who do not have insurance, but you will need to ask so you aren't surprised when you get to your appointment.  2) Contact Your Local Health Department Not all health departments have doctors that can see patients for sick visits, but many do, so it is worth a call to see if yours does. If you don't know where your local health department is, you can check in your phone book. The CDC also has a tool to help you locate your state's health department, and many state websites also have listings of all of their local health departments.  3) Find a Walk-in Clinic If your illness is not likely to be very severe or complicated, you may want to try a walk in clinic. These are popping up all over the country in pharmacies, drugstores, and shopping centers. They're usually staffed by nurse practitioners or physician assistants that have been trained to treat common illnesses and complaints. They're usually fairly quick and inexpensive. However, if you have serious  medical issues or chronic medical problems, these are probably not your best option.  No Primary Care Doctor: - Call Health Connect at  [phone] - they can help you locate a primary care doctor that  accepts your insurance, provides certain services, etc. - Physician Referral Service- [phone]  Chronic Pain Problems: Organization         Address  Phone   Notes  [facility]  [phone] Patients need to be referred by their primary care doctor.   Medication Assistance: Organization         Address  Phone   Notes  [facility] Medication [facility] [address] [place] [place], [place] [address] [phone] --Must be a resident of [facility] -- Must have NO insurance coverage whatsoever (no Medicaid/ Medicare, etc.) -- The pt. MUST have a primary care doctor that directs their care regularly and follows them in the community   [facility]  [phone]   [employer]  [phone]    Agencies that provide inexpensive medical care: Organization         Address  Phone   Notes  [facility]  [phone]   [facility]    [phone]   [facility] [address] [place], [place] [address] [phone]   [facility] [address] [place], [place] [phone]   Planned Parenthood    [phone]   [facility]    [phone]   [facility]  [address], [place] Phone:  [phone], Fax:  [phone] Hours of Operation:  9 am - 6 pm, M-F.  Also accepts Medicaid/Medicare and self-pay.  [facility]  [address], [place] Phone: [phone], Fax: [phone]. Hours of Operation:  8:30 am - 5:30 pm, M-F.  Also accepts Medicaid and self-pay.  [facility] [place] [address], [place] Phone: [phone]   [facility] [address] Natasha Bence [place], [place] [phone], Ext. [phone] Mondays & Thursdays: 7-9 AM.  First 15 patients are seen on a first come, first serve basis.    Medicaid-accepting [facility] Providers:  Organization         Address  Phone   Notes  [facility] [address], [place] [phone] Also accepts self-pay patients.  [facility] [address] Laurell Josephs [place], [place]  [phone]   [facility] [address], [place] [phone]   [facility] [address], [place] [phone]   Renaye Rakers [address], [place]   [phone] Only accepts [place] Access [place] patients after they have their name applied to their card.   Self-Pay (no insurance) in [facility]:  Organization         Address  Phone   Notes  Sickle Cell Patients, [facility] [address] [place], [place] [phone]   [facility] [address] [place], [place] [phone]   [facility] [place]  [address], [place] [phone]   [facility]/Dr. Osei-Bonsu  [address] [place] or [address], [place] [phone] Phone number for both [place] and [place] locations is  the same.  [facility] [address] [place] [phone]   [facility] [address] [place] or [address] [phone]   [facility] [address], [place] [phone], phone; [phone], fax Sees patients 1st and 3rd Saturday of every month.  Must not qualify for public or private insurance (i.e. Medicaid, Medicare, [place] Health Choice, Veterans' Benefits)  Household income should be no more than 200% of the poverty level The clinic cannot treat you if you are pregnant or think you are pregnant  Sexually transmitted diseases are not treated at the clinic.    Dental Care: Organization         Address  Phone  Notes  [facility] of [facility] [place] [phone] Accepts children up to age 16 who are enrolled in [place] or [place]; pregnant women with a Medicaid card; and children who have applied for Medicaid or [place] Health Choice, but were declined, whose parents can pay a reduced fee at time of service.  [facility] Department of [facility]  [address], [place] [phone] Accepts children up to age 49 who are enrolled in [place] or [place]; pregnant women with a Medicaid card; and children who have applied for Medicaid or  Health Choice, but were declined, whose parents can pay a reduced fee at time of service.  [facility]  [place] [phone] Patients are  seen by appointment only. Walk-ins are not accepted. [place] will see patients 26 years of age and older. Monday - Tuesday (8am-5pm) Most Wednesdays (8:30-5pm) $30 per visit, cash only  [facility] Adult [facility]  [address], [facility] [phone] Patients are seen by appointment only. Walk-ins are not accepted. [place] will see patients 39 years of age and older. One Wednesday Evening (Monthly: Volunteer Based).  $30 per visit, cash only  [place]  [phone] for adults; Children under age 33, call Graduate Pediatric Dentistry at [phone]. Children aged 3-14, please call [phone] to request a pediatric application.  Dental services are provided in all areas of dental care including fillings, crowns and bridges, complete and partial dentures, implants, gum treatment, root canals, and extractions. Preventive care is also provided. Treatment is provided to both adults and children. Patients are selected via a lottery and there is often a waiting list.   [facility] [address] [place]  [phone] [email or web address]   [facility] [address] [place], [place] [phone], Ext. [phone] Second and Fourth Thursday of each month, opens at 6:30 AM; Clinic ends at 9 AM.  Patients are seen on a first-come first-served basis, and a limited number are seen during each clinic.   [facility]  [address] Hillard Danker [place], [place] [phone]   Eligibility Requirements You must have lived in [place], [place], or [place] counties for at least the last three months.   You cannot be eligible for state or federal sponsored [employer], including [employer], [place], or [employer].   You generally cannot be eligible for healthcare insurance through your employer.    How to apply: Eligibility screenings are held every Tuesday and Wednesday afternoon from 1:00 pm until 4:00  pm. You do not need an appointment  for the interview!  [facility] [address], [place], [place] [phone]   [facility] Health Department  [phone]   [facility] Health Department  [phone]   [facility] Health Department  [phone]

## 2015-06-11 NOTE — Progress Notes (Signed)
EDCM spoke to patient and her mother in law at bedside.  Patient confirms she has Medicaid insurance without a pcp.  Patient's mother in law went on to say that patient has "pregnancy Medicaid."  [facility] provided patient with list of pcps who accept Medicaid insurance in [place].  EDCM also provided patient with phone number to DSS to confirm type of Medicaid she has.  EDCM also provided patient with pamphlet to [facility], informed patient of services there.  EDCM also provided patient with list of pcps who accept self pay patients, list of discount pharmacies and websites needymeds.org and GoodRX.com for medication assistance, phone number to inquire about the orange card, phone number to inquire about Mediciad, phone number to inquire about the Affordable Care Act, financial resources in the community such as local churches, salvation army, urban ministries, and dental assistance for uninsured patients.  Patient thankful for resources.  No further EDCM needs at this time.

## 2015-07-16 ENCOUNTER — Other Ambulatory Visit: Payer: Self-pay | Admitting: Obstetrics & Gynecology

## 2016-01-16 ENCOUNTER — Encounter (HOSPITAL_COMMUNITY): Payer: Self-pay | Admitting: *Deleted

## 2016-01-16 ENCOUNTER — Ambulatory Visit (HOSPITAL_COMMUNITY)
Admission: EM | Admit: 2016-01-16 | Discharge: 2016-01-16 | Disposition: A | Discharge status: Home / Self Care | Payer: Medicaid Other | Attending: Emergency Medicine | Admitting: Emergency Medicine

## 2016-01-16 DIAGNOSIS — J0100 Acute maxillary sinusitis, unspecified: Secondary | ICD-10-CM

## 2016-01-16 DIAGNOSIS — H109 Unspecified conjunctivitis: Secondary | ICD-10-CM

## 2016-01-16 MED ORDER — AMOXICILLIN 500 MG PO CAPS: 500.0000 mg | ORAL_CAPSULE | Freq: Two times a day (BID) | ORAL | Status: DC

## 2016-01-16 MED ORDER — POLYMYXIN B-TRIMETHOPRIM 10000-0.1 UNIT/ML-% OP SOLN: 1.0000 [drp] | Freq: Four times a day (QID) | OPHTHALMIC | Status: DC

## 2016-01-16 NOTE — Discharge Instructions (Signed)
You have either a viral or bacterial eye infection. I am going to cover you with antibiotic drops. Also keep clean with a warm washcloth. This will help. The antibiotics will help overall, though some of your symtpoms may be viral. Suggest use  of Ibuprofen, Mucinex Max strength, Claritin  daily as directed. Delsym will help with cough. All of these will help your symptoms. Feel better. Drink plenty of liquids and rest.  Bacterial Conjunctivitis Bacterial conjunctivitis (commonly called pink eye) is redness, soreness, or puffiness (inflammation) of the white part of your eye. It is caused by a germ called bacteria. These germs can easily spread from person to person (contagious). Your eye often will become red or pink. Your eye may also become irritated, watery, or have a thick discharge.  HOME CARE   Apply a cool, clean washcloth over closed eyelids. Do this for 10-20 minutes, 3-4 times a day while you have pain.  Gently wipe away any fluid coming from the eye with a warm, wet washcloth or cotton ball.  Wash your hands often with soap and water. Use paper towels to dry your hands.  Do not share towels or washcloths.  Change or wash your pillowcase every day.  Do not use eye makeup until the infection is gone.  Do not use machines or drive if your vision is blurry.  Stop using contact lenses. Do not use them again until your doctor says it is okay.  Do not touch the tip of the eye drop bottle or medicine tube with your fingers when you put medicine on the eye. GET HELP RIGHT AWAY IF:   Your eye is not better after 3 days of starting your medicine.  You have a yellowish fluid coming out of the eye.  You have more pain in the eye.  Your eye redness is spreading.  Your vision becomes blurry.  You have a fever or lasting symptoms for more than 2-3 days.  You have a fever and your symptoms suddenly get worse.  You have pain in the face.  Your face gets red or puffy  (swollen). MAKE SURE YOU:   Understand these instructions.  Will watch this condition.  Will get help right away if you are not doing well or get worse.   This information is not intended to replace advice given to you by your health care provider. Make sure you discuss any questions you have with your health care provider.   Document Released: 06/29/2008 Document Revised: 09/06/2012 Document Reviewed: 05/26/2012 Elsevier Interactive Patient Education 2016 Elsevier Inc.  Sinus Rinse WHAT IS A SINUS RINSE? A sinus rinse is a simple home treatment that is used to rinse your sinuses with a sterile mixture of salt and water (saline solution). Sinuses are air-filled spaces in your skull behind the bones of your face and forehead that open into your nasal cavity. You will use the following:  Saline solution.  Neti pot or spray bottle. This releases the saline solution into your nose and through your sinuses. Neti pots and spray bottles can be purchased at [employer], a health food store, or online. WHEN WOULD I DO A SINUS RINSE? A sinus rinse can help to clear mucus, dirt, dust, or pollen from the nasal cavity. You may do a sinus rinse when you have a cold, a virus, nasal allergy symptoms, a sinus infection, or stuffiness in the nose or sinuses. If you are considering a sinus rinse:  Ask your child's health care provider  before performing a sinus rinse on your child.  Do not do a sinus rinse if you have had ear or nasal surgery, ear infection, or blocked ears. HOW DO I DO A SINUS RINSE?  Wash your hands.  Disinfect your device according to the directions provided and then dry it.  Use the solution that comes with your device or one that is sold separately in stores. Follow the mixing directions on the package.  Fill your device with the amount of saline solution as directed by the device instructions.  Stand over a sink and tilt your head sideways over the sink.  Place  the spout of the device in your upper nostril (the one closer to the ceiling).  Gently pour or squeeze the saline solution into the nasal cavity. The liquid should drain to the lower nostril if you are not overly congested.  Gently blow your nose. Blowing too hard may cause ear pain.  Repeat in the other nostril.  Clean and rinse your device with clean water and then air-dry it. ARE THERE RISKS OF A SINUS RINSE?  Sinus rinse is generally very safe and effective. However, there are a few risks, which include:   A burning sensation in the sinuses. This may happen if you do not make the saline solution as directed. Make sure to follow all directions when making the saline solution.  Infection from contaminated water. This is rare, but possible.  Nasal irritation.   This information is not intended to replace advice given to you by your health care provider. Make sure you discuss any questions you have with your health care provider.   Document Released: 04/17/2014 Document Reviewed: 04/17/2014 Elsevier Interactive Patient Education [employer] Elsevier Inc.

## 2016-01-16 NOTE — ED Notes (Signed)
Pt  Has  Symptoms  Of cough  /  Congestion        sorethroat    Runny  Nose  And  Watery  Yes   With   Onset   Of  1   Week         pt  Reports   Symptoms  Not  releived  By  [facility]  meds

## 2016-01-16 NOTE — ED Provider Notes (Signed)
CSN: [id number]     Arrival date & time 01/16/16  1410 History   First MD Initiated Contact with Patient 01/16/16 1701     Chief Complaint  Patient presents with  . Cough   (Consider location/radiation/quality/duration/timing/severity/associated sxs/prior Treatment) HPI Comments: Patient presents with a 6-7 day history of sinus drainage, congestion, sore throat and "crusty" eyes. She also has a daughter with conjunctivitis. She has mild cough, and subjective fevers. Malaise.   Patient is a 25 y.o. female presenting with cough. The history is provided by the patient.  Cough Associated symptoms: eye discharge, fever, rhinorrhea and sore throat   Associated symptoms: no chills, no shortness of breath and no wheezing     Past Medical History  Diagnosis Date  . Thyroid disease   . Hypothyroidism   . Anxiety   . Depression   . Headache    Past Surgical History  Procedure Laterality Date  . Tonsillectomy    . Toe surgery     Family History  Problem Relation Age of Onset  . Diabetes Maternal Grandmother   . Diabetes Paternal Grandmother    Social History  Substance Use Topics  . Smoking status: Former Smoker    Quit date: 09/03/2014  . Smokeless tobacco: Never Used  . Alcohol Use: No   OB History    Gravida Para Term Preterm AB TAB SAB Ectopic Multiple Living   0 1     Review of Systems  Constitutional: Positive for fever and fatigue. Negative for chills.  HENT: Positive for congestion, postnasal drip, rhinorrhea, sinus pressure and sore throat.   Eyes: Positive for discharge, redness and itching.  Respiratory: Positive for cough. Negative for shortness of breath and wheezing.   Allergic/Immunologic: Negative.   Psychiatric/Behavioral: Negative.     Allergies  Codeine  Home Medications   Prior to Admission medications   Medication Sig Start Date End Date Taking? Authorizing Provider  amoxicillin (AMOXIL) 500 MG capsule Take 1 capsule (500 mg total) by  mouth 2 (two) times daily. 01/16/16   Riki Sheer, PA-C  CALCIUM PO Take 1 tablet by mouth daily.    Historical Provider, MD  ibuprofen (ADVIL,MOTRIN) 600 MG tablet Take 1 tablet (600 mg total) by mouth every 6 (six) hours as needed. Patient taking differently: Take 600 mg by mouth every 6 (six) hours as needed for headache, mild pain or moderate pain.  05/29/15   Tracey Harries, MD  IRON PO Take 1 tablet by mouth daily.    Historical Provider, MD  labetalol (NORMODYNE) 100 MG tablet Take 1 tablet (100 mg total) by mouth 2 (two) times daily. 06/04/15   Judeth Horn, NP  oxyCODONE-acetaminophen (PERCOCET/ROXICET) 5-325 MG per tablet Take 1 tablet by mouth every 6 (six) hours as needed for moderate pain (pain scale 7 or less). 05/29/15   Tracey Harries, MD  Prenatal Vit-Fe Fumarate-FA (PRENATAL MULTIVITAMIN) TABS tablet Take 1 tablet by mouth daily at 12 noon.    Historical Provider, MD  prochlorperazine (COMPAZINE) 10 MG tablet Take 1 tablet (10 mg total) by mouth 2 (two) times daily as needed for nausea or vomiting (headache). 06/11/15   Lavera Guise, MD  trimethoprim-polymyxin b (POLYTRIM) ophthalmic solution Place 1 drop into both eyes every 6 (six) hours. 01/16/16   Riki Sheer, PA-C   Meds Ordered and Administered this Visit  Medications - No data to display  BP 135/82 mmHg  Pulse 93  Temp(Src) 98  F (36.7 C) (Oral)  SpO2 100% No data found.   Physical Exam  Constitutional: She is oriented to person, place, and time. She appears well-developed and well-nourished. No distress.  HENT:  Head: Normocephalic and atraumatic.  Right Ear: External ear normal.  Left Ear: External ear normal.  Mouth/Throat: No oropharyngeal exudate.  Mild oropharyngeal injection. No exudate. Turbinates are inflammed and erythematous bilaterally. 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   Eyes: Pupils are equal, round, and reactive to light. Right eye exhibits discharge. Left eye exhibits discharge.  Copious  yellow discharge to both corners of eyes, injected sclera, and beefy red conjunctiva bilaterally  Neck: Normal range of motion. Neck supple.  Cardiovascular: Normal rate.   Pulmonary/Chest: Effort normal and breath sounds normal. No respiratory distress. She has no wheezes. She has no rales.  Lymphadenopathy:    She has no cervical adenopathy.  Neurological: She is alert and oriented to person, place, and time.  Skin: Skin is warm and dry. No rash noted. She is not diaphoretic.  Psychiatric: Her behavior is normal.  Nursing note and vitals reviewed.   ED Course  Procedures (including critical care time)  Labs Review Labs Reviewed - No data to display  Imaging Review No results found.   Visual Acuity Review  Right Eye Distance:   Left Eye Distance:   Bilateral Distance:    Right Eye Near:   Left Eye Near:    Bilateral Near:         MDM   1. Subacute maxillary sinusitis   2. Bilateral conjunctivitis    Treat with Amoxicillin give duration. Treat with polytrim given amount of copious discharge. Hygiene discussed. Fluids and supportive care with Claritin, mucinex and delsym. Motrin as needed. If worsens f/u in the ED, PCP or here.     Riki SheerMichelle G Raheel Kunkle, PA-C 01/16/16 1746

## 2017-02-20 ENCOUNTER — Inpatient Hospital Stay (HOSPITAL_COMMUNITY): Payer: Medicaid Other

## 2017-02-20 ENCOUNTER — Encounter (HOSPITAL_COMMUNITY): Payer: Self-pay

## 2017-02-20 ENCOUNTER — Inpatient Hospital Stay (HOSPITAL_COMMUNITY)
Admission: AD | Admit: 2017-02-20 | Discharge: 2017-02-20 | Disposition: A | Discharge status: Home / Self Care | Payer: Medicaid Other | Source: Ambulatory Visit | Attending: Obstetrics & Gynecology | Admitting: Obstetrics & Gynecology

## 2017-02-20 DIAGNOSIS — E039 Hypothyroidism, unspecified: Secondary | ICD-10-CM | POA: Insufficient documentation

## 2017-02-20 DIAGNOSIS — R109 Unspecified abdominal pain: Secondary | ICD-10-CM | POA: Diagnosis present

## 2017-02-20 DIAGNOSIS — Z3A11 11 weeks gestation of pregnancy: Secondary | ICD-10-CM | POA: Diagnosis not present

## 2017-02-20 DIAGNOSIS — O99341 Other mental disorders complicating pregnancy, first trimester: Secondary | ICD-10-CM | POA: Diagnosis not present

## 2017-02-20 DIAGNOSIS — O26891 Other specified pregnancy related conditions, first trimester: Secondary | ICD-10-CM | POA: Diagnosis not present

## 2017-02-20 DIAGNOSIS — Z87891 Personal history of nicotine dependence: Secondary | ICD-10-CM | POA: Insufficient documentation

## 2017-02-20 DIAGNOSIS — O99281 Endocrine, nutritional and metabolic diseases complicating pregnancy, first trimester: Secondary | ICD-10-CM | POA: Insufficient documentation

## 2017-02-20 DIAGNOSIS — Z79899 Other long term (current) drug therapy: Secondary | ICD-10-CM | POA: Diagnosis not present

## 2017-02-20 DIAGNOSIS — F419 Anxiety disorder, unspecified: Secondary | ICD-10-CM | POA: Diagnosis not present

## 2017-02-20 DIAGNOSIS — F329 Major depressive disorder, single episode, unspecified: Secondary | ICD-10-CM | POA: Diagnosis not present

## 2017-02-20 LAB — CBC
HEMATOCRIT: 39.6 % (ref 36.0–46.0)
Hemoglobin: 13.2 g/dL (ref 12.0–15.0)
MCH: 29.5 pg (ref 26.0–34.0)
MCHC: 33.3 g/dL (ref 30.0–36.0)
MCV: 88.6 fL (ref 78.0–100.0)
Platelets: 168 10*3/uL (ref 150–400)
RBC: 4.47 MIL/uL (ref 3.87–5.11)
RDW: 12.6 % (ref 11.5–15.5)
WBC: 8 10*3/uL (ref 4.0–10.5)

## 2017-02-20 LAB — URINALYSIS, ROUTINE W REFLEX MICROSCOPIC
Bilirubin Urine: NEGATIVE
GLUCOSE, UA: NEGATIVE mg/dL
HGB URINE DIPSTICK: NEGATIVE
KETONES UR: NEGATIVE mg/dL
NITRITE: NEGATIVE
PROTEIN: NEGATIVE mg/dL
Specific Gravity, Urine: 1.018 (ref 1.005–1.030)
pH: 6 (ref 5.0–8.0)

## 2017-02-20 LAB — POCT PREGNANCY, URINE: Preg Test, Ur: POSITIVE — AB

## 2017-02-20 LAB — WET PREP, GENITAL
Clue Cells Wet Prep HPF POC: NONE SEEN
SPERM: NONE SEEN
Trich, Wet Prep: NONE SEEN
Yeast Wet Prep HPF POC: NONE SEEN

## 2017-02-20 LAB — HCG, QUANTITATIVE, PREGNANCY: HCG, BETA CHAIN, QUANT, S: 32969 m[IU]/mL — AB (ref <5)

## 2017-02-20 NOTE — MAU Provider Note (Signed)
History     CSN: [id number]  Arrival date and time: 02/20/17 1416   First Provider Initiated Contact with Patient 02/20/17 1439      Chief Complaint  Patient presents with  . Abdominal Pain  . Nausea   HPI  Christine Beard 26 y.o. [redacted]w[redacted]d  Comes to MAU with abdominal pain and a positive pregnancy test at home.  Has diffuse pain below the umbilicus and pain on her left side.  Has not had vaginal bleeding and has not had medical appointments with this pregnancy.  Her child is now 50 years old and she had preeclampsia in the postpartum period treated with magnesium.  OB History    Gravida Para Term Preterm AB Living   2 1 1     1    SAB TAB Ectopic Multiple Live Births         0 1      Past Medical History:  Diagnosis Date  . Anxiety   . Depression   . Headache   . Hypothyroidism   . Thyroid disease     Past Surgical History:  Procedure Laterality Date  . TOE SURGERY    . TONSILLECTOMY      Family History  Problem Relation Age of Onset  . Diabetes Maternal Grandmother   . Diabetes Paternal Grandmother     Social History  Substance Use Topics  . Smoking status: Former Smoker    Quit date: 09/03/2014  . Smokeless tobacco: Never Used  . Alcohol use No    Allergies:  Allergies  Allergen Reactions  . Codeine Nausea And Vomiting    Prescriptions Prior to Admission  Medication Sig Dispense Refill Last Dose  . amoxicillin (AMOXIL) 500 MG capsule Take 1 capsule (500 mg total) by mouth 2 (two) times daily. 20 capsule 0   . CALCIUM PO Take 1 tablet by mouth daily.   06/11/2015 at 1000  . ibuprofen (ADVIL,MOTRIN) 600 MG tablet Take 1 tablet (600 mg total) by mouth every 6 (six) hours as needed. (Patient taking differently: Take 600 mg by mouth every 6 (six) hours as needed for headache, mild pain or moderate pain. ) 30 tablet 0 06/11/2015 at 1000  . IRON PO Take 1 tablet by mouth daily.   06/11/2015 at 1000  . labetalol (NORMODYNE) 100 MG tablet Take 1 tablet (100 mg  total) by mouth 2 (two) times daily. 60 tablet 0 06/11/2015 at 1000  . oxyCODONE-acetaminophen (PERCOCET/ROXICET) 5-325 MG per tablet Take 1 tablet by mouth every 6 (six) hours as needed for moderate pain (pain scale 7 or less). 30 tablet 0 Past Week at Unknown time  . Prenatal Vit-Fe Fumarate-FA (PRENATAL MULTIVITAMIN) TABS tablet Take 1 tablet by mouth daily at 12 noon.   06/11/2015 at 1000  . prochlorperazine (COMPAZINE) 10 MG tablet Take 1 tablet (10 mg total) by mouth 2 (two) times daily as needed for nausea or vomiting (headache). 10 tablet 0   . trimethoprim-polymyxin b (POLYTRIM) ophthalmic solution Place 1 drop into both eyes every 6 (six) hours. 10 mL 0     Review of Systems  Constitutional: Negative for fever.  Gastrointestinal: Positive for abdominal pain.       Pain in left side Does not have a BM daily  Genitourinary: Negative for vaginal bleeding and vaginal discharge.   Physical Exam   Blood pressure 125/77, pulse 95, temperature 97.9 F (36.6 C), resp. rate (!) 95, last menstrual period 12/02/2016, unknown if currently breastfeeding.  Physical Exam  Nursing note and vitals reviewed. Constitutional: She is oriented to person, place, and time. She appears well-developed and well-nourished.  HENT:  Head: Normocephalic.  Eyes: EOM are normal.  Neck: Neck supple.  GI: Soft. There is tenderness.  Mild tenderness in left side along waistline, no masses Mild tenderness across lower abdomen No FHT heard with doppler  Genitourinary:  Genitourinary Comments: Speculum exam: Vagina - Small amount of white discharge, no odor Cervix - No contact bleeding Uncomfortable with speculum exam (KY used on plastic speculum) and with swabs on vaginal wall and in cervix. Bimanual exam:   Cervix closed Uterus non tender, 8 week size and slightly to the right side Adnexa non tender, no masses bilaterally GC/Chlam, wet prep done Chaperone present for exam.   Musculoskeletal: Normal range of  motion.  Neurological: She is alert and oriented to person, place, and time.  Skin: Skin is warm and dry.  Psychiatric: She has a normal mood and affect.    MAU Course  Procedures CLINICAL DATA:  Positive pregnancy test with pelvic pain  EXAM: OBSTETRIC <14 WK [place] AND TRANSVAGINAL OB US  TECHNIQUE: Both transabdominal and transvaginal ultrasound examinations were performed for complete evaluation of the gestation as well as the maternal uterus, adnexal regions, and pelvic cul-de-sac. Transvaginal technique was performed to assess early pregnancy.  COMPARISON:  None.  FINDINGS: Intrauterine gestational sac: Present  Yolk sac:  Present  Embryo:  Present  Cardiac Activity: Present  Heart Rate: 120  bpm  CRL:  4.6  mm   6 w   1 d                  [place] EDC: 10/15/2017  Subchorionic hemorrhage:  None visualized.  Maternal uterus/adnexae: Within normal limits. Corpus luteum cyst is noted in the left ovary measuring 11 mm.  IMPRESSION: Single live intrauterine gestation at 6 weeks 1 day.  Results for orders placed or performed during the hospital encounter of 02/20/17 (from the past 24 hour(s))  Urinalysis, Routine w reflex microscopic     Status: Abnormal   Collection Time: 02/20/17  2:22 PM  Result Value Ref Range   Color, Urine YELLOW YELLOW   APPearance HAZY (A) CLEAR   Specific Gravity, Urine 1.018 1.005 - 1.030   pH 6.0 5.0 - 8.0   Glucose, UA NEGATIVE NEGATIVE mg/dL   Hgb urine dipstick NEGATIVE NEGATIVE   Bilirubin Urine NEGATIVE NEGATIVE   Ketones, ur NEGATIVE NEGATIVE mg/dL   Protein, ur NEGATIVE NEGATIVE mg/dL   Nitrite NEGATIVE NEGATIVE   Leukocytes, UA LARGE (A) NEGATIVE   RBC / HPF 6-30 0 - 5 RBC/hpf   WBC, UA 6-30 0 - 5 WBC/hpf   Bacteria, UA RARE (A) NONE SEEN   Squamous Epithelial / LPF 6-30 (A) NONE SEEN   Mucous PRESENT   Pregnancy, urine POC     Status: Abnormal   Collection Time: 02/20/17  2:40 PM  Result Value Ref Range   Preg  Test, Ur POSITIVE (A) NEGATIVE  Wet prep, genital     Status: Abnormal   Collection Time: 02/20/17  2:43 PM  Result Value Ref Range   Yeast Wet Prep HPF POC NONE SEEN NONE SEEN   Trich, Wet Prep NONE SEEN NONE SEEN   Clue Cells Wet Prep HPF POC NONE SEEN NONE SEEN   WBC, Wet Prep HPF POC MANY (A) NONE SEEN   Sperm NONE SEEN   CBC     Status: None   Collection Time:  02/20/17  2:51 PM  Result Value Ref Range   WBC 8.0 4.0 - 10.5 K/uL   RBC 4.47 3.87 - 5.11 MIL/uL   Hemoglobin 13.2 12.0 - 15.0 g/dL   HCT 16.139.6 09.636.0 - 04.546.0 %   MCV 88.6 78.0 - 100.0 fL   MCH 29.5 26.0 - 34.0 pg   MCHC 33.3 30.0 - 36.0 g/dL   RDW 40.912.6 81.111.5 - 91.415.5 %   Platelets 168 150 - 400 K/uL  hCG, quantitative, pregnancy     Status: Abnormal   Collection Time: 02/20/17  2:51 PM  Result Value Ref Range   hCG, Beta Chain, Quant, S 32,969 (H) <5 mIU/mL    MDM Unable to find reason for side pain as it is along her side at the waist level.  She does have a 28 year old child which she does carry on her left hip which may be contributing to the pain in her side.  Client excited over news of pregnancy.  No further questions.  Assessment and Plan  [facility] Abdominal pain in pregnancy - no known cause other than carrying her 366 year old child  Plan No smoking, no drugs, no alcohol.   Take a prenatal vitamin one by mouth every day.   Eat small frequent snacks to avoid nausea.   Begin prenatal care as soon as possible. Pregnancy verification form given. Drink at least 8 8-oz glasses of water every day. Take Tylenol 325 mg 2 tablets by mouth every 4 hours if needed for pain. Avoid carrying your child and see if pain in your side improves.    Jhony Antrim L Janiel Derhammer 02/20/2017, 2:54 PM

## 2017-02-20 NOTE — MAU Note (Addendum)
Pt is having abdominal pain that started a few weeks ago. Now is radiating to her left side. No bleeding. N/V  Loose stool but not watery. Had positive HPT at home, not sure of LMP they are irregular.

## 2017-02-20 NOTE — Discharge Instructions (Signed)
No smoking, no drugs, no alcohol.   Take a prenatal vitamin one by mouth every day.   Eat small frequent snacks to avoid nausea.   Begin prenatal care as soon as possible. Pregnancy verification form given. Drink at least 8 8-oz glasses of water every day. Take Tylenol 325 mg 2 tablets by mouth every 4 hours if needed for pain.

## 2017-02-21 LAB — GC/CHLAMYDIA PROBE AMP (~~LOC~~) NOT AT ARMC
CHLAMYDIA, DNA PROBE: NEGATIVE
Neisseria Gonorrhea: NEGATIVE

## 2017-02-21 LAB — RPR, QUANT+TP ABS (REFLEX)
Rapid Plasma Reagin, Quant: 1:1 {titer} — ABNORMAL HIGH
TREPONEMA PALLIDUM AB: NEGATIVE

## 2017-02-21 LAB — HIV ANTIBODY (ROUTINE TESTING W REFLEX): HIV Screen 4th Generation wRfx: NONREACTIVE

## 2017-02-21 LAB — RPR: RPR Ser Ql: REACTIVE — AB

## 2017-02-24 ENCOUNTER — Telehealth: Payer: Self-pay | Admitting: Certified Nurse Midwife

## 2017-02-24 NOTE — Telephone Encounter (Signed)
Notified pt of reactive RPR. T.Pall negative, titre 1:1, likely false positive. Pt states no prior hx of Syphilis. [facility].

## 2017-05-04 ENCOUNTER — Encounter (HOSPITAL_COMMUNITY): Payer: Self-pay | Admitting: *Deleted

## 2017-05-04 ENCOUNTER — Inpatient Hospital Stay (HOSPITAL_COMMUNITY)
Admission: AD | Admit: 2017-05-04 | Discharge: 2017-05-04 | Disposition: A | Discharge status: Home / Self Care | Payer: Medicaid Other | Source: Ambulatory Visit | Attending: Obstetrics and Gynecology | Admitting: Obstetrics and Gynecology

## 2017-05-04 DIAGNOSIS — E039 Hypothyroidism, unspecified: Secondary | ICD-10-CM | POA: Diagnosis not present

## 2017-05-04 DIAGNOSIS — O99612 Diseases of the digestive system complicating pregnancy, second trimester: Secondary | ICD-10-CM | POA: Diagnosis not present

## 2017-05-04 DIAGNOSIS — K219 Gastro-esophageal reflux disease without esophagitis: Secondary | ICD-10-CM | POA: Diagnosis not present

## 2017-05-04 DIAGNOSIS — O99282 Endocrine, nutritional and metabolic diseases complicating pregnancy, second trimester: Secondary | ICD-10-CM | POA: Diagnosis not present

## 2017-05-04 DIAGNOSIS — Z87891 Personal history of nicotine dependence: Secondary | ICD-10-CM | POA: Insufficient documentation

## 2017-05-04 DIAGNOSIS — O219 Vomiting of pregnancy, unspecified: Secondary | ICD-10-CM | POA: Diagnosis not present

## 2017-05-04 DIAGNOSIS — Z3A16 16 weeks gestation of pregnancy: Secondary | ICD-10-CM | POA: Insufficient documentation

## 2017-05-04 DIAGNOSIS — O210 Mild hyperemesis gravidarum: Secondary | ICD-10-CM | POA: Insufficient documentation

## 2017-05-04 DIAGNOSIS — R51 Headache: Secondary | ICD-10-CM | POA: Diagnosis present

## 2017-05-04 LAB — URINALYSIS, ROUTINE W REFLEX MICROSCOPIC
Glucose, UA: NEGATIVE mg/dL
HGB URINE DIPSTICK: NEGATIVE
Ketones, ur: 80 mg/dL — AB
Nitrite: NEGATIVE
PROTEIN: 100 mg/dL — AB
Specific Gravity, Urine: 1.035 — ABNORMAL HIGH (ref 1.005–1.030)
WBC, UA: NONE SEEN WBC/hpf (ref 0–5)
pH: 7 (ref 5.0–8.0)

## 2017-05-04 MED ORDER — SODIUM CHLORIDE 0.9 % IV BOLUS (SEPSIS)
1000.0000 mL | Freq: Once | INTRAVENOUS | Status: AC
  Administered 2017-05-04: 1000 mL via INTRAVENOUS

## 2017-05-04 MED ORDER — METOCLOPRAMIDE HCL 5 MG/ML IJ SOLN
10.0000 mg | Freq: Once | INTRAMUSCULAR | Status: AC
  Administered 2017-05-04: 10 mg via INTRAVENOUS
  Filled 2017-05-04: qty 2

## 2017-05-04 MED ORDER — PROMETHAZINE HCL 12.5 MG PO TABS: 12.5000 mg | ORAL_TABLET | Freq: Four times a day (QID) | ORAL | 0 refills | Status: DC | PRN

## 2017-05-04 MED ORDER — LACTATED RINGERS IV BOLUS (SEPSIS): 1000.0000 mL | Freq: Once | INTRAVENOUS | Status: DC

## 2017-05-04 MED ORDER — DEXAMETHASONE SODIUM PHOSPHATE 10 MG/ML IJ SOLN
10.0000 mg | Freq: Once | INTRAMUSCULAR | Status: AC
  Administered 2017-05-04: 10 mg via INTRAVENOUS

## 2017-05-04 MED ORDER — DIPHENHYDRAMINE HCL 50 MG/ML IJ SOLN
25.0000 mg | Freq: Once | INTRAMUSCULAR | Status: AC
  Administered 2017-05-04: 25 mg via INTRAVENOUS
  Filled 2017-05-04: qty 1

## 2017-05-04 MED ORDER — RANITIDINE HCL 150 MG PO TABS: 150.0000 mg | ORAL_TABLET | Freq: Two times a day (BID) | ORAL | 1 refills | Status: DC

## 2017-05-04 NOTE — MAU Note (Signed)
Unable to urinate at this time.  

## 2017-05-04 NOTE — Progress Notes (Signed)
Vonzella NippleJulie Wenzel PA in to see pt and review d/c plan. Written and verbal d/c instructions given and understanding voiced

## 2017-05-04 NOTE — MAU Provider Note (Signed)
History     CSN: [id number]  Arrival date and time: 05/04/17 1413   First Provider Initiated Contact with Patient 05/04/17 1503      Chief Complaint  Patient presents with  . Headache  . Emesis   HPI  Christine Beard is a 26 y.o. G2P1001 at 5829w4d who presents to MAU today with complaint of nausea, vomiting and headache since Monday. The patient states that she was taking Diclegis and felt that was helping any morning sickness, but she has not been able to keep that down for the last few days. She denies diarrhea, fever, sick contacts or changes in diet. She has heartburn and takes TUMs with good relief. She denies abdominal pain, vaginal bleeding or complications with the pregnancy. She does have Hypothyroidism and hasn't been able to tolerate her medications the last few days.   OB History    Gravida Para Term Preterm AB Living   2 1 1     1    SAB TAB Ectopic Multiple Live Births         0 1      Past Medical History:  Diagnosis Date  . Anxiety   . Depression   . Headache   . Hypothyroidism   . Thyroid disease     Past Surgical History:  Procedure Laterality Date  . TOE SURGERY    . TONSILLECTOMY      Family History  Problem Relation Age of Onset  . Diabetes Maternal Grandmother   . Diabetes Paternal Grandmother     Social History  Substance Use Topics  . Smoking status: Former Smoker    Quit date: 09/03/2014  . Smokeless tobacco: Never Used  . Alcohol use No    Allergies:  Allergies  Allergen Reactions  . Codeine Nausea And Vomiting    Prescriptions Prior to Admission  Medication Sig Dispense Refill Last Dose  . acetaminophen (TYLENOL) 500 MG tablet Take 500 mg by mouth every 6 (six) hours as needed for headache.   05/04/2017 at Unknown time  . calcium carbonate (TUMS - DOSED IN MG ELEMENTAL CALCIUM) 500 MG chewable tablet Chew 2 tablets by mouth daily as needed for indigestion or heartburn.   05/04/2017 at Unknown time  . Doxylamine-Pyridoxine  (DICLEGIS PO) Take 1-2 tablets by mouth 2 (two) times daily. Pt takes one tablet in the AM and two tablets at night.   05/01/2017  . levothyroxine (SYNTHROID, LEVOTHROID) 50 MCG tablet levothyroxine 50 mcg tablet  take 1 tablet by mouth once daily   05/01/2017  . sertraline (ZOLOFT) 50 MG tablet take 1/2 tablet by mouth once daily for 2 weeks then INCREASE TO 1 TABLET DAILY  0 05/01/2017    Review of Systems  Constitutional: Negative for fever.  Gastrointestinal: Positive for nausea and vomiting. Negative for abdominal pain, constipation and diarrhea.  Genitourinary: Negative for dysuria, frequency, urgency, vaginal bleeding and vaginal discharge.   Physical Exam   Blood pressure (!) 105/58, pulse 94, temperature 98.9 F (37.2 C), temperature source Oral, resp. rate 16, weight 101 lb 8 oz (46 kg), last menstrual period 12/02/2016, SpO2 99 %, unknown if currently breastfeeding.  Physical Exam  Nursing note and vitals reviewed. Constitutional: She is oriented to person, place, and time. She appears well-developed and well-nourished. No distress.  HENT:  Head: Normocephalic and atraumatic.  Cardiovascular: Normal rate.   Respiratory: Effort normal.  GI: Soft. She exhibits no distension. There is no tenderness.  Neurological: She is alert and oriented  to person, place, and time.  Skin: Skin is warm and dry. No erythema.  Psychiatric: She has a normal mood and affect.    Results for orders placed or performed during the hospital encounter of 05/04/17 (from the past 24 hour(s))  Urinalysis, Routine w reflex microscopic     Status: Abnormal   Collection Time: 05/04/17  2:36 PM  Result Value Ref Range   Color, Urine AMBER (A) YELLOW   APPearance HAZY (A) CLEAR   Specific Gravity, Urine 1.035 (H) 1.005 - 1.030   pH 7.0 5.0 - 8.0   Glucose, UA NEGATIVE NEGATIVE mg/dL   Hgb urine dipstick NEGATIVE NEGATIVE   Bilirubin Urine SMALL (A) NEGATIVE   Ketones, ur 80 (A) NEGATIVE mg/dL   Protein,  ur 409100 (A) NEGATIVE mg/dL   Nitrite NEGATIVE NEGATIVE   Leukocytes, UA MODERATE (A) NEGATIVE   RBC / HPF 0-5 0 - 5 RBC/hpf   WBC, UA NONE SEEN 0 - 5 WBC/hpf   Bacteria, UA RARE (A) NONE SEEN   Squamous Epithelial / LPF 6-30 (A) NONE SEEN   Mucous PRESENT    Hyaline Casts, UA PRESENT     MAU Course  Procedures None  MDM FHR - 160 bpm UA today  IV NS with 25 mg Benadryl, 10 mg Decadron and 10 mg Reglan given for headache and nausea  1 liter LR bolus given  Discussed patient with Dr. Ellyn HackBovard. Recommends Rx for Zantac and Phenergan   Assessment and Plan  A: SIUP at 3083w4d Nausea and vomiting in pregnancy prior to [redacted] weeks gestation  GERD   P: Discharge home Rx for Zantac and Phenergan given  Continue Diclegis as prescribed Diet for N/V in pregnancy discussed and included on the AVS Patient [facility] or sooner if symptoms worsen Patient may return to MAU as needed or if her condition were to change or worsen   Vonzella NippleJulie Tara Rud, PA-C 05/04/2017, 3:54 PM

## 2017-05-04 NOTE — MAU Note (Signed)
Hasn't been able to eat or drink since Monday night.   Started out with a HA. No one else at home is sick.  Took some tylenol, did not help

## 2017-05-04 NOTE — Discharge Instructions (Signed)
Morning Sickness °Morning sickness is when you feel sick to your stomach (nauseous) during pregnancy. You may feel sick to your stomach and throw up (vomit). You may feel sick in the morning, but you can feel this way any time of day. Some women feel very sick to their stomach and cannot stop throwing up (hyperemesis gravidarum). °Follow these instructions at home: °· Only take medicines as told by your doctor. °· Take multivitamins as told by your doctor. Taking multivitamins before getting pregnant can stop or lessen the harshness of morning sickness. °· Eat dry toast or unsalted crackers before getting out of bed. °· Eat 5 to 6 small meals a day. °· Eat dry and bland foods like rice and baked potatoes. °· Do not drink liquids with meals. Drink between meals. °· Do not eat greasy, fatty, or spicy foods. °· Have someone cook for you if the smell of food causes you to feel sick or throw up. °· If you feel sick to your stomach after taking prenatal vitamins, take them at night or with a snack. °· Eat protein when you need a snack (nuts, yogurt, cheese). °· Eat unsweetened gelatins for dessert. °· Wear a bracelet used for sea sickness (acupressure wristband). °· Go to a doctor that puts thin needles into certain body points (acupuncture) to improve how you feel. °· Do not smoke. °· Use a humidifier to keep the air in your house free of odors. °· Get lots of fresh air. °Contact a doctor if: °· You need medicine to feel better. °· You feel dizzy or lightheaded. °· You are losing weight. °Get help right away if: °· You feel very sick to your stomach and cannot stop throwing up. °· You pass out (faint). °This information is not intended to replace advice given to you by your health care provider. Make sure you discuss any questions you have with your health care provider. °Document Released: 10/28/2004 Document Revised: 02/26/2016 Document Reviewed: 03/07/2013 °Elsevier Interactive Patient Education © 2017 Elsevier  Inc. °Eating Plan for Hyperemesis Gravidarum °Hyperemesis gravidarum is a severe form of morning sickness. Because this condition causes severe nausea and vomiting, it can lead to dehydration, malnutrition, and weight loss. One way to lessen the symptoms of nausea and vomiting is to follow the eating plan for hyperemesis gravidarum. It is often used along with prescribed medicines to control your symptoms. °What can I do to relieve my symptoms? °Listen to your body. Everyone is different and has different preferences. Find what works best for you. Take any of the following actions that are helpful to you: °· Eat and drink slowly. °· Eat 5-6 small meals daily instead of 3 large meals. °· Eat crackers before you get out of bed in the morning. °· Try having a snack in the middle of the night. °· Starchy foods are usually tolerated well. Examples include cereal, toast, bread, potatoes, pasta, rice, and pretzels. °· Ginger may help with nausea. Add ¼ tsp ground ginger to hot tea or choose ginger tea. °· Try drinking 100% fruit juice or an electrolyte drink. An electrolyte drink contains sodium, potassium, and chloride. °· Continue to take your prenatal vitamins as told by your health care provider. If you are having trouble taking your prenatal vitamins, talk with your health care provider about different options. °· Include at least 1 serving of protein with your meals and snacks. Protein options include meats or poultry, beans, nuts, eggs, and yogurt. Try eating a protein-rich snack before bed. Examples   of these snacks include cheese and crackers or half of a peanut butter or turkey sandwich. °· Consider eliminating foods that trigger your symptoms. These may include spicy foods, coffee, high-fat foods, very sweet foods, and acidic foods. °· Try meals that have more protein combined with bland, salty, lower-fat, and dry foods, such as nuts, seeds, pretzels, crackers, and cereal. °· Talk with your healthcare provider  about starting a supplement of vitamin B6. °· Have fluids that are cold, clear, and carbonated or sour. Examples include lemonade, ginger ale, lemon-lime soda, ice water, and sparkling water. °· Try lemon or mint tea. °· Try brushing your teeth or using a mouth rinse after meals. ° °What should I avoid to reduce my symptoms? °Avoiding some of the following things may help reduce your symptoms. °· Foods with strong smells. Try eating meals in well-ventilated areas that are free of odors. °· Drinking water or other beverages with meals. Try not to drink anything during the 30 minutes before and after your meals. °· Drinking more than 1 cup of fluid at a time. Sometimes using a straw helps. °· Fried or high-fat foods, such as butter and cream sauces. °· Spicy foods. °· Skipping meals as best as you can. Nausea can be more intense on an empty stomach. If you cannot tolerate food at that time, do not force it. Try sucking on ice chips or other frozen items, and make up for missed calories later. °· Lying down within 2 hours after eating. °· Environmental triggers. These may include smoky rooms, closed spaces, rooms with strong smells, warm or humid places, overly loud and noisy rooms, and rooms with motion or flickering lights. °· Quick and sudden changes in your movement. ° °This information is not intended to replace advice given to you by your health care provider. Make sure you discuss any questions you have with your health care provider. °Document Released: 07/18/2007 Document Revised: 05/19/2016 Document Reviewed: 04/20/2016 °Elsevier Interactive Patient Education © 2018 Elsevier Inc. ° °

## 2017-10-04 ENCOUNTER — Encounter (HOSPITAL_COMMUNITY): Payer: Self-pay | Admitting: Obstetrics

## 2017-10-04 ENCOUNTER — Inpatient Hospital Stay (HOSPITAL_COMMUNITY): Payer: Medicaid Other | Admitting: Anesthesiology

## 2017-10-04 ENCOUNTER — Inpatient Hospital Stay (HOSPITAL_COMMUNITY)
Admission: AD | Admit: 2017-10-04 | Discharge: 2017-10-07 | DRG: 798 | Disposition: A | Discharge status: Home / Self Care | Payer: Medicaid Other | Source: Ambulatory Visit | Attending: Obstetrics and Gynecology | Admitting: Obstetrics and Gynecology

## 2017-10-04 DIAGNOSIS — O365930 Maternal care for other known or suspected poor fetal growth, third trimester, not applicable or unspecified: Secondary | ICD-10-CM | POA: Diagnosis present

## 2017-10-04 DIAGNOSIS — Z23 Encounter for immunization: Secondary | ICD-10-CM

## 2017-10-04 DIAGNOSIS — Z3A38 38 weeks gestation of pregnancy: Secondary | ICD-10-CM | POA: Diagnosis not present

## 2017-10-04 DIAGNOSIS — Z9851 Tubal ligation status: Secondary | ICD-10-CM

## 2017-10-04 DIAGNOSIS — O9962 Diseases of the digestive system complicating childbirth: Secondary | ICD-10-CM | POA: Diagnosis present

## 2017-10-04 DIAGNOSIS — O99344 Other mental disorders complicating childbirth: Secondary | ICD-10-CM | POA: Diagnosis present

## 2017-10-04 DIAGNOSIS — Z87891 Personal history of nicotine dependence: Secondary | ICD-10-CM | POA: Diagnosis not present

## 2017-10-04 DIAGNOSIS — Z302 Encounter for sterilization: Secondary | ICD-10-CM | POA: Diagnosis not present

## 2017-10-04 DIAGNOSIS — O26893 Other specified pregnancy related conditions, third trimester: Secondary | ICD-10-CM | POA: Diagnosis present

## 2017-10-04 DIAGNOSIS — E039 Hypothyroidism, unspecified: Secondary | ICD-10-CM | POA: Diagnosis present

## 2017-10-04 DIAGNOSIS — O99284 Endocrine, nutritional and metabolic diseases complicating childbirth: Secondary | ICD-10-CM | POA: Diagnosis present

## 2017-10-04 DIAGNOSIS — Z6791 Unspecified blood type, Rh negative: Secondary | ICD-10-CM | POA: Diagnosis not present

## 2017-10-04 DIAGNOSIS — K219 Gastro-esophageal reflux disease without esophagitis: Secondary | ICD-10-CM | POA: Diagnosis present

## 2017-10-04 DIAGNOSIS — F329 Major depressive disorder, single episode, unspecified: Secondary | ICD-10-CM | POA: Diagnosis present

## 2017-10-04 HISTORY — DX: Tubal ligation status: Z98.51

## 2017-10-04 LAB — CBC
HEMATOCRIT: 37.1 % (ref 36.0–46.0)
Hemoglobin: 12.6 g/dL (ref 12.0–15.0)
MCH: 29.8 pg (ref 26.0–34.0)
MCHC: 34 g/dL (ref 30.0–36.0)
MCV: 87.7 fL (ref 78.0–100.0)
Platelets: 95 10*3/uL — ABNORMAL LOW (ref 150–400)
RBC: 4.23 MIL/uL (ref 3.87–5.11)
RDW: 14.2 % (ref 11.5–15.5)
WBC: 12.9 10*3/uL — AB (ref 4.0–10.5)

## 2017-10-04 LAB — TYPE AND SCREEN
ABO/RH(D): O NEG
Antibody Screen: NEGATIVE

## 2017-10-04 LAB — COMPREHENSIVE METABOLIC PANEL
ALK PHOS: 143 U/L — AB (ref 38–126)
ALT: 10 U/L — ABNORMAL LOW (ref 14–54)
AST: 15 U/L (ref 15–41)
Albumin: 2.6 g/dL — ABNORMAL LOW (ref 3.5–5.0)
Anion gap: 9 (ref 5–15)
BILIRUBIN TOTAL: 0.5 mg/dL (ref 0.3–1.2)
BUN: <5 mg/dL — ABNORMAL LOW (ref 6–20)
CALCIUM: 8.2 mg/dL — AB (ref 8.9–10.3)
CHLORIDE: 107 mmol/L (ref 101–111)
CO2: 20 mmol/L — ABNORMAL LOW (ref 22–32)
CREATININE: 0.46 mg/dL (ref 0.44–1.00)
Glucose, Bld: 88 mg/dL (ref 65–99)
Potassium: 3.5 mmol/L (ref 3.5–5.1)
Sodium: 136 mmol/L (ref 135–145)
TOTAL PROTEIN: 5.7 g/dL — AB (ref 6.5–8.1)

## 2017-10-04 MED ORDER — OXYCODONE-ACETAMINOPHEN 5-325 MG PO TABS: 1.0000 | ORAL_TABLET | ORAL | Status: DC | PRN

## 2017-10-04 MED ORDER — LACTATED RINGERS IV SOLN: 500.0000 mL | Freq: Once | INTRAVENOUS | Status: DC

## 2017-10-04 MED ORDER — LIDOCAINE HCL (PF) 1 % IJ SOLN
INTRAMUSCULAR | Status: DC | PRN
  Administered 2017-10-04: 3 mL via EPIDURAL
  Administered 2017-10-04: 4 mL via EPIDURAL

## 2017-10-04 MED ORDER — EPHEDRINE 5 MG/ML INJ
10.0000 mg | INTRAVENOUS | Status: DC | PRN
  Filled 2017-10-04: qty 2

## 2017-10-04 MED ORDER — ONDANSETRON HCL 4 MG/2ML IJ SOLN
4.0000 mg | Freq: Four times a day (QID) | INTRAMUSCULAR | Status: DC | PRN
  Administered 2017-10-04 (×2): 4 mg via INTRAVENOUS
  Filled 2017-10-04 (×2): qty 2

## 2017-10-04 MED ORDER — OXYTOCIN BOLUS FROM INFUSION
500.0000 mL | Freq: Once | INTRAVENOUS | Status: AC
  Administered 2017-10-05: 500 mL via INTRAVENOUS

## 2017-10-04 MED ORDER — PHENYLEPHRINE 40 MCG/ML (10ML) SYRINGE FOR IV PUSH (FOR BLOOD PRESSURE SUPPORT)
80.0000 ug | PREFILLED_SYRINGE | INTRAVENOUS | Status: DC | PRN
  Filled 2017-10-04: qty 5
  Filled 2017-10-04 (×2): qty 10

## 2017-10-04 MED ORDER — LACTATED RINGERS IV SOLN: 500.0000 mL | INTRAVENOUS | Status: DC | PRN

## 2017-10-04 MED ORDER — LACTATED RINGERS IV SOLN
500.0000 mL | Freq: Once | INTRAVENOUS | Status: AC
  Administered 2017-10-04: 500 mL via INTRAVENOUS

## 2017-10-04 MED ORDER — SOD CITRATE-CITRIC ACID 500-334 MG/5ML PO SOLN: 30.0000 mL | ORAL | Status: DC | PRN

## 2017-10-04 MED ORDER — FLEET ENEMA 7-19 GM/118ML RE ENEM: 1.0000 | ENEMA | RECTAL | Status: DC | PRN

## 2017-10-04 MED ORDER — ACETAMINOPHEN 325 MG PO TABS: 650.0000 mg | ORAL_TABLET | ORAL | Status: DC | PRN

## 2017-10-04 MED ORDER — BUTORPHANOL TARTRATE 1 MG/ML IJ SOLN: 1.0000 mg | INTRAMUSCULAR | Status: DC | PRN

## 2017-10-04 MED ORDER — SODIUM BICARBONATE 8.4 % IV SOLN
INTRAVENOUS | Status: DC | PRN
  Administered 2017-10-04: 3 mL via EPIDURAL
  Administered 2017-10-04: 4 mL via EPIDURAL

## 2017-10-04 MED ORDER — LIDOCAINE HCL (PF) 1 % IJ SOLN
30.0000 mL | INTRAMUSCULAR | Status: DC | PRN
  Filled 2017-10-04: qty 30

## 2017-10-04 MED ORDER — TERBUTALINE SULFATE 1 MG/ML IJ SOLN
0.2500 mg | Freq: Once | INTRAMUSCULAR | Status: DC | PRN
  Filled 2017-10-04: qty 1

## 2017-10-04 MED ORDER — PHENYLEPHRINE 40 MCG/ML (10ML) SYRINGE FOR IV PUSH (FOR BLOOD PRESSURE SUPPORT)
80.0000 ug | PREFILLED_SYRINGE | INTRAVENOUS | Status: DC | PRN
  Filled 2017-10-04: qty 5

## 2017-10-04 MED ORDER — LACTATED RINGERS IV SOLN
INTRAVENOUS | Status: DC
  Administered 2017-10-04 (×2): via INTRAVENOUS

## 2017-10-04 MED ORDER — OXYCODONE-ACETAMINOPHEN 5-325 MG PO TABS: 2.0000 | ORAL_TABLET | ORAL | Status: DC | PRN

## 2017-10-04 MED ORDER — FENTANYL 2.5 MCG/ML BUPIVACAINE 1/10 % EPIDURAL INFUSION (WH - ANES)
14.0000 mL/h | INTRAMUSCULAR | Status: DC | PRN
  Administered 2017-10-04: 10 mL/h via EPIDURAL
  Administered 2017-10-04 (×2): 14 mL/h via EPIDURAL
  Filled 2017-10-04 (×3): qty 100

## 2017-10-04 MED ORDER — DIPHENHYDRAMINE HCL 50 MG/ML IJ SOLN: 12.5000 mg | INTRAMUSCULAR | Status: DC | PRN

## 2017-10-04 MED ORDER — OXYTOCIN 40 UNITS IN LACTATED RINGERS INFUSION - SIMPLE MED
2.5000 [IU]/h | INTRAVENOUS | Status: DC
  Filled 2017-10-04: qty 1000

## 2017-10-04 MED ORDER — OXYTOCIN 40 UNITS IN LACTATED RINGERS INFUSION - SIMPLE MED
1.0000 m[IU]/min | INTRAVENOUS | Status: DC
  Administered 2017-10-04: 2 m[IU]/min via INTRAVENOUS

## 2017-10-04 NOTE — L&D Delivery Note (Signed)
Delivery Note Pt with increased pressure and lip, reduced with several pushes.  Pushed for 10 minutes for delivery.  At 12:39 AM a viable and healthy female was delivered via  (Presentation: OA ; LOT  ).  APGAR: , ; weight P .   Placenta status: delivered, intact .  Cord: #V with the following complications: nuchal x 1 .  Placenta to pathology.  NICU at delivery to assess NICU baby.    Anesthesia:  epidural Episiotomy:  none Lacerations:  Hemostatic perineal abrasion Suture Repair: N/A Est. Blood Loss (mL):  100cc  Mom to postpartum.  Baby to Couplet care / Skin to Skin.  Christine Beard 10/05/2017, 12:58 AM  Br/Oneg/RI/Tdap in PNC/Contra BTL - papers signed 10/18.

## 2017-10-04 NOTE — Anesthesia Procedure Notes (Signed)
Epidural Patient location during procedure: OB Start time: 10/04/2017 10:26 AM  Staffing Anesthesiologist: Mal AmabileFoster, Charlies Rayburn, MD Performed: anesthesiologist   Preanesthetic Checklist Completed: patient identified, site marked, surgical consent, pre-op evaluation, timeout performed, IV checked, risks and benefits discussed and monitors and equipment checked  Epidural Patient position: sitting Prep: site prepped and draped and DuraPrep Patient monitoring: continuous pulse ox and blood pressure Approach: midline Location: L4-L5 Injection technique: LOR air  Needle:  Needle type: Tuohy  Needle gauge: 17 G Needle length: 9 cm and 9 Needle insertion depth: 4 cm Catheter type: closed end flexible Catheter size: 19 Gauge Catheter at skin depth: 9 cm Test dose: negative and Other  Assessment Events: blood not aspirated, injection not painful, no injection resistance, negative IV test and no paresthesia  Additional Notes Patient identified. Risks and benefits discussed including failed block, incomplete  Pain control, post dural puncture headache, nerve damage, paralysis, blood pressure Changes, nausea, vomiting, reactions to medications-both toxic and allergic and post Partum back pain. All questions were answered. Patient expressed understanding and wished to proceed. Sterile technique was used throughout procedure. Epidural site was Dressed with sterile barrier dressing. No paresthesias, signs of intravascular injection Or signs of intrathecal spread were encountered.  Patient was more comfortable after the epidural was dosed. Please see RN's note for documentation of vital signs and FHR which are stable.

## 2017-10-04 NOTE — H&P (Signed)
Christine Beard is a 27 y.o. female G2P1001 at 38+ for IOL given IUGR - followed by dopplers, AFI, and BPP.  She is also varicella nonimmune.  Received Tdap, Rhogam and flu IJ in [facility].  Christine Beard has h/o PreE, plt d/o, depression and hypothyroid.  Also desires BTL, signed papers 07/2017.    OB History    Gravida Para Term Preterm AB Living   2 1 1     1    SAB TAB Ectopic Multiple Live Births         0 1    G1 39wk SVD 5#5 female  G2 present  No abn pap, no STDs  Past Medical History:  Diagnosis Date  . Anxiety   . Depression   . Headache   . Hypothyroidism   . Thyroid disease    Past Surgical History:  Procedure Laterality Date  . TOE SURGERY    . TONSILLECTOMY     Family History: family history includes Diabetes in her maternal grandmother and paternal grandmother.MI, hypercholesterolemia, thy dz, seizure d/o. Social History:  reports that she quit smoking about 3 years ago. she has never used smokeless tobacco. She reports that she does not drink alcohol or use drugs.   Meds Zantac, sertraline, PNV All: Codeine     Maternal Diabetes: No Genetic Screening: Declined Maternal Ultrasounds/Referrals: Normal Fetal Ultrasounds or other Referrals:  None Maternal Substance Abuse:  No Significant Maternal Medications:  Meds include: Other: Sertraline Significant Maternal Lab Results:  Lab values include: Group B Strep negative, Rh negative Other Comments:  None  Review of Systems  Constitutional: Negative.   HENT: Negative.   Eyes: Negative.   Respiratory: Negative.   Cardiovascular: Negative.   Gastrointestinal: Negative.   Genitourinary: Negative.   Musculoskeletal: Positive for back pain.  Skin: Negative.   Neurological: Negative.   Psychiatric/Behavioral: Negative.    Maternal Medical History:  Contractions: Frequency: irregular.    Fetal activity: Perceived fetal activity is normal.    Prenatal complications: IUGR.   Prenatal Complications - Diabetes:  none.      Blood pressure 127/83, pulse (!) 101, temperature 98 F (36.7 C), temperature source Oral, height 4' 7.5" (1.41 m), weight 58.5 kg (129 lb), last menstrual period 12/02/2016, SpO2 99 %, unknown if currently breastfeeding. Maternal Exam:  Uterine Assessment: Contraction frequency is irregular.   Abdomen: Fundal height is S<D.   Estimated fetal weight is EFW 5-6#.   Fetal presentation: vertex  Introitus: Normal vulva. Normal vagina.  Cervix: Cervix evaluated by digital exam.     Physical Exam  Constitutional: She is oriented to person, place, and time. She appears well-developed and well-nourished.  HENT:  Head: Normocephalic and atraumatic.  Cardiovascular: Normal rate and regular rhythm.  Respiratory: Effort normal and breath sounds normal. No respiratory distress. She has no wheezes.  GI: Soft. Bowel sounds are normal. She exhibits no distension. There is no tenderness.  Musculoskeletal: Normal range of motion.  Neurological: She is alert and oriented to person, place, and time.  Skin: Skin is warm and dry.  Psychiatric: She has a normal mood and affect. Her behavior is normal.    Prenatal labs: ABO, Rh:  O neg Antibody:  neg Rubella:  immune RPR: NR HBsAg:   neg HIV: Non Reactive (05/20 1451)  GBS:   neg  Hgb 12.7/Plt 180, 111, 113; Ur Cx neg/GC neg/Chl neg/Varicella nonimmune/TSH WNL  Dated by early [place] Nl ant, ant plac Nl dopplers, AFI, BPP   Assessment/Plan:  26yo  G2P1001 at 38+ with IUGR for IOL AROM and pitocin to augment expect SVD Expect SVD Varicella IJ PP - VNI Follow baby's blood type, poss rhogam   Christine Beard 10/04/2017, 8:26 AM

## 2017-10-04 NOTE — Progress Notes (Signed)
Patient ID: [name], female   DOB: Jan 12, 1991, 27 y.o.   MRN: [id number]   Not totally comfortable with epidural, working with it.    AFVSS gen NAD FHTs 120's, mod var, + accels, category 1 toco Q 4min  SVE 4/70/-1  Continue IOL

## 2017-10-04 NOTE — Anesthesia Preprocedure Evaluation (Addendum)
Anesthesia Evaluation  Patient identified by MRN, date of birth, ID band Patient awake    Reviewed: Allergy & Precautions, Patient's Chart, lab work & pertinent test results  Airway Mallampati: II  TM Distance: >3 FB Neck ROM: Full    Dental no notable dental hx. (+) Teeth Intact   Pulmonary former smoker,    Pulmonary exam normal breath sounds clear to auscultation       Cardiovascular hypertension, negative cardio ROS Normal cardiovascular exam Rhythm:Regular Rate:Normal     Neuro/Psych  Headaches, PSYCHIATRIC DISORDERS Anxiety Depression    GI/Hepatic Neg liver ROS, GERD  Medicated and Controlled,  Endo/Other  Hypothyroidism   Renal/GU negative Renal ROS  negative genitourinary   Musculoskeletal negative musculoskeletal ROS (+)   Abdominal   Peds  Hematology Thrombocytopenia- ? etiology   Anesthesia Other Findings   Reproductive/Obstetrics (+) Pregnancy Desires Sterilization Post partum Hx/o Pre eclampsia post partum with last pregnancy                            Anesthesia Physical Anesthesia Plan  ASA: II  Anesthesia Plan: Epidural   Post-op Pain Management:    Induction:   PONV Risk Score and Plan:   Airway Management Planned:   Additional Equipment:   Intra-op Plan:   Post-operative Plan:   Informed Consent: I have reviewed the patients History and Physical, chart, labs and discussed the procedure including the risks, benefits and alternatives for the proposed anesthesia with the patient or authorized representative who has indicated his/her understanding and acceptance.     Plan Discussed with: Anesthesiologist  Anesthesia Plan Comments:         Anesthesia Quick Evaluation

## 2017-10-04 NOTE — Progress Notes (Signed)
Patient ID: [name], female   DOB: 20-Mar-1991, 27 y.o.   MRN: [id number]  Reviewed H&P, no changes  AFVSS gen NAD FHTs 130's mod var, + accels, category 1 toco q 2-4 min  AROM for small clear fluid  Will augment with pitocin Epidural prn Expect SVD

## 2017-10-04 NOTE — Progress Notes (Signed)
Patient ID: Guy BeginPatricia A Colglazier, female   DOB: 07/16/1991, 27 y.o.   MRN: [id number]  Some pressure with epidural, overall comfortable  AFVSS gen NAD FHTs 120's, mod var, + scalp stim, early decels, category 2 toco q 2-3 min  SVE 8.9cm/90/0  Anticipate SVD soon, cont close monitoring.

## 2017-10-05 ENCOUNTER — Encounter (HOSPITAL_COMMUNITY): Payer: Self-pay | Admitting: Obstetrics and Gynecology

## 2017-10-05 LAB — CBC
HCT: 35.5 % — ABNORMAL LOW (ref 36.0–46.0)
Hemoglobin: 11.8 g/dL — ABNORMAL LOW (ref 12.0–15.0)
MCH: 29.4 pg (ref 26.0–34.0)
MCHC: 33.2 g/dL (ref 30.0–36.0)
MCV: 88.5 fL (ref 78.0–100.0)
PLATELETS: 110 10*3/uL — AB (ref 150–400)
RBC: 4.01 MIL/uL (ref 3.87–5.11)
RDW: 14.1 % (ref 11.5–15.5)
WBC: 22.1 10*3/uL — ABNORMAL HIGH (ref 4.0–10.5)

## 2017-10-05 MED ORDER — OXYCODONE HCL 5 MG PO TABS
10.0000 mg | ORAL_TABLET | ORAL | Status: DC | PRN
  Administered 2017-10-05 – 2017-10-07 (×2): 10 mg via ORAL
  Filled 2017-10-05 (×2): qty 2

## 2017-10-05 MED ORDER — LACTATED RINGERS IV SOLN: INTRAVENOUS | Status: DC

## 2017-10-05 MED ORDER — WITCH HAZEL-GLYCERIN EX PADS: 1.0000 "application " | MEDICATED_PAD | CUTANEOUS | Status: DC | PRN

## 2017-10-05 MED ORDER — ONDANSETRON HCL 4 MG/2ML IJ SOLN
4.0000 mg | INTRAMUSCULAR | Status: DC | PRN
  Administered 2017-10-05 – 2017-10-06 (×2): 4 mg via INTRAVENOUS
  Filled 2017-10-05: qty 2

## 2017-10-05 MED ORDER — IBUPROFEN 600 MG PO TABS
600.0000 mg | ORAL_TABLET | Freq: Four times a day (QID) | ORAL | Status: DC
  Administered 2017-10-05 – 2017-10-07 (×9): 600 mg via ORAL
  Filled 2017-10-05 (×9): qty 1

## 2017-10-05 MED ORDER — SERTRALINE HCL 50 MG PO TABS
50.0000 mg | ORAL_TABLET | Freq: Every day | ORAL | Status: DC
  Administered 2017-10-05: 50 mg via ORAL
  Filled 2017-10-05 (×5): qty 1

## 2017-10-05 MED ORDER — DIPHENHYDRAMINE HCL 25 MG PO CAPS
25.0000 mg | ORAL_CAPSULE | Freq: Four times a day (QID) | ORAL | Status: DC | PRN
Start: 2017-10-05 — End: 2017-10-07

## 2017-10-05 MED ORDER — DIBUCAINE 1 % RE OINT: 1.0000 "application " | TOPICAL_OINTMENT | RECTAL | Status: DC | PRN

## 2017-10-05 MED ORDER — SENNOSIDES-DOCUSATE SODIUM 8.6-50 MG PO TABS
2.0000 | ORAL_TABLET | ORAL | Status: DC
  Administered 2017-10-05 – 2017-10-07 (×2): 2 via ORAL
  Filled 2017-10-05 (×2): qty 2

## 2017-10-05 MED ORDER — SODIUM CHLORIDE 0.9% FLUSH
3.0000 mL | INTRAVENOUS | Status: DC | PRN
  Administered 2017-10-05 – 2017-10-06 (×2): 3 mL via INTRAVENOUS
  Filled 2017-10-05: qty 3

## 2017-10-05 MED ORDER — LACTATED RINGERS IV SOLN
INTRAVENOUS | Status: DC
  Administered 2017-10-06 (×2): via INTRAVENOUS

## 2017-10-05 MED ORDER — PRENATAL MULTIVITAMIN CH
1.0000 | ORAL_TABLET | Freq: Every day | ORAL | Status: DC
  Administered 2017-10-05 – 2017-10-07 (×2): 1 via ORAL
  Filled 2017-10-05 (×3): qty 1

## 2017-10-05 MED ORDER — ONDANSETRON HCL 4 MG PO TABS: 4.0000 mg | ORAL_TABLET | ORAL | Status: DC | PRN

## 2017-10-05 MED ORDER — ACETAMINOPHEN 325 MG PO TABS
650.0000 mg | ORAL_TABLET | ORAL | Status: DC | PRN
  Administered 2017-10-05 – 2017-10-07 (×5): 650 mg via ORAL
  Filled 2017-10-05 (×5): qty 2

## 2017-10-05 MED ORDER — OXYCODONE HCL 5 MG PO TABS
5.0000 mg | ORAL_TABLET | ORAL | Status: DC | PRN
  Administered 2017-10-05 – 2017-10-07 (×4): 5 mg via ORAL
  Filled 2017-10-05 (×4): qty 1

## 2017-10-05 MED ORDER — FAMOTIDINE 20 MG PO TABS
20.0000 mg | ORAL_TABLET | Freq: Every day | ORAL | Status: DC
  Administered 2017-10-05 – 2017-10-07 (×2): 20 mg via ORAL
  Filled 2017-10-05 (×2): qty 1

## 2017-10-05 MED ORDER — COCONUT OIL OIL
1.0000 "application " | TOPICAL_OIL | Status: DC | PRN
  Administered 2017-10-07: 1 via TOPICAL
  Filled 2017-10-05: qty 120

## 2017-10-05 MED ORDER — BENZOCAINE-MENTHOL 20-0.5 % EX AERO
1.0000 "application " | INHALATION_SPRAY | CUTANEOUS | Status: DC | PRN
  Administered 2017-10-06: 1 via TOPICAL
  Filled 2017-10-05: qty 56

## 2017-10-05 MED ORDER — SIMETHICONE 80 MG PO CHEW: 80.0000 mg | CHEWABLE_TABLET | ORAL | Status: DC | PRN

## 2017-10-05 MED ORDER — LEVOTHYROXINE SODIUM 50 MCG PO TABS
50.0000 ug | ORAL_TABLET | Freq: Every day | ORAL | Status: DC
  Administered 2017-10-05 – 2017-10-07 (×3): 50 ug via ORAL
  Filled 2017-10-05 (×4): qty 1

## 2017-10-05 MED ORDER — ZOLPIDEM TARTRATE 5 MG PO TABS: 5.0000 mg | ORAL_TABLET | Freq: Every evening | ORAL | Status: DC | PRN

## 2017-10-05 MED ORDER — CALCIUM CARBONATE ANTACID 500 MG PO CHEW: 2.0000 | CHEWABLE_TABLET | Freq: Every day | ORAL | Status: DC | PRN

## 2017-10-05 NOTE — Lactation Note (Addendum)
This note was copied from a baby's chart. Lactation Consultation Note Baby 4 hrs old. Wt. 5.3 lbs. Baby not interested in BF. Sleepy. Mom attempted to BF, mom stated baby would suck a few times then fall off, unable to maintain latch.  Mom tried to give formula, not interested in sucking on bottle. Gags. Spoon fed a few drops of colostrum LC hand expressed. Baby had watery emesis. LC changes a large watery mec. Diaper. Abd. appears to be slightly distended. Discussed newborn behavior, feeding habits, babies less than 6 lbs, importance of I&O, STS, cluster feeding, supply and demand.  Mom encouraged to feed baby 8-12 times/24 hours and with feeding cues. Mom encouraged to waken baby for feeds if hasn't cued to feed in 3 hrs. Alert staff if not eating or to sleepy. Mom shown how to use DEBP & how to disassemble, clean, & reassemble parts. Mom knows to pump q3h for 15-20 min.  Mom has small breast, but has breast tissue w/easy flow of rich colostrum. Mom stated she has been leaking for weeks.  Mom has small everted nipples, Lt. Nipple compresses inwards. Discussed w/mom not to compress Lt. Breast, just latch baby onto nipple to allow nipple to pull into mouth deep. Shells given to wear today. Hand pump given and demonstrated to evert nipples prior to latching. #21 flanges applied to hand pump and DEBP. [name] sleep, mom stated she was so sleepy, baby spiting up clear water emesis. LC suctioned w/bulb syring. LC advised mom if she was going to sleep, baby needed to be supervised, baby can be supervised in CN while mom sleep, when she wakens to call for baby. Mom agreed. Arm bands verified between mom and baby.  Report given to Best BuyHeather RN in nursery.  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 Mom would like to BF this baby.  While trying to get baby to suck w/gloved finger, noted slight high palate. Didn't assess tongue, baby to gaggy.  [facility]/LC brochure given  w/resources, support groups and LC services.  Patient Name: Christine Beard Christine Beard: 10/05/2017 Reason for consult: Initial assessment;Infant < 6lbs;Early term 37-38.6wks   Maternal Data Has patient been taught Hand Expression?: Yes Does the patient have breastfeeding experience prior to this delivery?: Yes  Feeding Feeding Type: Breast Milk  LATCH Score Latch: Too sleepy or reluctant, no latch achieved, no sucking elicited.  Audible Swallowing: None  Type of Nipple: Everted at rest and after stimulation(Lt. everted nipple compresses inwards)  Comfort (Breast/Nipple): Soft / non-tender        Interventions Interventions: Breast compression;Hand pump;DEBP;Skin to skin;Support pillows;Breast massage;Hand express;Expressed milk;Shells;Pre-pump if needed;Breast feeding basics reviewed  Lactation Tools Discussed/Used Tools: Shells;Pump;Flanges Flange Size: 21 Shell Type: Inverted Breast pump type: Double-Electric Breast Pump;Manual WIC Program: No Pump Review: Milk Storage;Setup, frequency, and cleaning Initiated by:: Peri JeffersonL. Mehki Klumpp RN IBCLC Beard initiated:: 10/05/17   Consult Status Consult Status: Follow-up Beard: 10/05/17 Follow-up type: In-patient    Charyl DancerCARVER, Abagayle Klutts G 10/05/2017, 5:38 AM

## 2017-10-05 NOTE — Anesthesia Postprocedure Evaluation (Signed)
Anesthesia Post Note  Patient: Christine Beard  Procedure(s) Performed: AN AD HOC LABOR EPIDURAL     Patient location during evaluation: Mother Baby Anesthesia Type: Epidural Level of consciousness: awake Pain management: satisfactory to patient Vital Signs Assessment: post-procedure vital signs reviewed and stable Respiratory status: spontaneous breathing Cardiovascular status: stable Anesthetic complications: no    Last Vitals:  Vitals:   10/05/17 0330 10/05/17 0800  BP: 109/65 (!) 107/53  Pulse: 98 84  Resp:  18  Temp: 36.8 C 36.8 C  SpO2:      Last Pain:  Vitals:   10/05/17 0800  TempSrc: Oral  PainSc: 4    Pain Goal:                 KeyCorpBURGER,Rionna Feltes

## 2017-10-05 NOTE — Plan of Care (Signed)
Emotional support given to pt as baby was transferred to NICU this afternoon and pt has been tearful.

## 2017-10-05 NOTE — Progress Notes (Signed)
Patient ID: [name], female   DOB: [birth year], 27 y.o.   MRN: [id number]   Will potentially add to schedule Thursday am for BTL.  D/w pt r/b/a.  Will make NPO after MN on Wednesday.

## 2017-10-05 NOTE — Progress Notes (Signed)
Post Partum Day DOD Subjective: no complaints except fatigue.  Had N/V earlier, but resolved now  Objective: Blood pressure (!) 107/53, pulse 84, temperature 98.3 F (36.8 C), temperature source Oral, resp. rate 18, height 4' 7.5" (1.41 m), weight 58.5 kg (129 lb), last menstrual period 12/02/2016, SpO2 96 %, unknown if currently breastfeeding.  Physical Exam:  General: alert and cooperative Lochia: appropriate Uterine Fundus: firm   Recent Labs    10/04/17 0810 10/05/17 0542  HGB 12.6 11.8*  HCT 37.1 35.5*    Assessment/Plan: Tubal scheduled for tomorrow AM with Dr. Ellyn HackBovard    LOS: 1 day   Oliver PilaKathy W Wendy Mikles 10/05/2017, 10:07 AM

## 2017-10-05 NOTE — Anesthesia Preprocedure Evaluation (Addendum)
Anesthesia Evaluation  Patient identified by MRN, date of birth, ID band Patient awake    Reviewed: Allergy & Precautions, NPO status , Patient's Chart, lab work & pertinent test results  Airway Mallampati: I  TM Distance: >3 FB Neck ROM: Full    Dental no notable dental hx. (+) Teeth Intact   Pulmonary former smoker,    Pulmonary exam normal breath sounds clear to auscultation       Cardiovascular hypertension, negative cardio ROS Normal cardiovascular exam Rhythm:Regular Rate:Normal     Neuro/Psych  Headaches, PSYCHIATRIC DISORDERS Anxiety Depression    GI/Hepatic Neg liver ROS, GERD  Medicated and Controlled,  Endo/Other  Hypothyroidism   Renal/GU negative Renal ROS  negative genitourinary   Musculoskeletal negative musculoskeletal ROS (+)   Abdominal   Peds  Hematology Thrombocytopenia- ? etiology   Anesthesia Other Findings   Reproductive/Obstetrics (+) Pregnancy Desires Sterilization Post partum Hx/o Pre eclampsia post partum with last pregnancy                            Anesthesia Physical  Anesthesia Plan  ASA: II  Anesthesia Plan: Epidural   Post-op Pain Management:    Induction:   PONV Risk Score and Plan: 2 and Ondansetron, Treatment may vary due to age or medical condition and Midazolam  Airway Management Planned: Nasal Cannula and Natural Airway  Additional Equipment:   Intra-op Plan:   Post-operative Plan:   Informed Consent: I have reviewed the patients History and Physical, chart, labs and discussed the procedure including the risks, benefits and alternatives for the proposed anesthesia with the patient or authorized representative who has indicated his/her understanding and acceptance.     Plan Discussed with: Anesthesiologist and CRNA  Anesthesia Plan Comments:         Anesthesia Quick Evaluation

## 2017-10-05 NOTE — Progress Notes (Signed)

## 2017-10-06 ENCOUNTER — Inpatient Hospital Stay (HOSPITAL_COMMUNITY): Payer: Medicaid Other | Admitting: Anesthesiology

## 2017-10-06 ENCOUNTER — Encounter (HOSPITAL_COMMUNITY): Payer: Self-pay | Admitting: Anesthesiology

## 2017-10-06 ENCOUNTER — Encounter (HOSPITAL_COMMUNITY)
Admission: AD | Disposition: A | Discharge status: Home / Self Care | Payer: Self-pay | Source: Ambulatory Visit | Attending: Obstetrics and Gynecology

## 2017-10-06 ENCOUNTER — Other Ambulatory Visit: Payer: Self-pay

## 2017-10-06 DIAGNOSIS — Z9851 Tubal ligation status: Secondary | ICD-10-CM

## 2017-10-06 HISTORY — DX: Tubal ligation status: Z98.51

## 2017-10-06 HISTORY — PX: TUBAL LIGATION: SHX77

## 2017-10-06 LAB — RPR: RPR Ser Ql: NONREACTIVE

## 2017-10-06 LAB — CBC
HEMATOCRIT: 30.7 % — AB (ref 36.0–46.0)
HEMOGLOBIN: 10.1 g/dL — AB (ref 12.0–15.0)
MCH: 29.8 pg (ref 26.0–34.0)
MCHC: 32.9 g/dL (ref 30.0–36.0)
MCV: 90.6 fL (ref 78.0–100.0)
Platelets: 94 10*3/uL — ABNORMAL LOW (ref 150–400)
RBC: 3.39 MIL/uL — ABNORMAL LOW (ref 3.87–5.11)
RDW: 14.4 % (ref 11.5–15.5)
WBC: 13.9 10*3/uL — ABNORMAL HIGH (ref 4.0–10.5)

## 2017-10-06 SURGERY — LIGATION, FALLOPIAN TUBE, POSTPARTUM
Anesthesia: Epidural

## 2017-10-06 MED ORDER — LIDOCAINE HCL (CARDIAC) 20 MG/ML IV SOLN
INTRAVENOUS | Status: AC
  Filled 2017-10-06: qty 5

## 2017-10-06 MED ORDER — FENTANYL CITRATE (PF) 100 MCG/2ML IJ SOLN
INTRAMUSCULAR | Status: DC | PRN
  Administered 2017-10-06 (×2): 50 ug via INTRAVENOUS

## 2017-10-06 MED ORDER — SODIUM BICARBONATE 8.4 % IV SOLN
INTRAVENOUS | Status: DC | PRN
  Administered 2017-10-06 (×4): 5 mL via EPIDURAL

## 2017-10-06 MED ORDER — OXYCODONE-ACETAMINOPHEN 5-325 MG PO TABS: 1.0000 | ORAL_TABLET | ORAL | Status: DC | PRN

## 2017-10-06 MED ORDER — MIDAZOLAM HCL 2 MG/2ML IJ SOLN
INTRAMUSCULAR | Status: DC | PRN
  Administered 2017-10-06 (×2): 1 mg via INTRAVENOUS

## 2017-10-06 MED ORDER — FENTANYL CITRATE (PF) 100 MCG/2ML IJ SOLN: 25.0000 ug | INTRAMUSCULAR | Status: DC | PRN

## 2017-10-06 MED ORDER — FENTANYL CITRATE (PF) 100 MCG/2ML IJ SOLN
INTRAMUSCULAR | Status: AC
  Filled 2017-10-06: qty 2

## 2017-10-06 MED ORDER — PNEUMOCOCCAL VAC POLYVALENT 25 MCG/0.5ML IJ INJ
0.5000 mL | INJECTION | Freq: Once | INTRAMUSCULAR | Status: AC
  Administered 2017-10-07: 0.5 mL via INTRAMUSCULAR
  Filled 2017-10-06: qty 0.5

## 2017-10-06 MED ORDER — MEPERIDINE HCL 25 MG/ML IJ SOLN: 6.2500 mg | INTRAMUSCULAR | Status: DC | PRN

## 2017-10-06 MED ORDER — ONDANSETRON HCL 4 MG/2ML IJ SOLN: 4.0000 mg | Freq: Once | INTRAMUSCULAR | Status: DC | PRN

## 2017-10-06 MED ORDER — BUPIVACAINE HCL (PF) 0.25 % IJ SOLN
INTRAMUSCULAR | Status: AC
  Filled 2017-10-06: qty 30

## 2017-10-06 MED ORDER — FAMOTIDINE 20 MG PO TABS
40.0000 mg | ORAL_TABLET | Freq: Once | ORAL | Status: AC
  Administered 2017-10-06: 40 mg via ORAL
  Filled 2017-10-06: qty 2

## 2017-10-06 MED ORDER — ACETAMINOPHEN 325 MG PO TABS: 325.0000 mg | ORAL_TABLET | ORAL | Status: DC | PRN

## 2017-10-06 MED ORDER — FENTANYL CITRATE (PF) 100 MCG/2ML IJ SOLN
25.0000 ug | Freq: Once | INTRAMUSCULAR | Status: AC
  Administered 2017-10-06: 25 ug via INTRAVENOUS
  Filled 2017-10-06: qty 2

## 2017-10-06 MED ORDER — LACTATED RINGERS IV SOLN
INTRAVENOUS | Status: DC
  Administered 2017-10-06: 08:00:00 via INTRAVENOUS

## 2017-10-06 MED ORDER — OXYCODONE HCL 5 MG/5ML PO SOLN: 5.0000 mg | Freq: Once | ORAL | Status: DC | PRN

## 2017-10-06 MED ORDER — ACETAMINOPHEN 160 MG/5ML PO SOLN: 325.0000 mg | ORAL | Status: DC | PRN

## 2017-10-06 MED ORDER — KETOROLAC TROMETHAMINE 30 MG/ML IJ SOLN: 30.0000 mg | Freq: Once | INTRAMUSCULAR | Status: DC | PRN

## 2017-10-06 MED ORDER — BUPIVACAINE HCL (PF) 0.25 % IJ SOLN
INTRAMUSCULAR | Status: DC | PRN
  Administered 2017-10-06: 20 mL

## 2017-10-06 MED ORDER — MIDAZOLAM HCL 2 MG/2ML IJ SOLN
INTRAMUSCULAR | Status: AC
  Filled 2017-10-06: qty 2

## 2017-10-06 MED ORDER — OXYCODONE HCL 5 MG PO TABS: 5.0000 mg | ORAL_TABLET | Freq: Once | ORAL | Status: DC | PRN

## 2017-10-06 MED ORDER — METOCLOPRAMIDE HCL 10 MG PO TABS
10.0000 mg | ORAL_TABLET | Freq: Once | ORAL | Status: AC
  Administered 2017-10-06: 10 mg via ORAL
  Filled 2017-10-06: qty 1

## 2017-10-06 MED ORDER — ONDANSETRON HCL 4 MG/2ML IJ SOLN
INTRAMUSCULAR | Status: AC
  Filled 2017-10-06: qty 2

## 2017-10-06 MED ORDER — PROPOFOL 10 MG/ML IV BOLUS
INTRAVENOUS | Status: AC
  Filled 2017-10-06: qty 20

## 2017-10-06 SURGICAL SUPPLY — 30 items
APL SKNCLS STERI-STRIP NONHPOA (GAUZE/BANDAGES/DRESSINGS) ×1
BENZOIN TINCTURE PRP APPL 2/3 (GAUZE/BANDAGES/DRESSINGS) ×3 IMPLANT
BLADE SURG 11 STRL SS (BLADE) ×3 IMPLANT
CLOSURE STERI STRIP 1/2 X4 (GAUZE/BANDAGES/DRESSINGS) ×3 IMPLANT
CLOTH BEACON ORANGE TIMEOUT ST (SAFETY) ×3 IMPLANT
CONTAINER PREFILL 10% NBF 15ML (MISCELLANEOUS) ×6 IMPLANT
DRSG OPSITE 4X5.5 SM (GAUZE/BANDAGES/DRESSINGS) ×3 IMPLANT
DRSG OPSITE POSTOP 3X4 (GAUZE/BANDAGES/DRESSINGS) ×3 IMPLANT
DURAPREP 26ML APPLICATOR (WOUND CARE) ×3 IMPLANT
ELECT REM PT RETURN 9FT ADLT (ELECTROSURGICAL) ×6
ELECTRODE REM PT RTRN 9FT ADLT (ELECTROSURGICAL) ×2 IMPLANT
GLOVE BIO SURGEON STRL SZ 6.5 (GLOVE) ×4 IMPLANT
GLOVE BIO SURGEONS STRL SZ 6.5 (GLOVE) ×2
GLOVE BIOGEL PI IND STRL 7.0 (GLOVE) ×1 IMPLANT
GLOVE BIOGEL PI INDICATOR 7.0 (GLOVE) ×2
GOWN STRL REUS W/TWL LRG LVL3 (GOWN DISPOSABLE) ×6 IMPLANT
NEEDLE HYPO 22GX1.5 SAFETY (NEEDLE) IMPLANT
NS IRRIG 1000ML POUR BTL (IV SOLUTION) ×3 IMPLANT
PACK ABDOMINAL MINOR (CUSTOM PROCEDURE TRAY) ×3 IMPLANT
PENCIL BUTTON HOLSTER BLD 10FT (ELECTRODE) IMPLANT
PROTECTOR NERVE ULNAR (MISCELLANEOUS) ×3 IMPLANT
SPONGE LAP 4X18 X RAY DECT (DISPOSABLE) ×3 IMPLANT
STRIP CLOSURE SKIN 1/2X4 (GAUZE/BANDAGES/DRESSINGS) ×2 IMPLANT
SUT PLAIN 0 NONE (SUTURE) ×9 IMPLANT
SUT VIC AB 0 CT1 27 (SUTURE) ×2
SUT VIC AB 0 CT1 27XBRD ANBCTR (SUTURE) ×1 IMPLANT
SUT VIC AB 3-0 FS2 27 (SUTURE) ×3 IMPLANT
SYR CONTROL 10ML LL (SYRINGE) IMPLANT
TOWEL OR 17X24 6PK STRL BLUE (TOWEL DISPOSABLE) ×6 IMPLANT
TRAY FOLEY CATH SILVER 14FR (SET/KITS/TRAYS/PACK) ×3 IMPLANT

## 2017-10-06 NOTE — Progress Notes (Signed)
CSW attempted to meet with [name] to offer support and complete assessment due to [place] Post[place] Depression [place] score [place] 19 but she was in the OR for BTL.  CSW will attempt again later today if possible or meet with [name] when she comes to visit baby in NICU.

## 2017-10-06 NOTE — Progress Notes (Signed)
Post Partum Day 1 Subjective: no complaints, up ad lib, voiding, tolerating PO and nl lochia, pain controlled.  Baby transferred to NICU.    Objective: Blood pressure (!) 109/59, pulse 85, temperature 98.3 F (36.8 C), temperature source Oral, resp. rate 18, height 4' 7.5" (1.41 m), weight 58.5 kg (129 lb), last menstrual period 12/02/2016, SpO2 100 %, unknown if currently breastfeeding.  Physical Exam:  General: alert and no distress Lochia: appropriate Uterine Fundus: firm  Recent Labs    10/04/17 0810 10/05/17 0542  HGB 12.6 11.8*  HCT 37.1 35.5*    Assessment/Plan: Plan for discharge tomorrow.  Plan for PP BTL today - d/w pt r/b/a.  Baby in NICU - trouble with feeding.     LOS: 2 days   Gerry Heaphy Bovard-Stuckert 10/06/2017, 7:29 AM

## 2017-10-06 NOTE — Brief Op Note (Signed)
10/06/2017  10:30 AM  PATIENT:  Christine BeginPatricia A Beard  27 y.o. female  PRE-OPERATIVE DIAGNOSIS:  PP BTL  POST-OPERATIVE DIAGNOSIS:  PP BTL  PROCEDURE:  Procedure(s): POST PARTUM TUBAL LIGATION (N/A)  SURGEON:  Surgeon(s) and Role:    * Bovard-Stuckert, Jacqueline Spofford, MD - Primary  ANESTHESIA:   local, epidural and nitrous oxide  EBL:  25 mL   BLOOD ADMINISTERED:none  DRAINS: Urinary Catheter (Foley) - d/c in PACU   LOCAL MEDICATIONS USED:  MARCAINE     SPECIMEN:  Source of Specimen:  B tubal segments  DISPOSITION OF SPECIMEN:  PATHOLOGY  COUNTS:  YES  TOURNIQUET:  * No tourniquets in log *  DICTATION: .Other Dictation: Dictation Number [id number]  PLAN OF CARE: return to postpartum  PATIENT DISPOSITION:  PACU - hemodynamically stable.   Delay start of Pharmacological VTE agent (>24hrs) due to surgical blood loss or risk of bleeding: not applicable

## 2017-10-06 NOTE — Progress Notes (Signed)
Spoke with Dr. Ellyn HackBovard regarding patient's plts of 94 and giving motrin throughout the night. Dr. Ellyn HackBovard states it is okay to continue to give motrin.

## 2017-10-06 NOTE — Anesthesia Postprocedure Evaluation (Addendum)
Anesthesia Post Note  Patient: Christine Beard  Procedure(s) Performed: POST PARTUM TUBAL LIGATION (N/A )     Patient location during evaluation: Mother Baby Anesthesia Type: Epidural Level of consciousness: awake and alert Pain management: pain level controlled Vital Signs Assessment: post-procedure vital signs reviewed and stable Respiratory status: spontaneous breathing, nonlabored ventilation and respiratory function stable Cardiovascular status: stable Postop Assessment: no headache, no backache, epidural receding and patient able to bend at knees Anesthetic complications: no    Last Vitals:  Vitals:   10/06/17 1201 10/06/17 1300  BP: (!) 123/55 (!) 115/59  Pulse: 83 79  Resp: 17 16  Temp: 36.9 C 37 C  SpO2: 99% 99%    Pain Goal: Patients Stated Pain Goal: 2 (10/06/17 1300)               Shelton SilvasKevin D Brylin Stopper

## 2017-10-06 NOTE — Transfer of Care (Signed)
Immediate Anesthesia Transfer of Care Note  Patient: Christine Beard  Procedure(s) Performed: POST PARTUM TUBAL LIGATION (N/A )  Patient Location: PACU  Anesthesia Type:Spinal  Level of Consciousness: sedated  Airway & Oxygen Therapy: Patient Spontanous Breathing  Post-op Assessment: Report given to RN  Post vital signs: Reviewed and stable  Last Vitals:  Vitals:   10/06/17 0618 10/06/17 0807  BP: (!) 109/59 (!) 102/56  Pulse: 85 91  Resp: 18 16  Temp: 36.8 C 36.4 C  SpO2:  100%    Last Pain:  Vitals:   10/06/17 0807  TempSrc: Oral  PainSc: 4       Patients Stated Pain Goal: 2 (10/06/17 0807)  Complications: No apparent anesthesia complications

## 2017-10-07 ENCOUNTER — Encounter (HOSPITAL_COMMUNITY): Payer: Self-pay | Admitting: Obstetrics and Gynecology

## 2017-10-07 MED ORDER — IBUPROFEN 600 MG PO TABS: 600.0000 mg | ORAL_TABLET | Freq: Four times a day (QID) | ORAL | 0 refills | Status: DC

## 2017-10-07 MED ORDER — OXYCODONE HCL 5 MG PO TABS: 5.0000 mg | ORAL_TABLET | ORAL | 0 refills | Status: DC | PRN

## 2017-10-07 NOTE — Discharge Summary (Signed)
OB Discharge Summary     Patient Name: Christine Beard DOB: 1991-05-24 MRN: [id number]  Date of admission: 10/04/2017 Delivering MD: Sherian ReinBOVARD-STUCKERT, JODY   Date of discharge: 10/07/2017  Admitting diagnosis: INDUCTION Intrauterine pregnancy: 1845w4d     Secondary diagnosis:  Principal Problem:   SVD (spontaneous vaginal delivery) Active Problems:   Labor and delivery indication for care or intervention   S/P tubal ligation      Discharge diagnosis: Term Pregnancy Delivered and desires permanent sterility                                                                                                Post partum procedures:postpartum tubal ligation  Hospital course:  Induction of Labor With Vaginal Delivery   27 y.o. yo [id number] at 5145w4d was admitted to the hospital 10/04/2017 for induction of labor.  Indication for induction: suspected IUGR.  Patient had an uncomplicated labor course as follows: Membrane Rupture Time/Date: 9:39 AM ,10/04/2017   Intrapartum Procedures: Episiotomy: None [1]                                         Lacerations:  1st degree [2];Perineal [11]  Patient had delivery of a Viable infant.  Information for the patient's newborn:  Sharol Givenearson, Girl [name] [[id number]][[id number]]  Delivery Method: Vaginal, Spontaneous(Filed from Delivery Summary)   10/05/2017  Details of delivery can be found in separate delivery note.  Patient had a routine postpartum course with PPBTL on PPD #1. Patient is discharged home 10/07/17.  Physical exam  Vitals:   10/06/17 1201 10/06/17 1300 10/06/17 1750 10/07/17 0550  BP: (!) 123/55 (!) 115/59 135/69 112/61  Pulse: 83 79 81 80  Resp: 17 16 16 18   Temp: 98.4 F (36.9 C) 98.6 F (37 C) 97.7 F (36.5 C) 98.4 F (36.9 C)  TempSrc: Oral Oral Oral Oral  SpO2: 99% 99% 99%   Weight:      Height:       General: alert Lochia: appropriate Uterine Fundus: firm Incision: Healing well with no significant drainage  Labs: Lab Results  Component  Value Date   WBC 13.9 (H) 10/06/2017   HGB 10.1 (L) 10/06/2017   HCT 30.7 (L) 10/06/2017   MCV 90.6 10/06/2017   PLT 94 (L) 10/06/2017   CMP Latest Ref Rng & Units 10/04/2017  Glucose 65 - 99 mg/dL 88  BUN 6 - 20 mg/dL <1(H<5(L)  Creatinine 0.860.44 - 1.00 mg/dL 5.780.46  Sodium 469135 - 629145 mmol/L 136  Potassium 3.5 - 5.1 mmol/L 3.5  Chloride 101 - 111 mmol/L 107  CO2 22 - 32 mmol/L 20(L)  Calcium 8.9 - 10.3 mg/dL 8.2(L)  Total Protein 6.5 - 8.1 g/dL 5.2(W5.7(L)  Total Bilirubin 0.3 - 1.2 mg/dL 0.5  Alkaline Phos 38 - 126 U/L 143(H)  AST 15 - 41 U/L 15  ALT 14 - 54 U/L 10(L)    Discharge instruction: per After Visit Summary and "Baby and Me Booklet".  After visit meds:  Allergies  as of 10/07/2017      Reactions   Codeine Nausea And Vomiting      Medication List    STOP taking these medications   DICLEGIS PO     TAKE these medications   acetaminophen 500 MG tablet Commonly known as:  TYLENOL Take 1,000 mg by mouth every 6 (six) hours as needed for mild pain or headache.   calcium carbonate 500 MG chewable tablet Commonly known as:  TUMS - dosed in mg elemental calcium Chew 2 tablets by mouth daily as needed for indigestion or heartburn.   ibuprofen 600 MG tablet Commonly known as:  ADVIL,MOTRIN Take 1 tablet (600 mg total) by mouth every 6 (six) hours.   levothyroxine 50 MCG tablet Commonly known as:  SYNTHROID, LEVOTHROID levothyroxine 50 mcg tablet  take 1 tablet by mouth once daily   oxyCODONE 5 MG immediate release tablet Commonly known as:  Oxy IR/ROXICODONE Take 1 tablet (5 mg total) by mouth every 4 (four) hours as needed for severe pain.   prenatal multivitamin Tabs tablet Take 1 tablet by mouth daily at 12 noon.   promethazine 12.5 MG tablet Commonly known as:  PHENERGAN Take 1 tablet (12.5 mg total) by mouth every 6 (six) hours as needed for nausea or vomiting.   ranitidine 150 MG tablet Commonly known as:  ZANTAC Take 1 tablet (150 mg total) by mouth 2 (two)  times daily.   sertraline 50 MG tablet Commonly known as:  ZOLOFT Take 50 mg by mouth daily.       Diet: routine diet  Activity: Advance as tolerated. Pelvic rest for 6 weeks.   Outpatient follow up:2 weeks   Newborn Data: Live born female  Birth Weight: 5 lb 3 oz (2353 g) APGAR: 8, 8  Newborn Delivery   Birth date/time:  10/05/2017 00:39:00 Delivery type:  Vaginal, Spontaneous     Baby Feeding: Breast Disposition:NICU   10/07/2017 Zenaida Niece, MD

## 2017-10-07 NOTE — Addendum Note (Signed)
Addendum  created 10/07/17 1331 by Earmon PhoenixWilkerson, Vianey Caniglia P, CRNA   Sign clinical note

## 2017-10-07 NOTE — Clinical Social Work Maternal (Signed)
CLINICAL SOCIAL WORK MATERNAL/CHILD NOTE  Patient Details  Name: Christine Beard MRN: [id number] Date of Birth: 03/12/1991  Date:  10/07/2017  Clinical Social Worker Initiating Note:  Cailee Blanke, LCSW Date/Time: Initiated:  10/07/17/1200     Child's Name:  Christine Beard   Biological Parents:  Mother, Father(Christine Beard and Christine Beard)   Need for Interpreter:  None   Reason for Referral:  Behavioral Health Concerns   Address:  [address]  [place]  [address]    Phone number:  [phone] (home)     Additional phone number:   Household Members/Support Persons (HM/SP):   Household Member/Support Person 1, Household Member/Support Person 2   HM/SP Name Relationship DOB or Age  HM/SP -1 Christine Beard [name]/Fiance    HM/SP -2 Christine Beard daughter 2  HM/SP -3        HM/SP -4        HM/SP -5        HM/SP -6        HM/SP -7        HM/SP -8          Natural Supports (not living in the home):  Parent, Extended Family([name] reports that her father and [name]'s parents are her greatest support people.)   Professional Supports: None([name] is interested in returning to therapy.)   Employment:     Type of Work: [name] is a lead operator at [employer]   Education:      Homebound arranged:    Financial Resources:  Medicaid   Other Resources:      Cultural/Religious Considerations Which May Impact Care: None stated.  [name]'s facesheet notes religion as Baptist.   Strengths:  Home prepared for child , Ability to meet basic needs , Psychotropic Medications, Pediatrician chosen, Understanding of illness, Compliance with medical plan    Psychotropic Medications:  Zoloft      Pediatrician:    [place] area  Pediatrician List:   [place]    [place] [facility]   County    [place]    [place]    [place]      Pediatrician Fax Number:    Risk Factors/Current Problems:  Mental Health Concerns     Cognitive State:  Able to Concentrate , Alert , Thought Blocking , Poor Insight    Mood/Affect:  Sad , Depressed , Calm , Interested    CSW Assessment: CSW met with [name] and [name] in [name]'s first floor room to offer support and complete assessment due to hx of Depression, NICU admission, and Edinburgh Postnatal Depression Screen score of 19.   Parents were receptive of the visit and both participated in the conversation.  [name] presented with depressed mood and states she "flipped out" when baby was taken to the NICU.  She reports she is not coping well with the situation.  CSW provided supportive brief counseling and spent nearly an hour with parents in order to help them, mainly [name], process their feelings regarding baby's admission to NICU and extended hospitalization.  At times [name] was resistant to re-framing her thinking and remained very negative about the situation, however, in the end, [name] concluded that she understands why baby is in the NICU and was able to identify progress that baby has made since birth.  [name] states he has had experience with friends and family having babies in the NICU and states he knows she is in good hands and feels he is coping well.    Parents seem supportive towards each other.  They have no questions specific to the NICU setting.   [name] reports that she has a hx of anxiety and depression and was not able to identify any positive coping mechanisms other than "sit in my room and cry."  While CSW notes that crying can be healthy to a certain degree, CSW encouraged [name] to monitor her level of emotion and ability to regain her composure after an episode of crying.  [name] helped her identify coloring and knitting as positive coping mechanisms and [name] agrees.  [name] states she takes Zoloft.  CSW notes her dose is 50mg and feels it may be beneficial for her to speak with her provider about increasing this dose, as it is a relatively low dose, given the current situation, her hx and  the fact that she states she had "horrible depression after my first baby."  She is in agreement.  CSW recommends counseling and [name] states she had a counselor in the past, Kathy Grumblatt, and that is the only person she would like to do counseling with.  CSW is familiar with this counselor and unsure if she is still in practice in this area, but asked for [name]'s permission in attempting to find out.  [name] agreed.  CSW searched for Ms. Grumblatt and found her.  She answered number CSW found for her and is willing to complete an intake with [name].  [name] seemed pleased with this.  CSW encouraged [name] to continue to use the Edinburgh Postnatal Depression Screen to self-evaluate during the postpartum time period and remember that baby's NICU stay is temporary and necessary.  CSW encouraged her to think about her discharge as "before baby," rather than "without baby," and to focus on her daughter, rather than her surroundings.  [name] was receptive.  [name] seemed appreciative.  CSW validated [name]'s feelings of sadness regarding the separation of her and her newborn and encouraged her to take this time to rest and discussed the importance of caring for herself. CSW informed them of ongoing support services offered by NICU CSW and asked them to call any time.  Parents thanked CSW for the visit and state no further questions, concerns or needs at this time.  CSW Plan/Description:  Psychosocial Support and Ongoing Assessment of Needs, Sudden Infant Death Syndrome (SIDS) Education, Perinatal Mood and Anxiety Disorder (PMADs) Education, Other Information/Referral to Community Resources    Magdala Brahmbhatt Elizabeth, LCSW 10/07/2017, 3:38 PM 

## 2017-10-07 NOTE — Progress Notes (Signed)
PPD #2 Doing ok, sore from PPBTL, baby stable in NICU Afeb, VSS Abd- soft, fundus firm, incision intact D/c home

## 2017-10-07 NOTE — Op Note (Signed)
NAME:  Nicholes StairsEARSON, Elenor                 ACCOUNT NO.:  MEDICAL RECORD NO.:  [id number]  LOCATION:                                 FACILITY:  PHYSICIAN:  Sherron MondayJody Bovard, MD             DATE OF BIRTH:  DATE OF PROCEDURE:  10/06/2017 DATE OF DISCHARGE:                              OPERATIVE REPORT   PREOPERATIVE DIAGNOSIS:  Undesired fertility.  POSTOPERATIVE DIAGNOSIS:  Undesired fertility, status post postpartum bilateral tubal ligation by [place] method.  PROCEDURE:  Postpartum tubal ligation by [place] method.  SURGEON:  Sherron MondayJody Bovard, MD  ANESTHESIA:  Epidural with 10 mL of 0.25% Marcaine for local anesthetic and nitrous oxide for the patient's comfort.  EBL:  Approximately 25 mL.  URINE OUTPUT AND IV FLUIDS:  Per Anesthesia note.  PATHOLOGY:  Bilateral tubal segments.  COMPLICATIONS:  None.  DESCRIPTION OF PROCEDURE:  After informed consent was reviewed with the patient including risks, benefits, and alternatives of the surgical procedure, she was transported to the OR, placed on the table in supine position.  After her epidural had been re-dosed and found to be adequate, she was prepped and draped in the normal sterile fashion.  A Foley catheter was sterilely placed.  An approximately 2 cm infraumbilical incision was made, carried through the underlying layer of fascia sharply.  The fascia was elevated with Kocher clamps and incised.  This incision was extended superiorly and inferiorly.  The peritoneum was entered bluntly.  The uterus was at this level.  The left tube was initially identified, followed out to the fimbriated end, doubly ligated on either side with the [place] method.  The intervening portion of tube was excised and sent to Pathology.  The bowel prior to location of the tubes had been packed away with a moist tape sponge. The patient was air-planed to the left.  Her right tube was identified, followed out to the fimbriated end, and ligated in the  [place]land fashion. It was doubly ligated on either side.  The ties on the cornual side slipped and they were regrasped with a Tresa EndoKelly and a little bit more tube was removed and it was re-ligated, doubly ligated with plain gut.  It was noted to be hemostatic as was the left.  The fascia was reapproximated with 0 Vicryl.  A deep stitch was placed in the subcu with 0 Vicryl.  The skin was closed with 4-0 Vicryl in a subcuticular fashion.  Benzoin and Steri-Strips were applied as was a honeycomb.  The patient tolerated the procedure well.  Sponge, lap, and needle counts were correct x2 per the operating staff.     Sherron MondayJody Bovard, MD     JB/MEDQ  D:  10/06/2017  T:  10/06/2017  Job:  [id number]

## 2017-10-07 NOTE — Anesthesia Postprocedure Evaluation (Signed)
Anesthesia Post Note  Patient: Christine Beard  Procedure(s) Performed: POST PARTUM TUBAL LIGATION (N/A )     Patient location during evaluation: Mother Baby Anesthesia Type: Epidural Level of consciousness: awake Pain management: pain level controlled Vital Signs Assessment: post-procedure vital signs reviewed and stable Respiratory status: spontaneous breathing Cardiovascular status: stable Postop Assessment: epidural receding and patient able to bend at knees Anesthetic complications: no Comments: Ambulating preparing to go home    Last Vitals:  Vitals:   10/06/17 1750 10/07/17 0550  BP: 135/69 112/61  Pulse: 81 80  Resp: 16 18  Temp: 36.5 C 36.9 C  SpO2: 99%     Last Pain:  Vitals:   10/07/17 1327  TempSrc:   PainSc: 10-Worst pain ever   Pain Goal: Patients Stated Pain Goal: 2 (10/06/17 1934)               Edison PaceWILKERSON,Desare Duddy

## 2017-10-07 NOTE — Progress Notes (Signed)
CSW attempted to meet with [name] to offer support and complete assessment but she was working with Advertising copywriterlactation consultant at this time.  CSW will attempt again at a later time.

## 2017-10-07 NOTE — Lactation Note (Signed)
Lactation Consultation Note  Patient Name: Christine Beard [id number]'s Date: 10/07/2017  Pecola LeisureBaby is in NICU  Per mom has pumped 3-5 times in the last 24 hours and the most expressed In 2 ml. Per mom nipples are alittle sore. LC reviewed sore nipple and engorgement prevention and tx.  Per mom has a DEBP ( mom not sure of the Type ), and plans to find out. [facility] and mom wanted to talk to dad. )  LC reviewed supply and demand and the importance of consistent pumping both breast 15 - 20 mins  At least 8 x's a day. Also hand expressing before and after the pumping.  Mother informed of post-discharge support and given phone number to the lactation department, including services for phone call assistance; out-patient appointments; and breastfeeding support group. List of other breastfeeding resources in the community given in the handout. Encouraged mother to call for problems or concerns related to breastfeeding. [facility]. Mom plans to call on the nurses light if she wants to see them.      Maternal Data    Feeding    LATCH Score                   Interventions    Lactation Tools Discussed/Used     Consult Status      Christine Beard 10/07/2017, 9:15 AM

## 2017-10-07 NOTE — Discharge Instructions (Signed)
As per discharge pamphlet °

## 2017-10-13 ENCOUNTER — Inpatient Hospital Stay (HOSPITAL_COMMUNITY)
Admission: AD | Admit: 2017-10-13 | Discharge: 2017-10-14 | Disposition: A | Discharge status: Home / Self Care | Payer: Medicaid Other | Source: Ambulatory Visit | Attending: Obstetrics and Gynecology | Admitting: Obstetrics and Gynecology

## 2017-10-13 DIAGNOSIS — Z87891 Personal history of nicotine dependence: Secondary | ICD-10-CM | POA: Diagnosis not present

## 2017-10-13 DIAGNOSIS — R51 Headache: Secondary | ICD-10-CM | POA: Insufficient documentation

## 2017-10-13 DIAGNOSIS — I10 Essential (primary) hypertension: Secondary | ICD-10-CM | POA: Diagnosis present

## 2017-10-13 DIAGNOSIS — E039 Hypothyroidism, unspecified: Secondary | ICD-10-CM | POA: Diagnosis not present

## 2017-10-13 DIAGNOSIS — F329 Major depressive disorder, single episode, unspecified: Secondary | ICD-10-CM | POA: Diagnosis not present

## 2017-10-13 DIAGNOSIS — O165 Unspecified maternal hypertension, complicating the puerperium: Secondary | ICD-10-CM | POA: Insufficient documentation

## 2017-10-13 DIAGNOSIS — F419 Anxiety disorder, unspecified: Secondary | ICD-10-CM | POA: Diagnosis not present

## 2017-10-13 NOTE — MAU Note (Signed)
PT SAYS SHE DEL VAG ON 10-05-2017 - BY DR BOVARD.  - 38.[redacted] WEEKS GESTATION.  BABY IS IN NICU   . PT IS PUMPING  TO BREASTFEED.. NO BP PROBLEMS WHILE PREG.  AT HOME TONIGHT - 166/93   , 168/90.  SHE FEELS  H/A-  TOOK TYLENOL XS 2 TABS-  SHE FEEL ASLEEP.    TONIGHT IN NICU- H/A WORSE     HAD BTL,  BACK HURTS AND LOWER ABD .  SAYS LEGS ARE SWOLLEN .

## 2017-10-14 ENCOUNTER — Emergency Department (HOSPITAL_COMMUNITY)
Admission: EM | Admit: 2017-10-14 | Discharge: 2017-10-14 | Discharge status: Against Medical Advice | Payer: Medicaid Other

## 2017-10-14 DIAGNOSIS — O165 Unspecified maternal hypertension, complicating the puerperium: Secondary | ICD-10-CM

## 2017-10-14 LAB — URINALYSIS, ROUTINE W REFLEX MICROSCOPIC
Bilirubin Urine: NEGATIVE
GLUCOSE, UA: NEGATIVE mg/dL
HGB URINE DIPSTICK: NEGATIVE
KETONES UR: NEGATIVE mg/dL
Leukocytes, UA: NEGATIVE
Nitrite: NEGATIVE
PH: 7 (ref 5.0–8.0)
PROTEIN: NEGATIVE mg/dL
Specific Gravity, Urine: 1.009 (ref 1.005–1.030)

## 2017-10-14 LAB — CBC
HEMATOCRIT: 37.2 % (ref 36.0–46.0)
Hemoglobin: 12.2 g/dL (ref 12.0–15.0)
MCH: 29.1 pg (ref 26.0–34.0)
MCHC: 32.8 g/dL (ref 30.0–36.0)
MCV: 88.8 fL (ref 78.0–100.0)
Platelets: 211 10*3/uL (ref 150–400)
RBC: 4.19 MIL/uL (ref 3.87–5.11)
RDW: 13.3 % (ref 11.5–15.5)
WBC: 13.6 10*3/uL — ABNORMAL HIGH (ref 4.0–10.5)

## 2017-10-14 LAB — COMPREHENSIVE METABOLIC PANEL
ALT: 15 U/L (ref 14–54)
ANION GAP: 12 (ref 5–15)
AST: 17 U/L (ref 15–41)
Albumin: 3.2 g/dL — ABNORMAL LOW (ref 3.5–5.0)
Alkaline Phosphatase: 114 U/L (ref 38–126)
BILIRUBIN TOTAL: 0.5 mg/dL (ref 0.3–1.2)
BUN: 15 mg/dL (ref 6–20)
CALCIUM: 8.4 mg/dL — AB (ref 8.9–10.3)
CO2: 21 mmol/L — ABNORMAL LOW (ref 22–32)
Chloride: 109 mmol/L (ref 101–111)
Creatinine, Ser: 0.75 mg/dL (ref 0.44–1.00)
GFR calc non Af Amer: >60 mL/min (ref >60)
GLUCOSE: 97 mg/dL (ref 65–99)
POTASSIUM: 3.5 mmol/L (ref 3.5–5.1)
SODIUM: 142 mmol/L (ref 135–145)
TOTAL PROTEIN: 6.7 g/dL (ref 6.5–8.1)

## 2017-10-14 LAB — PROTEIN / CREATININE RATIO, URINE
Creatinine, Urine: 35 mg/dL
PROTEIN CREATININE RATIO: 0.2 mg/mg{creat} — AB (ref 0.00–0.15)
TOTAL PROTEIN, URINE: 7 mg/dL

## 2017-10-14 MED ORDER — LABETALOL HCL 100 MG PO TABS
100.0000 mg | ORAL_TABLET | Freq: Once | ORAL | Status: AC
  Administered 2017-10-14: 100 mg via ORAL
  Filled 2017-10-14: qty 1

## 2017-10-14 MED ORDER — LABETALOL HCL 100 MG PO TABS: 100.0000 mg | ORAL_TABLET | Freq: Two times a day (BID) | ORAL | 0 refills | Status: DC

## 2017-10-14 MED ORDER — IBUPROFEN 600 MG PO TABS
600.0000 mg | ORAL_TABLET | Freq: Four times a day (QID) | ORAL | Status: DC | PRN
  Administered 2017-10-14: 600 mg via ORAL
  Filled 2017-10-14: qty 1

## 2017-10-14 MED ORDER — LABETALOL HCL 5 MG/ML IV SOLN
20.0000 mg | INTRAVENOUS | Status: DC | PRN
  Administered 2017-10-14: 20 mg via INTRAVENOUS
  Filled 2017-10-14: qty 4

## 2017-10-14 MED ORDER — SODIUM CHLORIDE 0.9 % IV SOLN
INTRAVENOUS | Status: DC
  Administered 2017-10-14: 01:00:00 via INTRAVENOUS

## 2017-10-14 MED ORDER — METOCLOPRAMIDE HCL 5 MG/ML IJ SOLN
10.0000 mg | Freq: Once | INTRAMUSCULAR | Status: AC
Start: 2017-10-14 — End: 2017-10-14
  Administered 2017-10-14: 10 mg via INTRAVENOUS
  Filled 2017-10-14: qty 2

## 2017-10-14 MED ORDER — HYDRALAZINE HCL 20 MG/ML IJ SOLN: 10.0000 mg | Freq: Once | INTRAMUSCULAR | Status: DC | PRN

## 2017-10-14 NOTE — ED Triage Notes (Signed)
Called for triage X2 with no answer 

## 2017-10-14 NOTE — MAU Provider Note (Signed)
Chief Complaint:  Headache   First Provider Initiated Contact with Patient 10/14/17 0002       HPI: Christine Beard is a 27 y.o. [id number] who presents to maternity admissions reporting elevated blood pressure and headache.  States legs are swollen. Had postpartum preeclampsia with last baby.. She reports vaginal bleeding, no  vaginal itching/burning, urinary symptoms, dizziness, n/v, or fever/chills.    Headache   This is a new problem. The current episode started today. The problem occurs constantly. The problem has been unchanged. The pain does not radiate. The quality of the pain is described as aching. The pain is moderate. Associated symptoms include back pain. Pertinent negatives include no abdominal pain, blurred vision, coughing, fever, muscle aches, nausea, neck pain, photophobia, sinus pressure or visual change. Nothing aggravates the symptoms. She has tried acetaminophen for the symptoms. The treatment provided no relief. Her past medical history is significant for hypertension.  Hypertension  This is a new problem. The current episode started today. Associated symptoms include headaches and peripheral edema. Pertinent negatives include no anxiety, blurred vision or neck pain. There are no associated agents to hypertension. There are no known risk factors for coronary artery disease. Past treatments include nothing. There are no compliance problems.     RN Note: PT SAYS SHE DEL VAG ON 10-05-2017 - BY DR BOVARD.  - 38.[redacted] WEEKS GESTATION.  BABY IS IN NICU   . PT IS PUMPING  TO BREASTFEED.. NO BP PROBLEMS WHILE PREG.  AT HOME TONIGHT - 166/93   , 168/90.  SHE FEELS  H/A-  TOOK TYLENOL XS 2 TABS-  SHE FEEL ASLEEP.    TONIGHT IN NICU- H/A WORSE     HAD BTL,  BACK HURTS AND LOWER ABD .  SAYS LEGS ARE SWOLLEN     Past Medical History: Past Medical History:  Diagnosis Date  . Anxiety   . Depression   . Headache   . Hypothyroidism   . S/P tubal ligation 10/06/2017  . SVD (spontaneous  vaginal delivery) 10/05/2017  . Thyroid disease     Past obstetric history: OB History  Gravida Para Term Preterm AB Living  2 2 2     2   SAB TAB Ectopic Multiple Live Births        0 2    # Outcome Date GA Lbr Len/2nd Weight Sex Delivery Anes PTL Lv  2 Term 10/05/17 [redacted]w[redacted]d 15:04 / 00:05 5 lb 3 oz (2.353 kg) F Vag-Spont EPI  LIV  1 Term 05/27/15 [redacted]w[redacted]d 04:04 / 00:22 5 lb 5.2 oz (2.415 kg) F Vag-Spont EPI  LIV      Past Surgical History: Past Surgical History:  Procedure Laterality Date  . TOE SURGERY    . TONSILLECTOMY    . TUBAL LIGATION N/A 10/06/2017   Procedure: POST PARTUM TUBAL LIGATION;  Surgeon: Sherian Rein, MD;  Location: [facility];  Service: Gynecology;  Laterality: N/A;    Family History: Family History  Problem Relation Age of Onset  . Diabetes Maternal Grandmother   . Diabetes Paternal Grandmother     Social History: Social History   Tobacco Use  . Smoking status: Former Smoker    Last attempt to quit: 09/03/2014    Years since quitting: 3.1  . Smokeless tobacco: Never Used  Substance Use Topics  . Alcohol use: No  . Drug use: No    Allergies:  Allergies  Allergen Reactions  . Codeine Nausea And Vomiting    Meds:  Medications  Prior to Admission  Medication Sig Dispense Refill Last Dose  . acetaminophen (TYLENOL) 500 MG tablet Take 1,000 mg by mouth every 6 (six) hours as needed for mild pain or headache.    Past Week at Unknown time  . calcium carbonate (TUMS - DOSED IN MG ELEMENTAL CALCIUM) 500 MG chewable tablet Chew 2 tablets by mouth daily as needed for indigestion or heartburn.   Past Week at Unknown time  . ibuprofen (ADVIL,MOTRIN) 600 MG tablet Take 1 tablet (600 mg total) by mouth every 6 (six) hours. 30 tablet 0   . levothyroxine (SYNTHROID, LEVOTHROID) 50 MCG tablet levothyroxine 50 mcg tablet  take 1 tablet by mouth once daily   Past Month at Unknown time  . oxyCODONE (OXY IR/ROXICODONE) 5 MG immediate release tablet Take  1 tablet (5 mg total) by mouth every 4 (four) hours as needed for severe pain. 10 tablet 0   . Prenatal Vit-Fe Fumarate-FA (PRENATAL MULTIVITAMIN) TABS tablet Take 1 tablet by mouth daily at 12 noon.   10/03/2017 at Unknown time  . promethazine (PHENERGAN) 12.5 MG tablet Take 1 tablet (12.5 mg total) by mouth every 6 (six) hours as needed for nausea or vomiting. 30 tablet 0 Past Month at Unknown time  . ranitidine (ZANTAC) 150 MG tablet Take 1 tablet (150 mg total) by mouth 2 (two) times daily. 60 tablet 1 Past Month at Unknown time  . sertraline (ZOLOFT) 50 MG tablet Take 50 mg by mouth daily.   Past Month at Unknown time    I have reviewed patient's Past Medical Hx, Surgical Hx, Family Hx, Social Hx, medications and allergies.  ROS:  Review of Systems  Constitutional: Negative for fever.  HENT: Negative for sinus pressure.   Eyes: Negative for blurred vision and photophobia.  Respiratory: Negative for cough.   Gastrointestinal: Negative for abdominal pain and nausea.  Musculoskeletal: Positive for back pain. Negative for neck pain.  Neurological: Positive for headaches.   Other systems negative     Physical Exam   Patient Vitals for the past 24 hrs:  BP Temp Temp src Pulse Resp Height Weight  10/13/17 2331 (!) 182/90 - - 64 - - -  10/13/17 2325 (!) 184/85 - - 64 - - -  10/13/17 2258 (!) 176/82 97.6 F (36.4 C) Oral 66 (!) 22 4' 7.5" (1.41 m) 121 lb 4 oz (55 kg)   Constitutional: Well-developed, well-nourished female in no acute distress.  Cardiovascular: normal rate and rhythm, no ectopy audible, S1 & S2 heard, no murmur Respiratory: normal effort, no distress. Lungs CTAB with no wheezes or crackles GI: Abd soft, non-tender.  Nondistended.  No rebound, No guarding.  Bowel Sounds audible  MS: Extremities nontender, Trace edema, normal ROM Neurologic: Alert and oriented x 4.   Grossly nonfocal.  DTRs 2+ with no clonus GU: Neg CVAT. Skin:  Warm and Dry Psych:  Affect  appropriate.  PELVIC EXAM: deferred  Labs: --/--/O NEG (01/01 0810) Results for orders placed or performed during the hospital encounter of 10/13/17 (from the past 24 hour(s))  Urinalysis, Routine w reflex microscopic     Status: Abnormal   Collection Time: 10/13/17 11:10 PM  Result Value Ref Range   Color, Urine STRAW (A) YELLOW   APPearance CLEAR CLEAR   Specific Gravity, Urine 1.009 1.005 - 1.030   pH 7.0 5.0 - 8.0   Glucose, UA NEGATIVE NEGATIVE mg/dL   Hgb urine dipstick NEGATIVE NEGATIVE   Bilirubin Urine NEGATIVE NEGATIVE   Ketones,  ur NEGATIVE NEGATIVE mg/dL   Protein, ur NEGATIVE NEGATIVE mg/dL   Nitrite NEGATIVE NEGATIVE   Leukocytes, UA NEGATIVE NEGATIVE  Protein / creatinine ratio, urine     Status: Abnormal   Collection Time: 10/13/17 11:10 PM  Result Value Ref Range   Creatinine, Urine 35.00 mg/dL   Total Protein, Urine 7 mg/dL   Protein Creatinine Ratio 0.20 (H) 0.00 - 0.15 mg/mg[Cre]  CBC     Status: Abnormal   Collection Time: 10/13/17 11:18 PM  Result Value Ref Range   WBC 13.6 (H) 4.0 - 10.5 K/uL   RBC 4.19 3.87 - 5.11 MIL/uL   Hemoglobin 12.2 12.0 - 15.0 g/dL   HCT 16.1 09.6 - 04.5 %   MCV 88.8 78.0 - 100.0 fL   MCH 29.1 26.0 - 34.0 pg   MCHC 32.8 30.0 - 36.0 g/dL   RDW 40.9 81.1 - 91.4 %   Platelets 211 150 - 400 K/uL  Comprehensive metabolic panel     Status: Abnormal   Collection Time: 10/13/17 11:18 PM  Result Value Ref Range   Sodium 142 135 - 145 mmol/L   Potassium 3.5 3.5 - 5.1 mmol/L   Chloride 109 101 - 111 mmol/L   CO2 21 (L) 22 - 32 mmol/L   Glucose, Bld 97 65 - 99 mg/dL   BUN 15 6 - 20 mg/dL   Creatinine, Ser 7.82 0.44 - 1.00 mg/dL   Calcium 8.4 (L) 8.9 - 10.3 mg/dL   Total Protein 6.7 6.5 - 8.1 g/dL   Albumin 3.2 (L) 3.5 - 5.0 g/dL   AST 17 15 - 41 U/L   ALT 15 14 - 54 U/L   Alkaline Phosphatase 114 38 - 126 U/L   Total Bilirubin 0.5 0.3 - 1.2 mg/dL   GFR calc non Af Amer >60 >60 mL/min   GFR calc Af Amer >60 >60 mL/min   Anion  gap 12 5 - 15    Imaging:  No results found.  MAU Course/MDM: I have ordered labs as follows: PIH labs.  These returned as normal.  Platelet count is higher than prior one. Imaging ordered: none Results reviewed.   Consult Dr Jackelyn Knife with presentation, vitals, and lab results. .   Treatments in MAU included Labetalol IV per protocol   THis produced excellent results .   Vitals:   10/14/17 0052 10/14/17 0113 10/14/17 0131 10/14/17 0146  BP:  (!) 126/54 (!) 141/67 (!) 151/64  Pulse:  67 60 (!) 59  Resp:      Temp:      TempSrc:      SpO2: 100% 98%    Weight:      Height:       BP prior to discharge went back to 160s systolic, so I gave a second dose of Labetalol 100mg  po.  Headache was treated with Reglan and ibuprofen with good results. Decision made to discharge home with Labetalol PO and close followup   Pt stable at time of discharge.  Assessment: Postpartum hypertension Postpartum hypertension - Plan: Discharge patient   Plan: Discharge home Recommend Call office today for Monday appointment Rx sent for Labetalol for hypertension close followup of BP.  May need a recheck today.  Encouraged to return here or to other Urgent Care/ED if she develops worsening of symptoms, increase in pain, fever, or other concerning symptoms.   Wynelle Bourgeois CNM, MSN Certified Nurse-Midwife 10/14/2017 12:03 AM

## 2017-10-14 NOTE — Discharge Instructions (Signed)
Postpartum Hypertension Postpartum hypertension is high blood pressure after pregnancy that remains higher than normal for more than two days after delivery. You may not realize that you have postpartum hypertension if your blood pressure is not being checked regularly. In some cases, postpartum hypertension will go away on its own, usually within a week of delivery. However, for some women, medical treatment is required to prevent serious complications, such as seizures or stroke. The following things can affect your blood pressure:  The type of delivery you had.  Having received IV fluids or other medicines during or after delivery.  What are the causes? Postpartum hypertension may be caused by any of the following or by a combination of any of the following:  Hypertension that existed before pregnancy (chronic hypertension).  Gestational hypertension.  Preeclampsia or eclampsia.  Receiving a lot of fluid through an IV during or after delivery.  Medicines.  HELLP syndrome.  Hyperthyroidism.  Stroke.  Other rare neurological or blood disorders.  In some cases, the cause may not be known. What increases the risk? Postpartum hypertension can be related to one or more risk factors, such as:  Chronic hypertension. In some cases, this may not have been diagnosed before pregnancy.  Obesity.  Type 2 diabetes.  Kidney disease.  Family history of preeclampsia.  Other medical conditions that cause hormonal imbalances.  What are the signs or symptoms? As with all types of hypertension, postpartum hypertension may not have any symptoms. Depending on how high your blood pressure is, you may experience:  Headaches. These may be mild, moderate, or severe. They may also be steady, constant, or sudden in onset (thunderclap headache).  Visual changes.  Dizziness.  Shortness of breath.  Swelling of your hands, feet, lower legs, or face. In some cases, you may have swelling  in more than one of these locations.  Heart palpitations or a racing heartbeat.  Difficulty breathing while lying down.  Decreased urination.  Other rare signs and symptoms may include:  Sweating more than usual. This lasts longer than a few days after delivery.  Chest pain.  Sudden dizziness when you get up from sitting or lying down.  Seizures.  Nausea or vomiting.  Abdominal pain.  How is this diagnosed? The diagnosis of postpartum hypertension is made through a combination of physical examination findings and testing of your blood and urine. You may also have additional tests, such as a CT scan or an MRI, to check for other complications of postpartum hypertension. How is this treated? When blood pressure is high enough to require treatment, your options may include:  Medicines to reduce blood pressure (antihypertensives). Tell your health care provider if you are breastfeeding or if you plan to breastfeed. There are many antihypertensive medicines that are safe to take while breastfeeding.  Stopping medicines that may be causing hypertension.  Treating medical conditions that are causing hypertension.  Treating the complications of hypertension, such as seizures, stroke, or kidney problems.  Your health care provider will also continue to monitor your blood pressure closely and repeatedly until it is within a safe range for you. Follow these instructions at home:  Take medicines only as directed by your health care provider.  Get regular exercise after your health care provider tells you that it is safe.  Follow your health care providers recommendations on fluid and salt restrictions.  Do not use any tobacco products, including cigarettes, chewing tobacco, or electronic cigarettes. If you need help quitting, ask your health care  provider.  Keep all follow-up visits as directed by your health care provider. This is important. Contact a health care provider  if:  Your symptoms get worse.  You have new symptoms, such as: ? Headache. ? Dizziness. ? Visual changes. Get help right away if:  You develop a severe or sudden headache.  You have seizures.  You develop numbness or weakness on one side of your body.  You have difficulty thinking, speaking, or swallowing.  You develop severe abdominal pain.  You develop difficulty breathing, chest pain, a racing heartbeat, or heart palpitations. These symptoms may represent a serious problem that is an emergency. Do not wait to see if the symptoms will go away. Get medical help right away. Call your local emergency services (911 in the [place] Do not drive yourself to the hospital. This information is not intended to replace advice given to you by your health care provider. Make sure you discuss any questions you have with your health care provider. Document Released: 05/24/2014 Document Revised: 02/23/2016 Document Reviewed: 04/04/2014 Elsevier Interactive Patient Education  2018 [employer]  [employer] analyst[employer]sevier Interactive Patient Education  [employer]2018 [employer] Inc[employer]

## 2017-10-14 NOTE — MAU Note (Signed)
2330- informed CNM about pt BP. She will call Dr Jackelyn KnifeMeisinger. Pt ordered IV  Labetolol

## 2017-10-29 ENCOUNTER — Encounter (HOSPITAL_COMMUNITY): Payer: Self-pay | Admitting: *Deleted

## 2017-10-29 ENCOUNTER — Observation Stay (HOSPITAL_COMMUNITY)
Admission: AD | Admit: 2017-10-29 | Discharge: 2017-10-31 | Disposition: A | Discharge status: Home / Self Care | Payer: Medicaid Other | Source: Ambulatory Visit | Attending: Obstetrics and Gynecology | Admitting: Obstetrics and Gynecology

## 2017-10-29 DIAGNOSIS — I10 Essential (primary) hypertension: Secondary | ICD-10-CM | POA: Diagnosis present

## 2017-10-29 DIAGNOSIS — R519 Headache, unspecified: Secondary | ICD-10-CM

## 2017-10-29 DIAGNOSIS — R51 Headache: Secondary | ICD-10-CM | POA: Diagnosis not present

## 2017-10-29 DIAGNOSIS — Z87891 Personal history of nicotine dependence: Secondary | ICD-10-CM | POA: Insufficient documentation

## 2017-10-29 DIAGNOSIS — O165 Unspecified maternal hypertension, complicating the puerperium: Secondary | ICD-10-CM

## 2017-10-29 DIAGNOSIS — Z79899 Other long term (current) drug therapy: Secondary | ICD-10-CM | POA: Diagnosis not present

## 2017-10-29 DIAGNOSIS — E039 Hypothyroidism, unspecified: Secondary | ICD-10-CM | POA: Diagnosis not present

## 2017-10-29 DIAGNOSIS — O1003 Pre-existing essential hypertension complicating the puerperium: Principal | ICD-10-CM | POA: Insufficient documentation

## 2017-10-29 LAB — URINALYSIS, ROUTINE W REFLEX MICROSCOPIC
Bacteria, UA: NONE SEEN
Bilirubin Urine: NEGATIVE
GLUCOSE, UA: NEGATIVE mg/dL
Ketones, ur: NEGATIVE mg/dL
NITRITE: NEGATIVE
PH: 7 (ref 5.0–8.0)
PROTEIN: NEGATIVE mg/dL
Specific Gravity, Urine: 1.003 — ABNORMAL LOW (ref 1.005–1.030)

## 2017-10-29 LAB — COMPREHENSIVE METABOLIC PANEL
ALBUMIN: 4 g/dL (ref 3.5–5.0)
ALK PHOS: 70 U/L (ref 38–126)
ALT: 15 U/L (ref 14–54)
AST: 12 U/L — ABNORMAL LOW (ref 15–41)
Anion gap: 8 (ref 5–15)
BILIRUBIN TOTAL: 0.5 mg/dL (ref 0.3–1.2)
BUN: 13 mg/dL (ref 6–20)
CALCIUM: 9.1 mg/dL (ref 8.9–10.3)
CO2: 23 mmol/L (ref 22–32)
CREATININE: 0.6 mg/dL (ref 0.44–1.00)
Chloride: 107 mmol/L (ref 101–111)
GFR calc Af Amer: >60 mL/min (ref >60)
GLUCOSE: 104 mg/dL — AB (ref 65–99)
Potassium: 3.7 mmol/L (ref 3.5–5.1)
Sodium: 138 mmol/L (ref 135–145)
TOTAL PROTEIN: 7.2 g/dL (ref 6.5–8.1)

## 2017-10-29 LAB — PROTEIN / CREATININE RATIO, URINE
Creatinine, Urine: 16 mg/dL
Total Protein, Urine: <6 mg/dL

## 2017-10-29 LAB — CBC
HEMATOCRIT: 37.5 % (ref 36.0–46.0)
HEMOGLOBIN: 12.2 g/dL (ref 12.0–15.0)
MCH: 29.1 pg (ref 26.0–34.0)
MCHC: 32.5 g/dL (ref 30.0–36.0)
MCV: 89.5 fL (ref 78.0–100.0)
Platelets: 213 10*3/uL (ref 150–400)
RBC: 4.19 MIL/uL (ref 3.87–5.11)
RDW: 13.6 % (ref 11.5–15.5)
WBC: 8.5 10*3/uL (ref 4.0–10.5)

## 2017-10-29 MED ORDER — LABETALOL HCL 5 MG/ML IV SOLN
20.0000 mg | INTRAVENOUS | Status: DC | PRN
  Administered 2017-10-29: 20 mg via INTRAVENOUS
  Filled 2017-10-29: qty 4

## 2017-10-29 MED ORDER — HYDRALAZINE HCL 20 MG/ML IJ SOLN: 10.0000 mg | Freq: Once | INTRAMUSCULAR | Status: DC | PRN

## 2017-10-29 MED ORDER — BUTALBITAL-APAP-CAFFEINE 50-325-40 MG PO TABS
1.0000 | ORAL_TABLET | Freq: Once | ORAL | Status: AC
  Administered 2017-10-29: 1 via ORAL
  Filled 2017-10-29: qty 1

## 2017-10-29 MED ORDER — LACTATED RINGERS IV SOLN: INTRAVENOUS | Status: DC

## 2017-10-29 NOTE — MAU Note (Signed)
Post partum 10/05/2017(delivery). Has had elevated pressures. On labetalol. Dose increased this week to 200mg  BID. Increased it today to TID. B/P at home was 170/107.  C/o headache and dizziness.

## 2017-10-29 NOTE — MAU Provider Note (Addendum)
History     CSN: [id number]  Arrival date and time: 10/29/17 2051   First Provider Initiated Contact with Patient 10/29/17 2132      Chief Complaint  Patient presents with  . Hypertension   HPI  Christine Beard is a 27 y.o. [id number] who presents with hypertension. Patient is s/p SVD on 10/06/17. Has history of PP preeclampsia after her first delivery.  Was seen in MAU on 1/11 for hypertension, had normal labs & was discharged home on labetalol. This past Friday she was in the office & labetalol dose was increased. This weekend she reports having continued high BPs with severe ranges BPs of 170s/100s. Was told to increase her labetalol to 200 mg TID. Last dose was taken around 8 pm tonight just prior to coming to MAU. Endorses frontal headache today. Rates pain 4/10. Took 2 ES tylenol earlier today with minimal relief. Endorses intermittent episodes of blurred vision since last night. Denies epigastric pain.   OB History    Gravida Para Term Preterm AB Living   2 2 2     2    SAB TAB Ectopic Multiple Live Births         0 2      Past Medical History:  Diagnosis Date  . Anxiety   . Depression   . Headache   . Hypothyroidism   . S/P tubal ligation 10/06/2017  . SVD (spontaneous vaginal delivery) 10/05/2017  . Thyroid disease     Past Surgical History:  Procedure Laterality Date  . TOE SURGERY    . TONSILLECTOMY    . TUBAL LIGATION N/A 10/06/2017   Procedure: POST PARTUM TUBAL LIGATION;  Surgeon: Sherian Rein, MD;  Location: [facility];  Service: Gynecology;  Laterality: N/A;    Family History  Problem Relation Age of Onset  . Diabetes Maternal Grandmother   . Diabetes Paternal Grandmother     Social History   Tobacco Use  . Smoking status: Former Smoker    Last attempt to quit: 09/03/2014    Years since quitting: 3.1  . Smokeless tobacco: Never Used  Substance Use Topics  . Alcohol use: No  . Drug use: No    Allergies:  Allergies  Allergen  Reactions  . Codeine Nausea And Vomiting    Medications Prior to Admission  Medication Sig Dispense Refill Last Dose  . acetaminophen (TYLENOL) 500 MG tablet Take 1,000 mg by mouth every 6 (six) hours as needed for mild pain or headache.    Past Week at Unknown time  . calcium carbonate (TUMS - DOSED IN MG ELEMENTAL CALCIUM) 500 MG chewable tablet Chew 2 tablets by mouth daily as needed for indigestion or heartburn.   Past Week at Unknown time  . ibuprofen (ADVIL,MOTRIN) 600 MG tablet Take 1 tablet (600 mg total) by mouth every 6 (six) hours. 30 tablet 0   . labetalol (NORMODYNE) 100 MG tablet Take 1 tablet (100 mg total) by mouth 2 (two) times daily. 30 tablet 0   . levothyroxine (SYNTHROID, LEVOTHROID) 50 MCG tablet levothyroxine 50 mcg tablet  take 1 tablet by mouth once daily   Past Month at Unknown time  . oxyCODONE (OXY IR/ROXICODONE) 5 MG immediate release tablet Take 1 tablet (5 mg total) by mouth every 4 (four) hours as needed for severe pain. 10 tablet 0   . Prenatal Vit-Fe Fumarate-FA (PRENATAL MULTIVITAMIN) TABS tablet Take 1 tablet by mouth daily at 12 noon.   10/03/2017 at Unknown time  .  promethazine (PHENERGAN) 12.5 MG tablet Take 1 tablet (12.5 mg total) by mouth every 6 (six) hours as needed for nausea or vomiting. 30 tablet 0 Past Month at Unknown time  . ranitidine (ZANTAC) 150 MG tablet Take 1 tablet (150 mg total) by mouth 2 (two) times daily. 60 tablet 1 Past Month at Unknown time  . sertraline (ZOLOFT) 50 MG tablet Take 50 mg by mouth daily.   Past Month at Unknown time    Review of Systems  Constitutional: Negative.   Eyes: Positive for visual disturbance.  Gastrointestinal: Negative.   Genitourinary: Negative.   Neurological: Positive for headaches.   Physical Exam   Blood pressure (!) 150/78, pulse 75, temperature 98.6 F (37 C), resp. rate 18, height 4' 7.5" (1.41 m), weight 116 lb (52.6 kg), unknown if currently breastfeeding. Patient Vitals for the past 24  hrs:  BP Temp Pulse Resp Height Weight  10/29/17 2301 (!) 150/78 - 75 - - -  10/29/17 2246 (!) 149/74 - 72 - - -  10/29/17 2231 (!) 148/81 - 75 - - -  10/29/17 2216 (!) 158/84 - 73 - - -  10/29/17 2201 (!) 156/84 - 73 - - -  10/29/17 2146 (!) 173/93 - 73 - - -  10/29/17 2131 (!) 165/84 - 73 - - -  10/29/17 2127 (!) 169/90 - 73 - - -  10/29/17 2123 (!) 171/96 98.6 F (37 C) 74 18 4' 7.5" (1.41 m) 116 lb (52.6 kg)    Physical Exam  Nursing note and vitals reviewed. Constitutional: She is oriented to person, place, and time. She appears well-developed and well-nourished. No distress.  HENT:  Head: Normocephalic and atraumatic.  Eyes: Conjunctivae are normal. Right eye exhibits no discharge. Left eye exhibits no discharge. No scleral icterus.  Neck: Normal range of motion.  Cardiovascular: Normal rate, regular rhythm and normal heart sounds.  No murmur heard. Respiratory: Effort normal and breath sounds normal. No respiratory distress. She has no wheezes.  GI: Soft. There is no tenderness.  Musculoskeletal: She exhibits no edema.  Neurological: She is alert and oriented to person, place, and time. She has normal reflexes.  No clonus  Skin: Skin is warm and dry. She is not diaphoretic.  Psychiatric: She has a normal mood and affect. Her behavior is normal. Judgment and thought content normal.    MAU Course  Procedures Results for orders placed or performed during the hospital encounter of 10/29/17 (from the past 24 hour(s))  Urinalysis, Routine w reflex microscopic     Status: Abnormal   Collection Time: 10/29/17  9:05 PM  Result Value Ref Range   Color, Urine COLORLESS (A) YELLOW   APPearance CLEAR CLEAR   Specific Gravity, Urine 1.003 (L) 1.005 - 1.030   pH 7.0 5.0 - 8.0   Glucose, UA NEGATIVE NEGATIVE mg/dL   Hgb urine dipstick SMALL (A) NEGATIVE   Bilirubin Urine NEGATIVE NEGATIVE   Ketones, ur NEGATIVE NEGATIVE mg/dL   Protein, ur NEGATIVE NEGATIVE mg/dL   Nitrite  NEGATIVE NEGATIVE   Leukocytes, UA TRACE (A) NEGATIVE   RBC / HPF 0-5 0 - 5 RBC/hpf   WBC, UA 0-5 0 - 5 WBC/hpf   Bacteria, UA NONE SEEN NONE SEEN   Squamous Epithelial / LPF 0-5 (A) NONE SEEN   Mucus PRESENT   Protein / creatinine ratio, urine     Status: None   Collection Time: 10/29/17  9:05 PM  Result Value Ref Range   Creatinine, Urine 16.00  mg/dL   Total Protein, Urine <6.00 mg/dL   Protein Creatinine Ratio        0.00 - 0.15 mg/mg[Cre]  CBC     Status: None   Collection Time: 10/29/17  9:05 PM  Result Value Ref Range   WBC 8.5 4.0 - 10.5 K/uL   RBC 4.19 3.87 - 5.11 MIL/uL   Hemoglobin 12.2 12.0 - 15.0 g/dL   HCT 96.0 45.4 - 09.8 %   MCV 89.5 78.0 - 100.0 fL   MCH 29.1 26.0 - 34.0 pg   MCHC 32.5 30.0 - 36.0 g/dL   RDW 11.9 14.7 - 82.9 %   Platelets 213 150 - 400 K/uL  Comprehensive metabolic panel     Status: Abnormal   Collection Time: 10/29/17  9:05 PM  Result Value Ref Range   Sodium 138 135 - 145 mmol/L   Potassium 3.7 3.5 - 5.1 mmol/L   Chloride 107 101 - 111 mmol/L   CO2 23 22 - 32 mmol/L   Glucose, Bld 104 (H) 65 - 99 mg/dL   BUN 13 6 - 20 mg/dL   Creatinine, Ser 5.62 0.44 - 1.00 mg/dL   Calcium 9.1 8.9 - 13.0 mg/dL   Total Protein 7.2 6.5 - 8.1 g/dL   Albumin 4.0 3.5 - 5.0 g/dL   AST 12 (L) 15 - 41 U/L   ALT 15 14 - 54 U/L   Alkaline Phosphatase 70 38 - 126 U/L   Total Bilirubin 0.5 0.3 - 1.2 mg/dL   GFR calc non Af Amer >60 >60 mL/min   GFR calc Af Amer >60 >60 mL/min   Anion gap 8 5 - 15    MDM Labetalol protocol ordered for severe range BPs --- by time of IV start, no longer severe so labetalol was withheld.  [name] for headache -- no relief Labs normal C/w Dr. Senaida Ores. Will obs over night & adjust medication.   Assessment and Plan  A: 1. Postpartum hypertension   2. Acute nonintractable headache, unspecified headache type    P: Observe overnight Procardia XL 60 mg to start tonight Continue labetalol 200 mg TID Monitor BPs Continue  IV antihypertensive protocol for severe range BPs Ibuprofen 600 mg Q6 hrs prn headache  Judeth Horn 10/29/2017, 9:32 PM   This is Erin's note, I accidentally clicked on it and it then assigned it to me.  I have made no edits to the note.  Lavina Hamman, MD

## 2017-10-30 ENCOUNTER — Other Ambulatory Visit: Payer: Self-pay

## 2017-10-30 DIAGNOSIS — O165 Unspecified maternal hypertension, complicating the puerperium: Secondary | ICD-10-CM | POA: Diagnosis present

## 2017-10-30 LAB — CBC
HCT: 38.7 % (ref 36.0–46.0)
HEMOGLOBIN: 12.3 g/dL (ref 12.0–15.0)
MCH: 28.4 pg (ref 26.0–34.0)
MCHC: 31.8 g/dL (ref 30.0–36.0)
MCV: 89.4 fL (ref 78.0–100.0)
Platelets: 208 10*3/uL (ref 150–400)
RBC: 4.33 MIL/uL (ref 3.87–5.11)
RDW: 13.5 % (ref 11.5–15.5)
WBC: 6.6 10*3/uL (ref 4.0–10.5)

## 2017-10-30 LAB — COMPREHENSIVE METABOLIC PANEL
ALK PHOS: 68 U/L (ref 38–126)
ALT: 14 U/L (ref 14–54)
ANION GAP: 8 (ref 5–15)
AST: 17 U/L (ref 15–41)
Albumin: 4 g/dL (ref 3.5–5.0)
BUN: 7 mg/dL (ref 6–20)
CALCIUM: 9.1 mg/dL (ref 8.9–10.3)
CO2: 23 mmol/L (ref 22–32)
Chloride: 109 mmol/L (ref 101–111)
Creatinine, Ser: 0.6 mg/dL (ref 0.44–1.00)
GFR calc Af Amer: >60 mL/min (ref >60)
GFR calc non Af Amer: >60 mL/min (ref >60)
GLUCOSE: 88 mg/dL (ref 65–99)
Potassium: 3.6 mmol/L (ref 3.5–5.1)
SODIUM: 140 mmol/L (ref 135–145)
Total Bilirubin: 0.5 mg/dL (ref 0.3–1.2)
Total Protein: 7.1 g/dL (ref 6.5–8.1)

## 2017-10-30 LAB — T4, FREE: Free T4: 0.91 ng/dL (ref 0.61–1.12)

## 2017-10-30 LAB — TSH: TSH: 1.281 u[IU]/mL (ref 0.350–4.500)

## 2017-10-30 MED ORDER — BUTALBITAL-APAP-CAFFEINE 50-325-40 MG PO TABS
2.0000 | ORAL_TABLET | Freq: Once | ORAL | Status: AC
  Administered 2017-10-30: 2 via ORAL
  Filled 2017-10-30: qty 2

## 2017-10-30 MED ORDER — NIFEDIPINE ER OSMOTIC RELEASE 30 MG PO TB24
60.0000 mg | ORAL_TABLET | Freq: Two times a day (BID) | ORAL | Status: DC
  Administered 2017-10-30: 60 mg via ORAL
  Filled 2017-10-30: qty 2

## 2017-10-30 MED ORDER — IBUPROFEN 600 MG PO TABS
600.0000 mg | ORAL_TABLET | Freq: Four times a day (QID) | ORAL | Status: DC | PRN
  Administered 2017-10-30 – 2017-10-31 (×3): 600 mg via ORAL
  Filled 2017-10-30 (×3): qty 1

## 2017-10-30 MED ORDER — KETOROLAC TROMETHAMINE 30 MG/ML IJ SOLN
30.0000 mg | Freq: Once | INTRAMUSCULAR | Status: AC
  Administered 2017-10-30: 30 mg via INTRAVENOUS
  Filled 2017-10-30: qty 1

## 2017-10-30 MED ORDER — PRENATAL MULTIVITAMIN CH
1.0000 | ORAL_TABLET | Freq: Every day | ORAL | Status: DC
  Administered 2017-10-30 – 2017-10-31 (×2): 1 via ORAL
  Filled 2017-10-30 (×2): qty 1

## 2017-10-30 MED ORDER — LABETALOL HCL 200 MG PO TABS: 200.0000 mg | ORAL_TABLET | Freq: Three times a day (TID) | ORAL | Status: DC

## 2017-10-30 MED ORDER — BUTALBITAL-APAP-CAFFEINE 50-325-40 MG PO TABS: 2.0000 | ORAL_TABLET | Freq: Once | ORAL | Status: DC

## 2017-10-30 MED ORDER — PANTOPRAZOLE SODIUM 40 MG PO TBEC
40.0000 mg | DELAYED_RELEASE_TABLET | Freq: Every day | ORAL | Status: DC
Start: 2017-10-30 — End: 2017-10-31
  Administered 2017-10-30 – 2017-10-31 (×2): 40 mg via ORAL
  Filled 2017-10-30 (×2): qty 1

## 2017-10-30 MED ORDER — SERTRALINE HCL 50 MG PO TABS
50.0000 mg | ORAL_TABLET | Freq: Every day | ORAL | Status: DC
Start: 2017-10-30 — End: 2017-10-31
  Administered 2017-10-30: 50 mg via ORAL
  Filled 2017-10-30 (×2): qty 1

## 2017-10-30 MED ORDER — FAMOTIDINE IN NACL 20-0.9 MG/50ML-% IV SOLN
20.0000 mg | Freq: Once | INTRAVENOUS | Status: AC
  Administered 2017-10-30: 20 mg via INTRAVENOUS
  Filled 2017-10-30: qty 50

## 2017-10-30 MED ORDER — OXYCODONE HCL 5 MG PO TABS
5.0000 mg | ORAL_TABLET | Freq: Once | ORAL | Status: AC
  Administered 2017-10-30: 5 mg via ORAL
  Filled 2017-10-30: qty 1

## 2017-10-30 MED ORDER — ONDANSETRON HCL 4 MG/2ML IJ SOLN
4.0000 mg | Freq: Once | INTRAMUSCULAR | Status: AC
  Administered 2017-10-30: 4 mg via INTRAVENOUS
  Filled 2017-10-30: qty 2

## 2017-10-30 MED ORDER — NIFEDIPINE ER OSMOTIC RELEASE 30 MG PO TB24
60.0000 mg | ORAL_TABLET | Freq: Every day | ORAL | Status: DC
  Administered 2017-10-30: 60 mg via ORAL
  Filled 2017-10-30 (×2): qty 2

## 2017-10-30 NOTE — H&P (Signed)
Christine Beard is an 27 y.o. female G2P2 s/p NSVD 10/06/17 with ongoing issues with elevated blood pressures postpartum and intermittent HA's.  Patient has been closely followed outpatient and seen in [facility] 10/14/17 for this complaint.  Blood work and urine studies have been normal with no evidence of preeclampsia, and patient was started on labetalol 100mg  po BID that has been steadily increased to 200mg  po TID but did not control the BP.   Yesterday she reported an increase in her HA and also has had some N/V for the last 2 days.  BP on admission was in severe range 160-170/90's despite her labetalol, so started on procardia XL  60mg  po (got first dose at 0130am and some IV labetalol as well).  BP this AM was 100/46.  She had an episode of N/V this AM and reported her HA was still present.  Pregnancy was significant for depression, hypothyroidism and IUGR.  She has not been on her zoloft or thyroid medications for at least a couple of months due to not having money to get them or a PMD.   Last TSH was will 10/18.  She feels she needs the zoloft as stays anxious and having care of newborn and 27 y/o is hard.  She has had a pp BTL.   Pertinent Gynecological History: OB History:  NSVD x 2   Menstrual History:  No LMP recorded.    Past Medical History:  Diagnosis Date  . Anxiety   . Depression   . Headache   . Hypothyroidism   . S/P tubal ligation 10/06/2017  . SVD (spontaneous vaginal delivery) 10/05/2017  . Thyroid disease     Past Surgical History:  Procedure Laterality Date  . TOE SURGERY    . TONSILLECTOMY    . TUBAL LIGATION N/A 10/06/2017   Procedure: POST PARTUM TUBAL LIGATION;  Surgeon: Sherian Rein, MD;  Location: [facility];  Service: Gynecology;  Laterality: N/A;    Family History  Problem Relation Age of Onset  . Diabetes Maternal Grandmother   . Diabetes Paternal Grandmother     Social History:  reports that she quit smoking about 3 years ago. she has never  used smokeless tobacco. She reports that she does not drink alcohol or use drugs.  Allergies:  Allergies  Allergen Reactions  . Cinnamon Itching  . Codeine Nausea And Vomiting    Medications Prior to Admission  Medication Sig Dispense Refill Last Dose  . acetaminophen (TYLENOL) 500 MG tablet Take 1,000 mg by mouth every 6 (six) hours as needed for mild pain or headache.    10/29/2017  . ibuprofen (ADVIL,MOTRIN) 600 MG tablet Take 1 tablet (600 mg total) by mouth every 6 (six) hours. 30 tablet 0 10/29/2017 at Unknown time  . labetalol (NORMODYNE) 100 MG tablet Take 1 tablet (100 mg total) by mouth 2 (two) times daily. 30 tablet 0 10/30/2017 at 2000  . levothyroxine (SYNTHROID, LEVOTHROID) 50 MCG tablet levothyroxine 50 mcg tablet  take 1 tablet by mouth once daily   Past Month at Unknown time  . oxyCODONE (OXY IR/ROXICODONE) 5 MG immediate release tablet Take 1 tablet (5 mg total) by mouth every 4 (four) hours as needed for severe pain. 10 tablet 0 Past Month at Unknown time  . Prenatal Vit-Fe Fumarate-FA (PRENATAL MULTIVITAMIN) TABS tablet Take 1 tablet by mouth daily at 12 noon.   Past Month at Unknown time  . promethazine (PHENERGAN) 12.5 MG tablet Take 1 tablet (12.5 mg total) by  mouth every 6 (six) hours as needed for nausea or vomiting. 30 tablet 0 Past Month at Unknown time  . ranitidine (ZANTAC) 150 MG tablet Take 1 tablet (150 mg total) by mouth 2 (two) times daily. 60 tablet 1 Past Month at Unknown time  . sertraline (ZOLOFT) 50 MG tablet Take 50 mg by mouth daily.   Past Month at Unknown time    Review of Systems  Gastrointestinal: Positive for nausea and vomiting.  Neurological: Positive for dizziness and headaches.    Blood pressure (!) 100/46, pulse 68, temperature 98.9 F (37.2 C), temperature source Oral, resp. rate 16, height 4' 7.5" (1.41 m), weight 52.6 kg (116 lb), SpO2 100 %, unknown if currently breastfeeding. Physical Exam  Constitutional: She appears well-developed.   Cardiovascular: Normal rate and regular rhythm.  Respiratory: Effort normal.  GI: Soft.  Genitourinary: Vagina normal and uterus normal.  Neurological: She is alert.    Results for orders placed or performed during the hospital encounter of 10/29/17 (from the past 24 hour(s))  Urinalysis, Routine w reflex microscopic     Status: Abnormal   Collection Time: 10/29/17  9:05 PM  Result Value Ref Range   Color, Urine COLORLESS (A) YELLOW   APPearance CLEAR CLEAR   Specific Gravity, Urine 1.003 (L) 1.005 - 1.030   pH 7.0 5.0 - 8.0   Glucose, UA NEGATIVE NEGATIVE mg/dL   Hgb urine dipstick SMALL (A) NEGATIVE   Bilirubin Urine NEGATIVE NEGATIVE   Ketones, ur NEGATIVE NEGATIVE mg/dL   Protein, ur NEGATIVE NEGATIVE mg/dL   Nitrite NEGATIVE NEGATIVE   Leukocytes, UA TRACE (A) NEGATIVE   RBC / HPF 0-5 0 - 5 RBC/hpf   WBC, UA 0-5 0 - 5 WBC/hpf   Bacteria, UA NONE SEEN NONE SEEN   Squamous Epithelial / LPF 0-5 (A) NONE SEEN   Mucus PRESENT   Protein / creatinine ratio, urine     Status: None   Collection Time: 10/29/17  9:05 PM  Result Value Ref Range   Creatinine, Urine 16.00 mg/dL   Total Protein, Urine <6.00 mg/dL   Protein Creatinine Ratio        0.00 - 0.15 mg/mg[Cre]  CBC     Status: None   Collection Time: 10/29/17  9:05 PM  Result Value Ref Range   WBC 8.5 4.0 - 10.5 K/uL   RBC 4.19 3.87 - 5.11 MIL/uL   Hemoglobin 12.2 12.0 - 15.0 g/dL   HCT 16.1 09.6 - 04.5 %   MCV 89.5 78.0 - 100.0 fL   MCH 29.1 26.0 - 34.0 pg   MCHC 32.5 30.0 - 36.0 g/dL   RDW 40.9 81.1 - 91.4 %   Platelets 213 150 - 400 K/uL  Comprehensive metabolic panel     Status: Abnormal   Collection Time: 10/29/17  9:05 PM  Result Value Ref Range   Sodium 138 135 - 145 mmol/L   Potassium 3.7 3.5 - 5.1 mmol/L   Chloride 107 101 - 111 mmol/L   CO2 23 22 - 32 mmol/L   Glucose, Bld 104 (H) 65 - 99 mg/dL   BUN 13 6 - 20 mg/dL   Creatinine, Ser 7.82 0.44 - 1.00 mg/dL   Calcium 9.1 8.9 - 95.6 mg/dL   Total  Protein 7.2 6.5 - 8.1 g/dL   Albumin 4.0 3.5 - 5.0 g/dL   AST 12 (L) 15 - 41 U/L   ALT 15 14 - 54 U/L   Alkaline Phosphatase 70 38 - 126  U/L   Total Bilirubin 0.5 0.3 - 1.2 mg/dL   GFR calc non Af Amer >60 >60 mL/min   GFR calc Af Amer >60 >60 mL/min   Anion gap 8 5 - 15    No results found.  Assessment/Plan: Pt admitted for postpartum hypertension 3 weeks postpartum, failing labetalol 200mg  po TID.  Pt also c/o intermittent HA and n/v for past few days. Labs WNL last PM, urine without significant proteinuria.  Pt states HA currently improved s/p IV toradol (given since was having N/V). D/w her if continues to have severe HA, magnesium will be indicated.   Will d/c labetalol and change BP medication to procardia XL 60mg  po BID to simplify regimen and since BP low this AM on dual therapy.  N/V currently improved, pt would like to try lunch in a bit.  Will see how that goes.  Restart zoloft and check thyroid labs.  Pt has been only on low dose synthroid 50mg  off and on for years, will see how running off meds before restarting.      Oliver PilaKathy W Jaliyah Beard 10/30/2017, 9:59 AM

## 2017-10-30 NOTE — Progress Notes (Signed)
1318: gave pt 2 tablets of fioricet for H/A. Rating pain 8/10. 1500: Re-assessed pain and pt states she has no relief from fioricet. BP 124/66. 15:10: MD notified. Ordered 5 mg oxy IR. Told to give and re-assess pain in 2 hours. If pt has no relief from H/A, told to call back. Gave pt pain medicine. Encouraged pt to drink some fluids and get some rest. Will continue to monitor.

## 2017-10-30 NOTE — Progress Notes (Signed)
Patient ID: [name], female   DOB: 1991/05/14, 27 y.o.   MRN: [id number] Pt has maintained a headache throughout the day, but BP has responded very well to the procardia and rest.  100-130/60-70 HA does not appear to be related  to BP or preeclampsia.  Pt with h/o migraines, and has not slept well in 2 days. Her N/V has resolved and tolerated pizza for lunch. Will add back ibuprofen to her regimen and continue to follow BP closely.  Hesitant to put patient on magnesium since etiology of HA does not appear related to preeclampsia.   Coninue to follow BP, next dose of procardia due at 2000.

## 2017-10-31 MED ORDER — METOCLOPRAMIDE HCL 10 MG PO TABS
10.0000 mg | ORAL_TABLET | Freq: Four times a day (QID) | ORAL | Status: DC | PRN
  Filled 2017-10-31: qty 1

## 2017-10-31 MED ORDER — NIFEDIPINE ER OSMOTIC RELEASE 30 MG PO TB24
60.0000 mg | ORAL_TABLET | Freq: Every day | ORAL | Status: DC
  Administered 2017-10-31: 60 mg via ORAL
  Filled 2017-10-31: qty 2

## 2017-10-31 MED ORDER — NIFEDIPINE ER 60 MG PO TB24: 60.0000 mg | ORAL_TABLET | Freq: Every day | ORAL | 1 refills | Status: DC

## 2017-10-31 MED ORDER — METOCLOPRAMIDE HCL 5 MG/ML IJ SOLN
10.0000 mg | Freq: Four times a day (QID) | INTRAMUSCULAR | Status: DC | PRN
  Administered 2017-10-31: 10 mg via INTRAVENOUS
  Filled 2017-10-31: qty 2

## 2017-10-31 MED ORDER — BUTALBITAL-APAP-CAFFEINE 50-325-40 MG PO TABS: 1.0000 | ORAL_TABLET | ORAL | 0 refills | Status: DC | PRN

## 2017-10-31 MED ORDER — METOCLOPRAMIDE HCL 10 MG PO TABS: 10.0000 mg | ORAL_TABLET | Freq: Four times a day (QID) | ORAL | 1 refills | Status: DC | PRN

## 2017-10-31 MED ORDER — BUTALBITAL-APAP-CAFFEINE 50-325-40 MG PO TABS
1.0000 | ORAL_TABLET | ORAL | Status: DC | PRN
  Administered 2017-10-31: 1 via ORAL
  Filled 2017-10-31 (×3): qty 1

## 2017-10-31 NOTE — Progress Notes (Signed)
Entered room to give patient [name] and Reglan for her HA and she was vomiting.  She does not think she can hold down PO medication right now.  Phoned Dr. Jackelyn KnifeMeisinger to request IV antiemetic.  He stated he would change Reglan to IV route as it might help with both nausea and HA.

## 2017-10-31 NOTE — Progress Notes (Signed)
HD #2, s/p SVD 1-3, postpartum HTN and headache Feels ok except still has headache, rates 7/10 this am, says nothing has helped much so far Afeb, VSS, BP low  Will reduce Procardia XL 60 mg to daily from bid since BP is low.  Will try [name] q 4 hrs and Reglan q 6 hrs prn and see how headache does this morning

## 2017-10-31 NOTE — Progress Notes (Signed)
Per patient's request I called  Dr Jackelyn KnifeMeisinger to give him her BP readings.  He said she can be discharged home.

## 2017-10-31 NOTE — Progress Notes (Signed)
Didn't like the way IV Reglan made her feel, but nausea and headache improved, HA currently 4/10 Afeb, VSS, BP normal Will send home if does ok with lunch, has f/u later this week

## 2017-10-31 NOTE — Discharge Instructions (Signed)
Call for increasing headache, nausea or vomiting

## 2017-11-02 NOTE — Discharge Summary (Signed)
Physician Discharge Summary  Patient ID: Christine Beard MRN: [id number] DOB/AGE: [birth year] 27 y.o.  Admit date: 10/29/2017 Discharge date: 10/31/2017  Admission Diagnoses:  Postpartum hypertension and headache  Discharge Diagnoses: Same Active Problems:   Postpartum hypertension   Discharged Condition: good  Hospital Course: Pt admitted by Dr. Senaida Oresichardson for mild hypertension and persistent headache, s/p SVD on 1-3 with previous admission for postpartum preeclampsia.  Labs normal, no evidence of preeclampsia with this admission.  BP controlled with PO Labetalol and Procardia, but headache did not initially improve much even with normal BP.  She continued to have daily headaches with emesis in the morning.  She was given IV Reglan on 1-30 after emesis, this significantly improved her headache and nausea.  By lunchtime her BP was stable and headache was improved enough for discharge.   Discharge Exam: Blood pressure (!) 143/80, pulse (!) 102, temperature 98.1 F (36.7 C), temperature source Oral, resp. rate 16, height 4' 7.5" (1.41 m), weight 116 lb (52.6 kg), SpO2 100 %, unknown if currently breastfeeding. General appearance: alert  Disposition: 01-Home or Self Care  Discharge Instructions    Diet - low sodium heart healthy   Complete by:  As directed      Allergies as of 10/31/2017      Reactions   Cinnamon Itching   Codeine Nausea And Vomiting      Medication List    STOP taking these medications   labetalol 100 MG tablet Commonly known as:  NORMODYNE   promethazine 12.5 MG tablet Commonly known as:  PHENERGAN     TAKE these medications   acetaminophen 500 MG tablet Commonly known as:  TYLENOL Take 1,000 mg by mouth every 6 (six) hours as needed for mild pain or headache.   butalbital-acetaminophen-caffeine 50-325-40 MG tablet Commonly known as:  [name], ESGIC Take 1 tablet by mouth every 4 (four) hours as needed for headache.   ibuprofen 600 MG  tablet Commonly known as:  ADVIL,MOTRIN Take 1 tablet (600 mg total) by mouth every 6 (six) hours.   levothyroxine 50 MCG tablet Commonly known as:  SYNTHROID, LEVOTHROID levothyroxine 50 mcg tablet  take 1 tablet by mouth once daily   metoCLOPramide 10 MG tablet Commonly known as:  REGLAN Take 1 tablet (10 mg total) by mouth every 6 (six) hours as needed (headache, ok to give with or instead of [name] at patient discretion).   NIFEdipine 60 MG 24 hr tablet Commonly known as:  PROCARDIA-XL/ADALAT CC Take 1 tablet (60 mg total) by mouth daily.   oxyCODONE 5 MG immediate release tablet Commonly known as:  Oxy IR/ROXICODONE Take 1 tablet (5 mg total) by mouth every 4 (four) hours as needed for severe pain.   prenatal multivitamin Tabs tablet Take 1 tablet by mouth daily at 12 noon.   ranitidine 150 MG tablet Commonly known as:  ZANTAC Take 1 tablet (150 mg total) by mouth 2 (two) times daily.   sertraline 50 MG tablet Commonly known as:  ZOLOFT Take 50 mg by mouth daily.      Follow-up Information    Huel Coteichardson, Kathy, MD. Go in 4 day(s).   Specialty:  Obstetrics and Gynecology Why:  has appt already scheduled Contact information: [address] [place] [address] [phone]           Signed: Leighton Roachodd D Kera Deacon 11/02/2017, 7:18 AM

## 2018-09-20 ENCOUNTER — Emergency Department (HOSPITAL_BASED_OUTPATIENT_CLINIC_OR_DEPARTMENT_OTHER)
Admission: EM | Admit: 2018-09-20 | Discharge: 2018-09-20 | Disposition: A | Discharge status: Home / Self Care | Payer: Medicaid Other | Attending: Emergency Medicine | Admitting: Emergency Medicine

## 2018-09-20 ENCOUNTER — Encounter (HOSPITAL_BASED_OUTPATIENT_CLINIC_OR_DEPARTMENT_OTHER): Payer: Self-pay | Admitting: *Deleted

## 2018-09-20 DIAGNOSIS — F419 Anxiety disorder, unspecified: Secondary | ICD-10-CM | POA: Insufficient documentation

## 2018-09-20 DIAGNOSIS — F329 Major depressive disorder, single episode, unspecified: Secondary | ICD-10-CM | POA: Insufficient documentation

## 2018-09-20 DIAGNOSIS — Y929 Unspecified place or not applicable: Secondary | ICD-10-CM | POA: Insufficient documentation

## 2018-09-20 DIAGNOSIS — S00211A Abrasion of right eyelid and periocular area, initial encounter: Secondary | ICD-10-CM | POA: Insufficient documentation

## 2018-09-20 DIAGNOSIS — S0591XA Unspecified injury of right eye and orbit, initial encounter: Secondary | ICD-10-CM

## 2018-09-20 DIAGNOSIS — Z79899 Other long term (current) drug therapy: Secondary | ICD-10-CM | POA: Insufficient documentation

## 2018-09-20 DIAGNOSIS — W268XXA Contact with other sharp object(s), not elsewhere classified, initial encounter: Secondary | ICD-10-CM | POA: Insufficient documentation

## 2018-09-20 DIAGNOSIS — Y9389 Activity, other specified: Secondary | ICD-10-CM | POA: Diagnosis not present

## 2018-09-20 DIAGNOSIS — Y998 Other external cause status: Secondary | ICD-10-CM | POA: Insufficient documentation

## 2018-09-20 DIAGNOSIS — Z87891 Personal history of nicotine dependence: Secondary | ICD-10-CM | POA: Insufficient documentation

## 2018-09-20 DIAGNOSIS — E039 Hypothyroidism, unspecified: Secondary | ICD-10-CM | POA: Diagnosis not present

## 2018-09-20 MED ORDER — FLUORESCEIN SODIUM 1 MG OP STRP
ORAL_STRIP | OPHTHALMIC | Status: AC
  Filled 2018-09-20: qty 1

## 2018-09-20 MED ORDER — OXYCODONE-ACETAMINOPHEN 5-325 MG PO TABS: 1.0000 | ORAL_TABLET | ORAL | 0 refills | Status: DC | PRN

## 2018-09-20 MED ORDER — TETRACAINE HCL 0.5 % OP SOLN
OPHTHALMIC | Status: AC
  Filled 2018-09-20: qty 4

## 2018-09-20 MED ORDER — OXYCODONE-ACETAMINOPHEN 5-325 MG PO TABS
1.0000 | ORAL_TABLET | Freq: Once | ORAL | Status: AC
  Administered 2018-09-20: 1 via ORAL
  Filled 2018-09-20: qty 1

## 2018-09-20 MED ORDER — ERYTHROMYCIN 5 MG/GM OP OINT: TOPICAL_OINTMENT | OPHTHALMIC | 0 refills | Status: DC

## 2018-09-20 NOTE — ED Provider Notes (Signed)
[facility] EMERGENCY DEPARTMENT Provider Note   CSN: [id number] Arrival date & time: 09/20/18  0004     History   Chief Complaint Chief Complaint  Patient presents with  . Eye Injury    HPI Christine Beard is a 27 y.o. female.  The history is provided by the patient, the spouse and medical records. No language interpreter was used.  Eye Problem   This is a new problem. The current episode started less than 1 hour ago. The problem occurs constantly. The problem has not changed since onset.There is a problem in the right eye. The injury mechanism was a direct trauma. The pain is severe. There is history of trauma to the eye. There is no known exposure to pink eye. She does not wear contacts. Pertinent negatives include no numbness, no blurred vision, no decreased vision, no discharge, no double vision, no foreign body sensation, no photophobia, no eye redness, no nausea, no vomiting, no tingling and no weakness. She has tried nothing for the symptoms. The treatment provided no relief.    Past Medical History:  Diagnosis Date  . Anxiety   . Depression   . Headache   . Hypothyroidism   . S/P tubal ligation 10/06/2017  . SVD (spontaneous vaginal delivery) 10/05/2017  . Thyroid disease     Patient Active Problem List   Diagnosis Date Noted  . Postpartum hypertension 10/30/2017  . S/P tubal ligation 10/06/2017  . SVD (spontaneous vaginal delivery) 10/05/2017  . Labor and delivery indication for care or intervention 10/04/2017  . Preeclampsia in postpartum period 06/07/2015  . Intrauterine growth restriction affecting antepartum care of mother 05/29/2015  . PROM (premature rupture of membranes) 05/27/2015  . NAUSEA AND VOMITING 05/26/2009  . ABDOMINAL PAIN, LUQ 05/26/2009    Past Surgical History:  Procedure Laterality Date  . TOE SURGERY    . TONSILLECTOMY    . TUBAL LIGATION N/A 10/06/2017   Procedure: POST PARTUM TUBAL LIGATION;  Surgeon: Sherian ReinBovard-Stuckert,  Jody, MD;  Location: [facility];  Service: Gynecology;  Laterality: N/A;     OB History    Gravida  2   Para  2   Term  2   Preterm      AB      Living  2     SAB      TAB      Ectopic      Multiple  0   Live Births  2            Home Medications    Prior to Admission medications   Medication Sig Start Date End Date Taking? Authorizing Provider  acetaminophen (TYLENOL) 500 MG tablet Take 1,000 mg by mouth every 6 (six) hours as needed for mild pain or headache.     [provider]  butalbital-acetaminophen-caffeine ([name], ESGIC) [id number] MG tablet Take 1 tablet by mouth every 4 (four) hours as needed for headache. 10/31/17   Meisinger, Tawanna Coolerodd, MD  ibuprofen (ADVIL,MOTRIN) 600 MG tablet Take 1 tablet (600 mg total) by mouth every 6 (six) hours. 10/07/17   Meisinger, Tawanna Coolerodd, MD  levothyroxine (SYNTHROID, LEVOTHROID) 50 MCG tablet levothyroxine 50 mcg tablet  take 1 tablet by mouth once daily    [provider]  metoCLOPramide (REGLAN) 10 MG tablet Take 1 tablet (10 mg total) by mouth every 6 (six) hours as needed (headache, ok to give with or instead of [name] at patient discretion). 10/31/17   Meisinger, Tawanna Coolerodd, MD  NIFEdipine (  PROCARDIA-XL/ADALAT CC) 60 MG 24 hr tablet Take 1 tablet (60 mg total) by mouth daily. 11/01/17   Meisinger, Tawanna Cooler, MD  oxyCODONE (OXY IR/ROXICODONE) 5 MG immediate release tablet Take 1 tablet (5 mg total) by mouth every 4 (four) hours as needed for severe pain. 10/07/17   Meisinger, Tawanna Cooler, MD  Prenatal Vit-Fe Fumarate-FA (PRENATAL MULTIVITAMIN) TABS tablet Take 1 tablet by mouth daily at 12 noon.    [provider]  ranitidine (ZANTAC) 150 MG tablet Take 1 tablet (150 mg total) by mouth 2 (two) times daily. 05/04/17   Marny Lowenstein, PA-C  sertraline (ZOLOFT) 50 MG tablet Take 50 mg by mouth daily.    [provider]    Family History Family History  Problem Relation Age of Onset  . Diabetes Maternal  Grandmother   . Diabetes Paternal Grandmother     Social History Social History   Tobacco Use  . Smoking status: Former Smoker    Last attempt to quit: 09/03/2014    Years since quitting: 4.0  . Smokeless tobacco: Never Used  Substance Use Topics  . Alcohol use: No  . Drug use: No     Allergies   Cinnamon and Codeine   Review of Systems Review of Systems  Constitutional: Negative for chills, diaphoresis, fatigue and fever.  HENT: Negative for congestion.   Eyes: Positive for visual disturbance (mild blurry vision, improved). Negative for blurred vision, double vision, photophobia, discharge and redness.  Respiratory: Negative for chest tightness and shortness of breath.   Cardiovascular: Negative for chest pain.  Gastrointestinal: Negative for abdominal pain, nausea and vomiting.  Genitourinary: Negative for dysuria.  Musculoskeletal: Negative for back pain.  Neurological: Negative for tingling, weakness, light-headedness, numbness and headaches.  Psychiatric/Behavioral: Negative for agitation.  All other systems reviewed and are negative.    Physical Exam Updated Vital Signs BP (!) 143/90 (BP Location: Left Arm)   Pulse (!) 105   Temp 98.7 F (37.1 C) (Oral)   Resp 16   Ht 4' 7.5" (1.41 m)   Wt 48.5 kg   LMP 09/20/2018   SpO2 100%   BMI 24.42 kg/m   Physical Exam Vitals signs and nursing note reviewed.  Constitutional:      General: She is not in acute distress.    Appearance: She is well-developed. She is not diaphoretic.  HENT:     Head: Normocephalic. Abrasion present.     Nose: Nose normal. No congestion or rhinorrhea.     Mouth/Throat:     Mouth: Mucous membranes are moist.     Pharynx: No oropharyngeal exudate.  Eyes:     General:        Right eye: No foreign body or discharge.     Intraocular pressure: Right eye pressure is 18 mmHg. Measurements were taken using a handheld tonometer.    Extraocular Movements: Extraocular movements intact.      Right eye: Normal extraocular motion and no nystagmus.     Left eye: Normal extraocular motion and no nystagmus.     Conjunctiva/sclera: Conjunctivae normal.     Right eye: No exudate or hemorrhage.    Pupils: Pupils are equal, round, and reactive to light. Pupils are equal.     Right eye: Pupil is not sluggish.     Slit lamp exam:    Right eye: No corneal ulcer, foreign body, hyphema or hypopyon.     Visual Fields: Right eye visual fields normal and left eye visual fields normal.  Comments: Visual fields were normal.  Pupils symmetric and reactive bilaterally.  No consensual photophobia.  Normal extraocular movements.  Small abrasion/laceration to her right lateral upper eyelid.  Hemostatic.  Does not appear to go completely through eyelid.  Normal blinking.  Fluoresein stain shows no significant abrasion.  No Seidel sign.   Pressure was 18 and normal  Neck:     Musculoskeletal: Normal range of motion and neck supple. No neck rigidity.  Pulmonary:     Effort: No respiratory distress.     Breath sounds: No stridor.  Abdominal:     General: There is no distension.     Tenderness: There is no abdominal tenderness. There is no rebound.  Skin:    General: Skin is warm.     Findings: No erythema or rash.  Neurological:     Mental Status: She is alert and oriented to person, place, and time.     Motor: No abnormal muscle tone.     Coordination: Coordination normal.     Deep Tendon Reflexes: Reflexes are normal and symmetric.      ED Treatments / Results  Labs (all labs ordered are listed, but only abnormal results are displayed) Labs Reviewed - No data to display  EKG None  Radiology No results found.  Procedures Procedures (including critical care time)  Medications Ordered in ED Medications  fluorescein 1 MG ophthalmic strip (has no administration in time range)  tetracaine (PONTOCAINE) 0.5 % ophthalmic solution (has no administration in time range)    oxyCODONE-acetaminophen (PERCOCET/ROXICET) 5-325 MG per tablet 1 tablet (1 tablet Oral Given 09/20/18 0109)     Initial Impression / Assessment and Plan / ED Course  I have reviewed the triage vital signs and the nursing notes.  Pertinent labs & imaging results that were available during my care of the patient were reviewed by me and considered in my medical decision making (see chart for details).     Christine Beard is a 27 y.o. female with a past medical history significant for anxiety, thyroid disease, and depression who presents with right eye injury.  Patient reports that she was crocheting when her infant daughter bumped it hitting her right eye.  She reports some bleeding from her upper eyelid and severe eye pain.  She is never injured this eye before.  She denies any diplopia.  She does report some mild blurry vision that comes and goes.  She denies any other injuries.  Pain is not worsened with eye movement.  On exam, patient has a 1 to 2 mm laceration/abrasion to her upper eyelid.  It is hemostatic.  There is dried blood.  Patient had full range of motion of her eye.  Patient's pupil is round and normal-appearing.  Fluoresein was utilized with tetracaine drops and patient had improvement with pain.  No evidence of uptake or corneal abrasion was seen.  Slit lamp was also used with no evidence of corneal injury seen.  Patient will have pressure checked with Tono-Pen.  If pressures reassuring, anticipate discharge with prescription for antibiotic ointment to prevent infection from her small abrasion on her eyelid and will follow-up with ophthalmology.  Eye pressure was checked and it was 18 and normal.    Patient will give prescription for Romycin eye ointment due to the abrasion/laceration on her eyelid and the eye pain.  No evidence of corneal injury at this time.  Patient will follow-up with ophthalmology and begin prescription for pain medication.  Due to reported nausea  with  codeine, she was given a Percocet emergency department and was able to tolerate it.  Patient was able to eat and drink.  Patient will follow-up with ophthalmology and understood return precautions.  Patient discharged in good condition.    Final Clinical Impressions(s) / ED Diagnoses   Final diagnoses:  Eyelid abrasion, right, initial encounter  Right eye injury, initial encounter    ED Discharge Orders         Ordered    erythromycin ophthalmic ointment     09/20/18 0152    oxyCODONE-acetaminophen (PERCOCET/ROXICET) 5-325 MG tablet  Every 4 hours PRN     09/20/18 0152         Clinical Impression: 1. Eyelid abrasion, right, initial encounter   2. Right eye injury, initial encounter     Disposition: Discharge  Condition: Good  I have discussed the results, Dx and Tx plan with the pt(& family if present). He/she/they expressed understanding and agree(s) with the plan. Discharge instructions discussed at great length. Strict return precautions discussed and pt &/or family have verbalized understanding of the instructions. No further questions at time of discharge.    New Prescriptions   ERYTHROMYCIN OPHTHALMIC OINTMENT    Place a 1/2 inch ribbon of ointment into the lower eyelid up to 4 times a day.   OXYCODONE-ACETAMINOPHEN (PERCOCET/ROXICET) 5-325 MG TABLET    Take 1 tablet by mouth every 4 (four) hours as needed for severe pain.    Follow Up: Marcelline Deist, MD [address] [place] [address] [phone]  Call       Tegeler, Canary Brim, MD 09/20/18 0157

## 2018-09-20 NOTE — Discharge Instructions (Signed)
Your exam today showed no evidence of injury to the eyeball.  There was no evidence of laceration to the cornea, corneal abrasion, corneal ulcer, or traumatic perforation of the eye.  We did find evidence of an abrasion/laceration to the eyelid.  As it is well approximated and hemostatic, we did not feel that suturing would be beneficial at this time.  Please use the antibiotic ointment to help prevent infection and please follow-up with the ophthalmology team in the next 24 to 48 hours.  Please call their number to schedule appointment for eye injury evaluation.  Please use the pain medicine to help with your symptoms.  If any symptoms change or worsen, please return to the nearest emergency department.
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
## 2018-10-27 ENCOUNTER — Inpatient Hospital Stay (HOSPITAL_COMMUNITY)
Admission: RE | Admit: 2018-10-27 | Discharge: 2018-11-04 | DRG: 885 | Disposition: A | Discharge status: Home / Self Care | Payer: Medicaid Other | Attending: Psychiatry | Admitting: Psychiatry

## 2018-10-27 DIAGNOSIS — G4710 Hypersomnia, unspecified: Secondary | ICD-10-CM | POA: Diagnosis present

## 2018-10-27 DIAGNOSIS — Z885 Allergy status to narcotic agent status: Secondary | ICD-10-CM | POA: Diagnosis not present

## 2018-10-27 DIAGNOSIS — Z87891 Personal history of nicotine dependence: Secondary | ICD-10-CM

## 2018-10-27 DIAGNOSIS — R55 Syncope and collapse: Secondary | ICD-10-CM | POA: Diagnosis present

## 2018-10-27 DIAGNOSIS — R45851 Suicidal ideations: Secondary | ICD-10-CM | POA: Diagnosis present

## 2018-10-27 DIAGNOSIS — Z79899 Other long term (current) drug therapy: Secondary | ICD-10-CM

## 2018-10-27 DIAGNOSIS — Z62810 Personal history of physical and sexual abuse in childhood: Secondary | ICD-10-CM | POA: Diagnosis present

## 2018-10-27 DIAGNOSIS — Z62811 Personal history of psychological abuse in childhood: Secondary | ICD-10-CM | POA: Diagnosis not present

## 2018-10-27 DIAGNOSIS — G4700 Insomnia, unspecified: Secondary | ICD-10-CM | POA: Diagnosis not present

## 2018-10-27 DIAGNOSIS — F419 Anxiety disorder, unspecified: Secondary | ICD-10-CM | POA: Diagnosis not present

## 2018-10-27 DIAGNOSIS — W1800XA Striking against unspecified object with subsequent fall, initial encounter: Secondary | ICD-10-CM

## 2018-10-27 DIAGNOSIS — F410 Panic disorder [episodic paroxysmal anxiety] without agoraphobia: Secondary | ICD-10-CM | POA: Diagnosis present

## 2018-10-27 DIAGNOSIS — Z79890 Hormone replacement therapy (postmenopausal): Secondary | ICD-10-CM

## 2018-10-27 DIAGNOSIS — F332 Major depressive disorder, recurrent severe without psychotic features: Secondary | ICD-10-CM | POA: Diagnosis present

## 2018-10-27 DIAGNOSIS — E039 Hypothyroidism, unspecified: Secondary | ICD-10-CM | POA: Diagnosis present

## 2018-10-27 DIAGNOSIS — Z915 Personal history of self-harm: Secondary | ICD-10-CM | POA: Diagnosis not present

## 2018-10-27 MED ORDER — ACETAMINOPHEN 325 MG PO TABS
650.0000 mg | ORAL_TABLET | Freq: Four times a day (QID) | ORAL | Status: DC | PRN
  Administered 2018-10-27 – 2018-11-04 (×19): 650 mg via ORAL
  Filled 2018-10-27 (×20): qty 2

## 2018-10-27 MED ORDER — TRAZODONE HCL 50 MG PO TABS
50.0000 mg | ORAL_TABLET | Freq: Every evening | ORAL | Status: DC | PRN
  Filled 2018-10-27: qty 1

## 2018-10-27 MED ORDER — HYDROXYZINE HCL 25 MG PO TABS
25.0000 mg | ORAL_TABLET | Freq: Three times a day (TID) | ORAL | Status: DC | PRN
  Administered 2018-10-27 – 2018-11-02 (×11): 25 mg via ORAL
  Filled 2018-10-27: qty 10
  Filled 2018-10-27 (×11): qty 1

## 2018-10-27 MED ORDER — MAGNESIUM HYDROXIDE 400 MG/5ML PO SUSP: 30.0000 mL | Freq: Every day | ORAL | Status: DC | PRN

## 2018-10-27 MED ORDER — ALUM & MAG HYDROXIDE-SIMETH 200-200-20 MG/5ML PO SUSP: 30.0000 mL | ORAL | Status: DC | PRN

## 2018-10-27 NOTE — H&P (Signed)
Behavioral Health Medical Screening Exam  Christine Beard is an 28 y.o. female.  Total Time spent with patient: 15 minutes  Psychiatric Specialty Exam: Physical Exam  Constitutional: She is oriented to person, place, and time. She appears well-developed and well-nourished. No distress.  HENT:  Head: Normocephalic and atraumatic.  Right Ear: External ear normal.  Left Ear: External ear normal.  Eyes: Pupils are equal, round, and reactive to light.  Respiratory: Effort normal. No respiratory distress.  Musculoskeletal: Normal range of motion.  Neurological: She is alert and oriented to person, place, and time.  Skin: She is not diaphoretic.  Psychiatric: Her mood appears anxious. Her affect is blunt. She is not withdrawn and not actively hallucinating. Thought content is not paranoid and not delusional. She exhibits a depressed mood. She expresses suicidal ideation. She expresses no homicidal ideation. She expresses no suicidal plans.    Review of Systems  Constitutional: Negative for chills, fever and weight loss.  Neurological: Positive for headaches.  Psychiatric/Behavioral: Positive for depression and suicidal ideas. Negative for hallucinations, memory loss and substance abuse. The patient is nervous/anxious and has insomnia.   All other systems reviewed and are negative.   unknown if currently breastfeeding.There is no height or weight on file to calculate BMI.  General Appearance: Casual and Fairly Groomed  Eye Contact:  Fair  Speech:  Clear and Coherent and Normal Rate  Volume:  Decreased  Mood:  Anxious, Depressed, Hopeless, Irritable and Worthless  Affect:  Blunt, Congruent and Depressed  Thought Process:  Coherent, Goal Directed and Descriptions of Associations: Intact  Orientation:  Full (Time, Place, and Person)  Thought Content:  Logical and Hallucinations: None  Suicidal Thoughts:  Yes.  without intent/plan  Homicidal Thoughts:  No  Memory:  Immediate;    Good Recent;   Fair  Judgement:  Impaired  Insight:  Fair  Psychomotor Activity:  Decreased  Concentration: Concentration: Fair and Attention Span: Fair  Recall:  [employer] of Knowledge:Fair  Language: Good  Akathisia:  NA  Handed:  Right  AIMS (if indicated):     Assets:  Desire for Improvement Housing Intimacy Leisure Time Physical Health  Sleep:       Musculoskeletal: Strength & Muscle Tone: within normal limits Gait & Station: normal   unknown if currently breastfeeding.  Recommendations:  Based on my evaluation the patient does not appear to have an emergency medical condition.  Jackelyn Poling, NP 10/27/2018, 10:35 PM
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
## 2018-10-28 ENCOUNTER — Encounter (HOSPITAL_COMMUNITY): Payer: Self-pay

## 2018-10-28 ENCOUNTER — Other Ambulatory Visit: Payer: Self-pay

## 2018-10-28 DIAGNOSIS — F419 Anxiety disorder, unspecified: Secondary | ICD-10-CM

## 2018-10-28 DIAGNOSIS — F332 Major depressive disorder, recurrent severe without psychotic features: Principal | ICD-10-CM

## 2018-10-28 DIAGNOSIS — G4700 Insomnia, unspecified: Secondary | ICD-10-CM

## 2018-10-28 LAB — LIPID PANEL
CHOL/HDL RATIO: 3.2 ratio
Cholesterol: 135 mg/dL (ref 0–200)
HDL: 42 mg/dL (ref >40)
LDL Cholesterol: 46 mg/dL (ref 0–99)
Triglycerides: 236 mg/dL — ABNORMAL HIGH (ref <150)
VLDL: 47 mg/dL — ABNORMAL HIGH (ref 0–40)

## 2018-10-28 LAB — COMPREHENSIVE METABOLIC PANEL
ALT: 12 U/L (ref 0–44)
AST: 12 U/L — ABNORMAL LOW (ref 15–41)
Albumin: 3.9 g/dL (ref 3.5–5.0)
Alkaline Phosphatase: 33 U/L — ABNORMAL LOW (ref 38–126)
Anion gap: 5 (ref 5–15)
BUN: 12 mg/dL (ref 6–20)
CALCIUM: 9 mg/dL (ref 8.9–10.3)
CO2: 27 mmol/L (ref 22–32)
Chloride: 109 mmol/L (ref 98–111)
Creatinine, Ser: 0.57 mg/dL (ref 0.44–1.00)
GFR calc Af Amer: >60 mL/min (ref >60)
GFR calc non Af Amer: >60 mL/min (ref >60)
Glucose, Bld: 90 mg/dL (ref 70–99)
Potassium: 3.9 mmol/L (ref 3.5–5.1)
SODIUM: 141 mmol/L (ref 135–145)
Total Bilirubin: 0.5 mg/dL (ref 0.3–1.2)
Total Protein: 6.1 g/dL — ABNORMAL LOW (ref 6.5–8.1)

## 2018-10-28 LAB — CBC
HCT: 38.4 % (ref 36.0–46.0)
Hemoglobin: 12.3 g/dL (ref 12.0–15.0)
MCH: 30.1 pg (ref 26.0–34.0)
MCHC: 32 g/dL (ref 30.0–36.0)
MCV: 94.1 fL (ref 80.0–100.0)
Platelets: 170 10*3/uL (ref 150–400)
RBC: 4.08 MIL/uL (ref 3.87–5.11)
RDW: 12 % (ref 11.5–15.5)
WBC: 5.7 10*3/uL (ref 4.0–10.5)
nRBC: 0 % (ref 0.0–0.2)

## 2018-10-28 LAB — RAPID URINE DRUG SCREEN, HOSP PERFORMED
Amphetamines: POSITIVE — AB
Barbiturates: NOT DETECTED
Benzodiazepines: NOT DETECTED
Cocaine: NOT DETECTED
OPIATES: NOT DETECTED
Tetrahydrocannabinol: POSITIVE — AB

## 2018-10-28 LAB — PREGNANCY, URINE: Preg Test, Ur: NEGATIVE

## 2018-10-28 LAB — TSH: TSH: 3.14 u[IU]/mL (ref 0.350–4.500)

## 2018-10-28 LAB — HEMOGLOBIN A1C
Hgb A1c MFr Bld: 4.5 % — ABNORMAL LOW (ref 4.8–5.6)
Mean Plasma Glucose: 82.45 mg/dL

## 2018-10-28 MED ORDER — NEOMYCIN-POLYMYXIN-HC 3.5-10000-1 OT SUSP
3.0000 [drp] | Freq: Three times a day (TID) | OTIC | Status: AC
  Administered 2018-10-28 – 2018-11-02 (×15): 3 [drp] via OTIC
  Filled 2018-10-28: qty 10

## 2018-10-28 MED ORDER — SERTRALINE HCL 25 MG PO TABS
25.0000 mg | ORAL_TABLET | Freq: Every day | ORAL | Status: DC
  Filled 2018-10-28 (×2): qty 1

## 2018-10-28 MED ORDER — NEOMYCIN-POLYMYXIN-HC 1 % OT SOLN
3.0000 [drp] | Freq: Three times a day (TID) | OTIC | Status: DC
  Filled 2018-10-28 (×2): qty 10

## 2018-10-28 MED ORDER — FLUOXETINE HCL 20 MG PO CAPS
20.0000 mg | ORAL_CAPSULE | Freq: Every day | ORAL | Status: DC
  Filled 2018-10-28: qty 1

## 2018-10-28 MED ORDER — CIPROFLOXACIN-DEXAMETHASONE 0.3-0.1 % OT SUSP: 4.0000 [drp] | Freq: Two times a day (BID) | OTIC | Status: DC

## 2018-10-28 MED ORDER — FLUOXETINE HCL 10 MG PO CAPS
10.0000 mg | ORAL_CAPSULE | Freq: Every day | ORAL | Status: DC
  Administered 2018-10-29 – 2018-10-31 (×3): 10 mg via ORAL
  Filled 2018-10-28 (×4): qty 1
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
## 2018-10-28 NOTE — [facility] Group Notes (Signed)
LCSW Group Therapy Note  10/28/2018   10:00-11:00am   Type of Therapy and Topic:  Group Therapy: Anger Cues and Responses  Participation Level:  Did Not Attend   Emanuele Mcwhirter N Evalyse Stroope   

## 2018-10-28 NOTE — Tx Team (Signed)
Initial Treatment Plan 10/28/2018 1:49 AM Christine Beard VUD:[id number]    PATIENT STRESSORS: Financial difficulties Health problems Traumatic event   PATIENT STRENGTHS: Ability for insight Active sense of humor Average or above average intelligence Capable of independent living   PATIENT IDENTIFIED PROBLEMS: depression  Anxiety  "Be happy again"  'Not feeling like am going crazy."               DISCHARGE CRITERIA:  Ability to meet basic life and health needs Adequate post-discharge living arrangements Improved stabilization in mood, thinking, and/or behavior Safe-care adequate arrangements made  PRELIMINARY DISCHARGE PLAN: Attend aftercare/continuing care group Attend PHP/IOP Return to previous living arrangement Return to previous work or school arrangements  PATIENT/FAMILY INVOLVEMENT: This treatment plan has been presented to and reviewed with the patient, Christine Beard, and/or family member.  The patient and family have been given the opportunity to ask questions and make suggestions.  Bethann Punches, RN 10/28/2018, 1:49 AM

## 2018-10-28 NOTE — Progress Notes (Signed)
Christine Beard is a 28 y.o. female Voluntaryadmitted for increased depression and anxiety. Pt stated she has been dealing with these since age 44. Pt stated it became worse after she found out that her 105 year old daughter is legally blind and has a problem with swallowing and will be requiring braces to walk. She also has a 30 yr old who is hard to manage. Her fiance helps but he has to go to work. During admission, pt was cooperative, alert and oriented x 4. Consents signed, skin/belongings search completed and pt oriented to unit. Pt stable at this time. Pt given the opportunity to express concerns and ask questions. Pt given toiletries. Will continue to monitor.
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
## 2018-10-28 NOTE — Progress Notes (Addendum)
D Pt is observed OOB UAL on the 400 hall today-she tolerates this fairly well. She wears her own clothese. She speaks softly, presents with flat, depressed and anxious demeanor and mood, SHe says she is not happy to be here, that she desn't undertand why she is experiencing the problems that she is and that her dperession has been getting wrose and worse " for some time now".     A She did complete her daily assessment and on this she wrote she denied having suicidal ideaiton today and she rated her depression, hopelessness anda nxieyt " 07/13/09", respectively. She is attending her groups, is actively trying to identify her unhealthy behaviors and is actively trying to " get better"     R Safety is in place.

## 2018-10-28 NOTE — [facility] Group Notes (Signed)
Pt did not attend wrap up group this evening.  

## 2018-10-28 NOTE — Plan of Care (Signed)
  Problem: Spiritual Needs Goal: Ability to function at adequate level Outcome: Progressing   
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
## 2018-10-29 ENCOUNTER — Inpatient Hospital Stay (HOSPITAL_COMMUNITY): Admission: RE | Admit: 2018-10-29 | Payer: Medicaid Other | Source: Home / Self Care | Admitting: Psychiatry

## 2018-10-29 ENCOUNTER — Ambulatory Visit (HOSPITAL_COMMUNITY)
Admission: RE | Admit: 2018-10-29 | Discharge: 2018-10-29 | Disposition: A | Discharge status: Home / Self Care | Payer: Medicaid Other | Source: Home / Self Care | Attending: Psychiatry | Admitting: Psychiatry

## 2018-10-29 MED ORDER — PSEUDOEPHEDRINE HCL 30 MG PO TABS
30.0000 mg | ORAL_TABLET | Freq: Three times a day (TID) | ORAL | Status: DC | PRN
  Administered 2018-10-29 – 2018-10-30 (×2): 30 mg via ORAL
  Filled 2018-10-29 (×2): qty 1

## 2018-10-29 MED ORDER — AZITHROMYCIN 500 MG PO TABS
500.0000 mg | ORAL_TABLET | Freq: Every day | ORAL | Status: AC
  Filled 2018-10-29: qty 1

## 2018-10-29 MED ORDER — AZITHROMYCIN 250 MG PO TABS
250.0000 mg | ORAL_TABLET | Freq: Every day | ORAL | Status: AC
  Administered 2018-10-30 – 2018-11-02 (×4): 250 mg via ORAL
  Filled 2018-10-29 (×5): qty 1

## 2018-10-29 MED ORDER — MIRTAZAPINE 7.5 MG PO TABS
7.5000 mg | ORAL_TABLET | Freq: Every day | ORAL | Status: DC
  Administered 2018-10-29 – 2018-10-31 (×3): 7.5 mg via ORAL
  Filled 2018-10-29 (×5): qty 1

## 2018-10-29 NOTE — Progress Notes (Signed)
[facility] MD Progress Note  10/29/2018 10:59 AM Christine Beard  MRN:  [id number] Subjective: Patient reports persistent depression, denies suicidal ideations.  Denies medication side effects thus far.  Does complain of nausea, no vomiting. Objective: I have reviewed chart notes and have met with patient. 28 year old female, lives with fianc, has 2 children.  Presented due to worsening depression, neurovegetative symptoms of depression, passive SI.  Stressors include her 47-year-old daughter having chronic medical illness and left eye blindness, financial stressors.  She reported history of good response to fluoxetine in the past.   Today patient reports persistent depression, affect remains constricted but improved during session and smiles at times appropriately throughout session.  Denies suicidal ideations. Describes insomnia, fair appetite, low energy level. Tolerating Prozac trial well thus far.  No disruptive or agitated behaviors on unit, pleasant on approach, visible in milieu, limited interaction with peers.   Principal Problem: Severe recurrent major depression without psychotic features ([place]) Diagnosis: Principal Problem:   Severe recurrent major depression without psychotic features ([place])  Total Time spent with patient: 20 minutes  Past Psychiatric History:   Past Medical History:  Past Medical History:  Diagnosis Date  . Anxiety   . Depression   . Headache   . Hypothyroidism   . S/P tubal ligation 10/06/2017  . SVD (spontaneous vaginal delivery) 10/05/2017  . Thyroid disease     Past Surgical History:  Procedure Laterality Date  . TOE SURGERY    . TONSILLECTOMY    . TUBAL LIGATION N/A 10/06/2017   Procedure: POST PARTUM TUBAL LIGATION;  Surgeon: Janyth Contes, MD;  Location: [facility];  Service: Gynecology;  Laterality: N/A;   Family History:  Family History  Problem Relation Age of Onset  . Diabetes Maternal Grandmother   . Diabetes Paternal  Grandmother    Family Psychiatric  History:  Social History:  Social History   Substance and Sexual Activity  Alcohol Use No     Social History   Substance and Sexual Activity  Drug Use No    Social History   Socioeconomic History  . Marital status: Significant Other    Spouse name: Not on file  . Number of children: Not on file  . Years of education: Not on file  . Highest education level: Not on file  Occupational History  . Not on file  Social Needs  . Financial resource strain: Not on file  . Food insecurity:    Worry: Not on file    Inability: Not on file  . Transportation needs:    Medical: Not on file    Non-medical: Not on file  Tobacco Use  . Smoking status: Former Smoker    Last attempt to quit: 09/03/2014    Years since quitting: 4.1  . Smokeless tobacco: Never Used  Substance and Sexual Activity  . Alcohol use: No  . Drug use: No  . Sexual activity: Yes    Birth control/protection: None  Lifestyle  . Physical activity:    Days per week: Not on file    Minutes per session: Not on file  . Stress: Not on file  Relationships  . Social connections:    Talks on phone: Not on file    Gets together: Not on file    Attends religious service: Not on file    Active member of club or organization: Not on file    Attends meetings of clubs or organizations: Not on file    Relationship status: Not on  file  Other Topics Concern  . Not on file  Social History Narrative  . Not on file   Additional Social History:    Pain Medications: Denies use Prescriptions: Denies use Over the Counter: Denies use History of alcohol / drug use?: No history of alcohol / drug abuse Longest period of sobriety (when/how long): NA   Sleep: Fair  Appetite:  Fair  Current Medications: Current Facility-Administered Medications  Medication Dose Route Frequency Provider Last Rate Last Dose  . acetaminophen (TYLENOL) tablet 650 mg  650 mg Oral Q6H PRN Lindon Romp A, NP   650  mg at 10/29/18 0914  . alum & mag hydroxide-simeth (MAALOX/MYLANTA) 200-200-20 MG/5ML suspension 30 mL  30 mL Oral Q4H PRN Lindon Romp A, NP      . FLUoxetine (PROZAC) capsule 10 mg  10 mg Oral Daily Anessa Charley, Myer Peer, MD   10 mg at 10/29/18 0759  . hydrOXYzine (ATARAX/VISTARIL) tablet 25 mg  25 mg Oral TID PRN Rozetta Nunnery, NP   25 mg at 10/29/18 0807  . magnesium hydroxide (MILK OF MAGNESIA) suspension 30 mL  30 mL Oral Daily PRN Lindon Romp A, NP      . mirtazapine (REMERON) tablet 7.5 mg  7.5 mg Oral QHS Ziyon Soltau, Myer Peer, MD      . neomycin-polymyxin-hydrocortisone (CORTISPORIN) OTIC (EAR) suspension 3 drop  3 drop Left EAR Q8H Makiah Foye, Myer Peer, MD   3 drop at 10/29/18 0630    Lab Results:  Results for orders placed or performed during the hospital encounter of 10/27/18 (from the past 48 hour(s))  Urine rapid drug screen (hosp performed)not at [facility]     Status: Abnormal   Collection Time: 10/28/18 12:58 AM  Result Value Ref Range   Opiates NONE DETECTED NONE DETECTED   Cocaine NONE DETECTED NONE DETECTED   Benzodiazepines NONE DETECTED NONE DETECTED   Amphetamines POSITIVE (A) NONE DETECTED   Tetrahydrocannabinol POSITIVE (A) NONE DETECTED   Barbiturates NONE DETECTED NONE DETECTED    Comment: (NOTE) DRUG SCREEN FOR MEDICAL PURPOSES ONLY.  IF CONFIRMATION IS NEEDED FOR ANY PURPOSE, NOTIFY LAB WITHIN 5 DAYS. LOWEST DETECTABLE LIMITS FOR URINE DRUG SCREEN Drug Class                     Cutoff (ng/mL) Amphetamine and metabolites    1000 Barbiturate and metabolites    200 Benzodiazepine                 549 Tricyclics and metabolites     300 Opiates and metabolites        300 Cocaine and metabolites        300 THC                            50 Performed at [facility], [place] [address] [place], [place] [address]   Pregnancy, urine     Status: None   Collection Time: 10/28/18 12:58 AM  Result Value Ref Range   Preg Test, Ur NEGATIVE NEGATIVE    Comment:         THE SENSITIVITY OF THIS METHODOLOGY IS >20 mIU/mL. Performed at [facility], [place] [address] [place], [place] [address]   CBC     Status: None   Collection Time: 10/28/18  6:46 AM  Result Value Ref Range   WBC 5.7 4.0 - 10.5 K/uL   RBC 4.08 3.87 -  5.11 MIL/uL   Hemoglobin 12.3 12.0 - 15.0 g/dL   HCT 38.4 36.0 - 46.0 %   MCV 94.1 80.0 - 100.0 fL   MCH 30.1 26.0 - 34.0 pg   MCHC 32.0 30.0 - 36.0 g/dL   RDW 12.0 11.5 - 15.5 %   Platelets 170 150 - 400 K/uL   nRBC 0.0 0.0 - 0.2 %    Comment: Performed at [facility], [place] [address], [place], [place] [address]  Comprehensive metabolic panel     Status: Abnormal   Collection Time: 10/28/18  6:46 AM  Result Value Ref Range   Sodium 141 135 - 145 mmol/L   Potassium 3.9 3.5 - 5.1 mmol/L   Chloride 109 98 - 111 mmol/L   CO2 27 22 - 32 mmol/L   Glucose, Bld 90 70 - 99 mg/dL   BUN 12 6 - 20 mg/dL   Creatinine, Ser 0.57 0.44 - 1.00 mg/dL   Calcium 9.0 8.9 - 10.3 mg/dL   Total Protein 6.1 (L) 6.5 - 8.1 g/dL   Albumin 3.9 3.5 - 5.0 g/dL   AST 12 (L) 15 - 41 U/L   ALT 12 0 - 44 U/L   Alkaline Phosphatase 33 (L) 38 - 126 U/L   Total Bilirubin 0.5 0.3 - 1.2 mg/dL   GFR calc non Af Amer >60 >60 mL/min   GFR calc Af Amer >60 >60 mL/min   Anion gap 5 5 - 15    Comment: Performed at [facility], [place] [address] [place], [place] [address]  Hemoglobin A1c     Status: Abnormal   Collection Time: 10/28/18  6:46 AM  Result Value Ref Range   Hgb A1c MFr Bld 4.5 (L) 4.8 - 5.6 %    Comment: (NOTE) Pre diabetes:          5.7%-6.4% Diabetes:              >6.4% Glycemic control for   <7.0% adults with diabetes    Mean Plasma Glucose 82.45 mg/dL    Comment: Performed at [place] [address], [place], [place] [address]  Lipid panel     Status: Abnormal   Collection Time: 10/28/18  6:46 AM  Result Value Ref Range   Cholesterol 135 0 - 200 mg/dL   Triglycerides 236  (H) <150 mg/dL   HDL 42 >40 mg/dL   Total CHOL/HDL Ratio 3.2 RATIO   VLDL 47 (H) 0 - 40 mg/dL   LDL Cholesterol 46 0 - 99 mg/dL    Comment:        Total Cholesterol/HDL:CHD Risk Coronary Heart Disease Risk Table                     Men   Women  1/2 Average Risk   3.4   3.3  Average Risk       5.0   4.4  2 X Average Risk   9.6   7.1  3 X Average Risk  23.4   11.0        Use the calculated Patient Ratio above and the CHD Risk Table to determine the patient's CHD Risk.        ATP III CLASSIFICATION (LDL):  <100     mg/dL   Optimal  100-129  mg/dL   Near or Above                    Optimal  130-159  mg/dL   Borderline  160-189  mg/dL   High  >190     mg/dL   Very High Performed at [facility] [address], [place], [place] [address]   TSH     Status: None   Collection Time: 10/28/18  6:46 AM  Result Value Ref Range   TSH 3.140 0.350 - 4.500 uIU/mL    Comment: Performed by a 3rd Generation assay with a functional sensitivity of <=0.01 uIU/mL. Performed at [facility], [place] [address] [place], [place] [address]     Blood Alcohol level:  No results found for: [facility]  Metabolic Disorder Labs: Lab Results  Component Value Date   HGBA1C 4.5 (L) 10/28/2018   MPG 82.45 10/28/2018   No results found for: PROLACTIN Lab Results  Component Value Date   CHOL 135 10/28/2018   TRIG 236 (H) 10/28/2018   HDL 42 10/28/2018   CHOLHDL 3.2 10/28/2018   VLDL 47 (H) 10/28/2018   LDLCALC 46 10/28/2018    Physical Findings: AIMS:  , ,  ,  ,    CIWA:    COWS:     Musculoskeletal: Strength & Muscle Tone: within normal limits Gait & Station: normal Patient leans: N/A  Psychiatric Specialty Exam: Physical Exam  ROS no headache, reports left ear discomfort, no chest pain, no shortness of breath, reports nausea, no vomiting  Blood pressure 130/88, pulse 88, temperature (!) 97.5 F (36.4 C), temperature source Oral, resp. rate 18, height 1'  10.05" (0.56 m), weight 45.4 kg, SpO2 100 %, unknown if currently breastfeeding.Body mass index is 144.65 kg/m.  General Appearance: Fairly Groomed  Eye Contact:  Good  Speech:  Normal Rate  Volume:  Normal  Mood:  remains depressed  Affect:  Constricted, but more reactive today, tends to improve during session, smiles at times appropriately  Thought Process:  Linear and Descriptions of Associations: Intact  Orientation:  Full (Time, Place, and Person)  Thought Content:  No hallucinations, no delusions  Suicidal Thoughts:  No denies suicidal ideations at this time, contracts for safety on unit  Homicidal Thoughts:  No currently does not endorse homicidal or violent ideations  Memory:  Recent and remote grossly intact  Judgement:  Fair/improving  Insight:  Fair  Psychomotor Activity:  Normal  Concentration:  Concentration: Good and Attention Span: Good  Recall:  Good  Fund of Knowledge:  Good  Language:  Good  Akathisia:  Negative  Handed:  Right  AIMS (if indicated):     Assets:  Desire for Improvement Resilience  ADL's:  Intact  Cognition:  WNL  Sleep:  Number of Hours: 6.75   Assessment-  28 year old female, lives with fianc, has 2 children.  Presented due to worsening depression, neurovegetative symptoms of depression, passive SI.  Stressors include her 73-year-old daughter having chronic medical illness and left eye blindness, financial stressors.  She reported history of good response to fluoxetine in the past.   Patient remains depressed, although affect is more reactive today.  Denies suicidal ideations at this time.  Continues to endorse neurovegetative symptoms, and describes fair sleep and appetite.  She has a history of good response to fluoxetine in the past and is thus far tolerating trial well.  We discussed options-agrees to adding Remeron for antidepressant management and to help with insomnia/appetite.  Side effects reviewed. Treatment Plan Summary: Daily contact  with patient to assess and evaluate symptoms and progress in treatment, Medication management, Plan Inpatient treatment  and Medications as below Encourage group and milieu participation to work on coping skills and symptom reduction. Continue Prozac 10 mg daily for depression Start Remeron 7.5 mg nightly for depression and insomnia Continue Vistaril 25 mg every 8 hours PRN for anxiety as needed Discontinue Trazodone Corticosporin suspension/drops to left ear to address otic discomfort Treatment team working on disposition planning options. Jenne Campus, MD 10/29/2018, 10:59 AM

## 2018-10-29 NOTE — [facility] Group Notes (Signed)
Pt did not attend wrap up group this evening.  

## 2018-10-29 NOTE — [facility] Group Notes (Addendum)
[facility] LCSW Group Therapy Note  Date/Time:  10/29/2018 9:00-10:00 or 10:00-11:00AM  Type of Therapy and Topic:  Group Therapy:  Healthy and Unhealthy Supports  Participation Level:  Did Not Attend   Description of Group:  Patients in this group were introduced to the idea of adding a variety of healthy supports to address the various needs in their lives.Patients discussed what additional healthy supports could be helpful in their recovery and wellness after discharge in order to prevent future hospitalizations.   An emphasis was placed on using counselor, doctor, therapy groups, 12-step groups, and problem-specific support groups to expand supports.  They also worked as a group on developing a specific plan for several patients to deal with unhealthy supports through boundary-setting, psychoeducation with loved ones, and even termination of relationships.   Therapeutic Goals:   1)  discuss importance of adding supports to stay well once out of the hospital  2)  compare healthy versus unhealthy supports and identify some examples of each  3)  generate ideas and descriptions of healthy supports that can be added  4)  offer mutual support about how to address unhealthy supports  5)  encourage active participation in and adherence to discharge plan    Summary of Patient Progress:  Did not attend  Therapeutic Modalities:   Motivational Interviewing Brief Solution-Focused Therapy  Abhiram Criado D Gustava [place]land         

## 2018-10-29 NOTE — Progress Notes (Signed)
D Patient presented to this Clinical research associate ( upon returning from the gym for recreation therapy 0 cradling her left wrist with an ice pack and said " I fell when I was kicking the ball". Wrist is in place, neurovascular status of hand and fingers in tact. Pt denies hitting her head. NP (TM) and AC (TT) and CN (JN) notified and will send pt next shift to [facility] for xray to determine if there is fracture. Writer observed pt immediatley after telling nurse she had fallen and hurt her wrist-using her wrist , holding her fork and eating with her left hand. A order for xray obtained. R Pt made aware. Safeyt in place. No swelling , bruising, visible injury noted to wrist as of yet. Ice applied.

## 2018-10-29 NOTE — Progress Notes (Signed)
D Patient presents at the main nurses station this morning at change of shift. She is disheveled, she wears her clothese, her hair is uncombed. She holds her arms across her chest. She makes eye contact with this writer and says " are you my nurse again today? I'm nauseated". Writer spoke with patient, who says" I'm worried about my nausea. I need something to make it go away. "Her affect is flat, depressed, nervous and anxious.      A She completed her daily assessment and on this she wrote she has experienced suicidal ideation today, but she verbally contracts with this writer to not hurt herself and to seek writer's support if she feels she cannot stay safe today. She shakes her head"yes" and says "yes I'll come find you". She rated her depression, hopelessness and axiety "07/13/09", respectively. She says she couldn't sleep last night and that she's " really tired" today. After she met with the practitioner, her trazadone was dc'd and she is started on  remeron  7.5 mg po hs - first dose tonight.     R Safety is in place. 

## 2018-10-29 NOTE — Plan of Care (Signed)
  Problem: Education: Goal: Knowledge of [facility] Education information/materials will improve Outcome: Progressing   

## 2018-10-29 NOTE — Progress Notes (Signed)
DAR: Pt has been isolative, visited with her fiance which pt said went well. Pt did not attend the wrap up group. Pt complained of ringing and pain in the left ear, cortisporin ear drops ordered  and administered . Denies  auditory and visual hallucinations.  .  Maintained on routine safety checks.  Medications given as prescribed.  Support and encouragement offered as needed. Will continue to monitor.

## 2018-10-29 NOTE — [facility] Group Notes (Signed)
[facility] Group Notes:  (Nursing)  Date:  10/29/2018  Time: 200 PM Type of Therapy:  Nurse Education  Participation Level:  Active  Participation Quality:  Appropriate  Affect:  Appropriate  Cognitive:  Appropriate  Insight:  Appropriate  Engagement in Group:  Engaged  Modes of Intervention:  Discussion and Education  Summary of Progress/Problems: Life Skills Group/ anger management Shela Nevin 10/29/2018, 8:09 PM

## 2018-10-30 MED ORDER — PSEUDOEPHEDRINE HCL 30 MG PO TABS
30.0000 mg | ORAL_TABLET | Freq: Two times a day (BID) | ORAL | Status: DC | PRN
  Administered 2018-10-31 – 2018-11-02 (×3): 30 mg via ORAL
  Filled 2018-10-30 (×3): qty 1

## 2018-10-30 NOTE — Progress Notes (Signed)
Recreation Therapy Notes  Date:  1.27.20 Time: 0930 Location: 300 [address] Dayroom  Group Topic: Stress Management  Goal Area(s) Addresses:  Patient will identify positive stress management techniques. Patient will identify benefits of using stress management post d/c.  Intervention:  Stress Management  Activity :  Meditation.  LRT introduced stress management technique of meditation.  LRT played a meditation that focused on the possibilities the day could bring.  Patients were to follow along as meditation played in order to engage in meditation.  Education:  Stress Management, Discharge Planning.   Education Outcome: Acknowledges Education  Clinical Observations/Feedback: Pt did not attend group.    Caroll Rancher, LRT/CTRS         Caroll Rancher A 10/30/2018 12:25 PM

## 2018-10-30 NOTE — Progress Notes (Signed)
D:  Patient's self inventory sheet, patient has poor sleep, sleep medication not helpful.  Poor appetite, low energy level, poor concentration.  Rated depression, hopeless and anxiety #10.  Denied withdrawals.  Denied SI.  Physical problems, lightheaded, pain, dizziness, headaches.  Physical pain, worst pain in past 24 hours #9, ear, head.  Pain medication helpful.  Goal is getting through the day.  Plans to attend all meetings.  No discharge plans.  "Feeling like I can't do it or hopeless." A:  Medications administered per MD orders.  Emotional support and encouragement given patient. R:  Denied HI.  Denied A/V hallucinations.  Patient told nurse this morning that she does have SI thoughts at times, no plan, contracts for safety.  Safety maintained with 15 minute checks.

## 2018-10-30 NOTE — [facility] Group Notes (Signed)
LCSW Group Therapy Note 10/30/2018 2:14 PM  Type of Therapy and Topic: Group Therapy: Overcoming Obstacles  Participation Level: Active  Description of Group:  In this group patients will be encouraged to explore what they see as obstacles to their own wellness and recovery. They will be guided to discuss their thoughts, feelings, and behaviors related to these obstacles. The group will process together ways to cope with barriers, with attention given to specific choices patients can make. Each patient will be challenged to identify changes they are motivated to make in order to overcome their obstacles. This group will be process-oriented, with patients participating in exploration of their own experiences as well as giving and receiving support and challenge from other group members.  Therapeutic Goals: 1. Patient will identify personal and current obstacles as they relate to admission. 2. Patient will identify barriers that currently interfere with their wellness or overcoming obstacles.  3. Patient will identify feelings, thought process and behaviors related to these barriers. 4. Patient will identify two changes they are willing to make to overcome these obstacles:   Summary of Patient Progress  Shaterra was engaged and participated throughout the group session. Neveah reports that her main obstacle is caring for her 30 year old, who was recently diagnosed as being "legally blind". She reports that her 30 year old also have other physical disabilities and that it is stressful caring for her with limited supports. Suzuko states that she plans on strengthening her support system.     Therapeutic Modalities:  Cognitive Behavioral Therapy Solution Focused Therapy Motivational Interviewing Relapse Prevention Therapy   Alcario Drought Clinical Social Worker

## 2018-10-30 NOTE — [facility] Group Notes (Signed)
[facility] Group Notes:  (Nursing/MHT/Case Management/Adjunct)  Date:  10/30/2018  Time:  4:15 pm  Type of Therapy:  Psychoeducational Skills  Participation Level:  Active  Participation Quality:  Appropriate  Affect:  Appropriate  Cognitive:  Appropriate  Insight:  Appropriate  Engagement in Group:  Engaged  Modes of Intervention:  Education  Summary of Progress/Problems: Patient was alert and active during group appropriately.    Quintella Reichert [place] 10/30/2018, 5:03 PM

## 2018-10-30 NOTE — Progress Notes (Signed)
EKG completed and put on MD desk for review. 

## 2018-10-30 NOTE — Progress Notes (Signed)
D: Pt was in dayroom upon initial approach.  Pt presents with depressed, anxious affect and mood.  She smiles with interaction.  She describes her day as "okay" and reports she "almost had a panic attack earlier but I'm okay."  Pt denies SI/HI, denies hallucinations.  Pt has been visible in milieu interacting with peers and staff appropriately.  Pt attended evening group.    A: Introduced self to pt.  Met with pt 1:1.  Actively listened to pt and offered support and encouragement.  Medications administered per order.  PRN medication administered for anxiety.  Q15 minute safety checks maintained.  R: Pt is safe on the unit.  Pt is compliant with medications.  Pt verbally contracts for safety.  Will continue to monitor and assess.

## 2018-10-30 NOTE — Tx Team (Signed)
Interdisciplinary Treatment and Diagnostic Plan Update  10/30/2018 Time of Session: 10:00am Christine Beard MRN: [id number]  Principal Diagnosis: Severe recurrent major depression without psychotic features [facility](HCC)  Secondary Diagnoses: Principal Problem:   Severe recurrent major depression without psychotic features (HCC)   Current Medications:  Current Facility-Administered Medications  Medication Dose Route Frequency Provider Last Rate Last Dose  . acetaminophen (TYLENOL) tablet 650 mg  650 mg Oral Q6H PRN Nira ConnBerry, Jason A, NP   650 mg at 10/30/18 0751  . alum & mag hydroxide-simeth (MAALOX/MYLANTA) 200-200-20 MG/5ML suspension 30 mL  30 mL Oral Q4H PRN Nira ConnBerry, Jason A, NP      . azithromycin (ZITHROMAX) tablet 250 mg  250 mg Oral Daily Oneta RackLewis, Tanika N, NP   250 mg at 10/30/18 0748  . FLUoxetine (PROZAC) capsule 10 mg  10 mg Oral Daily Cobos, Rockey SituFernando A, MD   10 mg at 10/30/18 0746  . hydrOXYzine (ATARAX/VISTARIL) tablet 25 mg  25 mg Oral TID PRN Jackelyn PolingBerry, Jason A, NP   25 mg at 10/30/18 0751  . magnesium hydroxide (MILK OF MAGNESIA) suspension 30 mL  30 mL Oral Daily PRN Nira ConnBerry, Jason A, NP      . mirtazapine (REMERON) tablet 7.5 mg  7.5 mg Oral QHS Cobos, Fernando A, MD   7.5 mg at 10/29/18 2110  . neomycin-polymyxin-hydrocortisone (CORTISPORIN) OTIC (EAR) suspension 3 drop  3 drop Left EAR Q8H Cobos, Rockey SituFernando A, MD   3 drop at 10/30/18 0630  . pseudoephedrine (SUDAFED) tablet 30 mg  30 mg Oral Q8H PRN Oneta RackLewis, Tanika N, NP   30 mg at 10/29/18 1302   PTA Medications: Medications Prior to Admission  Medication Sig Dispense Refill Last Dose  . acetaminophen (TYLENOL) 500 MG tablet Take 1,000 mg by mouth every 6 (six) hours as needed for mild pain or headache.    10/29/2017  . ibuprofen (ADVIL,MOTRIN) 600 MG tablet Take 1 tablet (600 mg total) by mouth every 6 (six) hours. 30 tablet 0 10/29/2017 at Unknown time  . ibuprofen (IBU) 600 MG tablet IBU 600 mg tablet  take 1 tablet by mouth every 6  hours     . levothyroxine (SYNTHROID, LEVOTHROID) 50 MCG tablet levothyroxine 50 mcg tablet  take 1 tablet by mouth once daily   Past Month at Unknown time    Patient Stressors: Financial difficulties Health problems Traumatic event  Patient Strengths: Ability for insight Active sense of humor Average or above average intelligence Capable of independent living  Treatment Modalities: Medication Management, Group therapy, Case management,  1 to 1 session with clinician, Psychoeducation, Recreational therapy.   Physician Treatment Plan for Primary Diagnosis: Severe recurrent major depression without psychotic features (HCC) Long Term Goal(s): Improvement in symptoms so as ready for discharge Improvement in symptoms so as ready for discharge   Short Term Goals: Ability to verbalize feelings will improve Ability to disclose and discuss suicidal ideas Ability to demonstrate self-control will improve Compliance with prescribed medications will improve Ability to identify triggers associated with substance abuse/mental health issues will improve Ability to disclose and discuss suicidal ideas Ability to demonstrate self-control will improve Ability to identify and develop effective coping behaviors will improve Ability to maintain clinical measurements within normal limits will improve Compliance with prescribed medications will improve  Medication Management: Evaluate patient's response, side effects, and tolerance of medication regimen.  Therapeutic Interventions: 1 to 1 sessions, Unit Group sessions and Medication administration.  Evaluation of Outcomes: Progressing  Physician Treatment Plan for Secondary Diagnosis:  Principal Problem:   Severe recurrent major depression without psychotic features (HCC)  Long Term Goal(s): Improvement in symptoms so as ready for discharge Improvement in symptoms so as ready for discharge   Short Term Goals: Ability to verbalize feelings will  improve Ability to disclose and discuss suicidal ideas Ability to demonstrate self-control will improve Compliance with prescribed medications will improve Ability to identify triggers associated with substance abuse/mental health issues will improve Ability to disclose and discuss suicidal ideas Ability to demonstrate self-control will improve Ability to identify and develop effective coping behaviors will improve Ability to maintain clinical measurements within normal limits will improve Compliance with prescribed medications will improve     Medication Management: Evaluate patient's response, side effects, and tolerance of medication regimen.  Therapeutic Interventions: 1 to 1 sessions, Unit Group sessions and Medication administration.  Evaluation of Outcomes: Progressing   RN Treatment Plan for Primary Diagnosis: Severe recurrent major depression without psychotic features (HCC) Long Term Goal(s): Knowledge of disease and therapeutic regimen to maintain health will improve  Short Term Goals: Ability to verbalize frustration and anger appropriately will improve, Ability to disclose and discuss suicidal ideas, Ability to identify and develop effective coping behaviors will improve and Compliance with prescribed medications will improve  Medication Management: RN will administer medications as ordered by provider, will assess and evaluate patient's response and provide education to patient for prescribed medication. RN will report any adverse and/or side effects to prescribing provider.  Therapeutic Interventions: 1 on 1 counseling sessions, Psychoeducation, Medication administration, Evaluate responses to treatment, Monitor vital signs and CBGs as ordered, Perform/monitor CIWA, COWS, AIMS and Fall Risk screenings as ordered, Perform wound care treatments as ordered.  Evaluation of Outcomes: Progressing   LCSW Treatment Plan for Primary Diagnosis: Severe recurrent major depression  without psychotic features (HCC) Long Term Goal(s): Safe transition to appropriate next level of care at discharge, Engage patient in therapeutic group addressing interpersonal concerns.  Short Term Goals: Engage patient in aftercare planning with referrals and resources, Increase social support, Increase emotional regulation, Identify triggers associated with mental health/substance abuse issues and Increase skills for wellness and recovery  Therapeutic Interventions: Assess for all discharge needs, 1 to 1 time with Social worker, Explore available resources and support systems, Assess for adequacy in community support network, Educate family and significant other(s) on suicide prevention, Complete Psychosocial Assessment, Interpersonal group therapy.  Evaluation of Outcomes: Progressing  Progress in Treatment: Attending groups: Yes. Participating in groups: Yes. Taking medication as prescribed: Yes. Toleration medication: Yes. Family/Significant other contact made: No, will contact:  fiance Patient understands diagnosis: Yes. Discussing patient identified problems/goals with staff: Yes. Medical problems stabilized or resolved: Yes. Denies suicidal/homicidal ideation: Yes. Issues/concerns per patient self-inventory: Yes.  New problem(s) identified: No, Describe:  No insurance or income  New Short Term/Long Term Goal(s): medication management for mood stabilization; elimination of SI thoughts; development of comprehensive mental wellness/sobriety plan.  Patient Goals: "Get feeling better."  Discharge Plan or Barriers: CSW continuing to assess. MHAG pamphlet, Mobile Crisis informationprovided to patient for additional community support and resources.   Reason for Continuation of Hospitalization: Anxiety Depression Suicidal ideation  Estimated Length of Stay: 3-5 days  Attendees: Patient: Christine Beard 10/30/2018 11:25 AM  Physician:  10/30/2018 11:25 AM  Nursing:  10/30/2018  11:25 AM  RN Care Manager: 10/30/2018 11:25 AM  Social Worker: Enid Cutterharlotte Julann Mcgilvray, LCSWA 10/30/2018 11:25 AM  Recreational Therapist:  10/30/2018 11:25 AM  Other:  10/30/2018 11:25 AM  Other:  10/30/2018 11:25  AM  Other: 10/30/2018 11:25 AM    Scribe for Treatment Team: Darreld Mclean, LCSWA 10/30/2018 11:25 AM

## 2018-10-30 NOTE — Plan of Care (Signed)
Nurse discussed anxiety, depression, coping skills with patient. 

## 2018-10-30 NOTE — Progress Notes (Signed)
Adult Psychoeducational Group Note  Date:  10/30/2018 Time:  8:44 PM  Group Topic/Focus:  Wrap-Up Group:   The focus of this group is to help patients review their daily goal of treatment and discuss progress on daily workbooks.  Participation Level:  Active  Participation Quality:  Appropriate  Affect:  Appropriate  Cognitive:  Appropriate  Insight: Appropriate  Engagement in Group:  Engaged  Modes of Intervention:  Discussion  Additional Comments:  Patient attended group and participated.  Ronee Ranganathan W Ski Polich 10/30/2018, 8:44 PM

## 2018-10-30 NOTE — Progress Notes (Signed)
[facility] MD Progress Note  10/30/2018 1:19 PM Christine Beard  MRN:  [id number] Subjective: patient reports " I had a bad day yesterday, I am feeling a little better today". States she had a panic attack, while in the elevator going to the gym. Also reports she fell yesterday while at the gym, resulting in R wrist pain. States she feels it is less painful today. Denies medication side effects, but states she feels that meds " are still not working ". Denies current suicidal ideations at this time.  Objective: I have discussed case with treatment team and have met with patient. 28 year old female, lives with fianc, has 2 children.  Presented due to worsening depression, neurovegetative symptoms of depression, passive SI.  Stressors include her 27-year-old daughter having chronic medical illness and left eye blindness, financial stressors.  She reported history of good response to fluoxetine in the past.   Patient reports still feeling depressed, but states " I am trying to feel more positive" and states she has made an effort to be more sociable and more interactive with peers. She does present with some improvement and is noted to be better groomed and to have a more reactive affect today. Reports recent passive SI but not today, and currently presents future oriented, expressing interest in seeing a therapist for long term outpatient therapy after discharge. As above, reports fall yesterday while playing ball downstairs at the gym during recreation time. States she slipped and injured her wrist. Wrist X Ray unremarkable, and patient states pain has subsided today, no visible inflammation, and no significant range of motion change. Currently on Prozac and Remeron. Tolerating well thus far, does not endorse side effects. Was examined by FNP for complaints of earache , started on Azythromycin course and on Sudafed PRN. Patient does report some improvement in ear discomfort and does not appear to be in  any acute discomfort .    Principal Problem: Severe recurrent major depression without psychotic features ([place]) Diagnosis: Principal Problem:   Severe recurrent major depression without psychotic features ([place])  Total Time spent with patient: 20 minutes  Past Psychiatric History:   Past Medical History:  Past Medical History:  Diagnosis Date  . Anxiety   . Depression   . Headache   . Hypothyroidism   . S/P tubal ligation 10/06/2017  . SVD (spontaneous vaginal delivery) 10/05/2017  . Thyroid disease     Past Surgical History:  Procedure Laterality Date  . TOE SURGERY    . TONSILLECTOMY    . TUBAL LIGATION N/A 10/06/2017   Procedure: POST PARTUM TUBAL LIGATION;  Surgeon: Janyth Contes, MD;  Location: [facility];  Service: Gynecology;  Laterality: N/A;   Family History:  Family History  Problem Relation Age of Onset  . Diabetes Maternal Grandmother   . Diabetes Paternal Grandmother    Family Psychiatric  History:  Social History:  Social History   Substance and Sexual Activity  Alcohol Use No     Social History   Substance and Sexual Activity  Drug Use No    Social History   Socioeconomic History  . Marital status: Significant Other    Spouse name: Not on file  . Number of children: Not on file  . Years of education: Not on file  . Highest education level: Not on file  Occupational History  . Not on file  Social Needs  . Financial resource strain: Not on file  . Food insecurity:    Worry: Not on file  Inability: Not on file  . Transportation needs:    Medical: Not on file    Non-medical: Not on file  Tobacco Use  . Smoking status: Former Smoker    Last attempt to quit: 09/03/2014    Years since quitting: 4.1  . Smokeless tobacco: Never Used  Substance and Sexual Activity  . Alcohol use: No  . Drug use: No  . Sexual activity: Yes    Birth control/protection: None  Lifestyle  . Physical activity:    Days per week: Not on file     Minutes per session: Not on file  . Stress: Not on file  Relationships  . Social connections:    Talks on phone: Not on file    Gets together: Not on file    Attends religious service: Not on file    Active member of club or organization: Not on file    Attends meetings of clubs or organizations: Not on file    Relationship status: Not on file  Other Topics Concern  . Not on file  Social History Narrative  . Not on file   Additional Social History:    Pain Medications: Denies use Prescriptions: Denies use Over the Counter: Denies use History of alcohol / drug use?: No history of alcohol / drug abuse Longest period of sobriety (when/how long): NA   Sleep: improving   Appetite:  Fair  Current Medications: Current Facility-Administered Medications  Medication Dose Route Frequency Provider Last Rate Last Dose  . acetaminophen (TYLENOL) tablet 650 mg  650 mg Oral Q6H PRN Lindon Romp A, NP   650 mg at 10/30/18 0751  . alum & mag hydroxide-simeth (MAALOX/MYLANTA) 200-200-20 MG/5ML suspension 30 mL  30 mL Oral Q4H PRN Lindon Romp A, NP      . azithromycin (ZITHROMAX) tablet 250 mg  250 mg Oral Daily Derrill Center, NP   250 mg at 10/30/18 0748  . FLUoxetine (PROZAC) capsule 10 mg  10 mg Oral Daily Aryeh Butterfield, Myer Peer, MD   10 mg at 10/30/18 0746  . hydrOXYzine (ATARAX/VISTARIL) tablet 25 mg  25 mg Oral TID PRN Rozetta Nunnery, NP   25 mg at 10/30/18 0751  . magnesium hydroxide (MILK OF MAGNESIA) suspension 30 mL  30 mL Oral Daily PRN Lindon Romp A, NP      . mirtazapine (REMERON) tablet 7.5 mg  7.5 mg Oral QHS Teiara Baria A, MD   7.5 mg at 10/29/18 2110  . neomycin-polymyxin-hydrocortisone (CORTISPORIN) OTIC (EAR) suspension 3 drop  3 drop Left EAR Q8H Vidyuth Belsito, Myer Peer, MD   3 drop at 10/30/18 1300  . pseudoephedrine (SUDAFED) tablet 30 mg  30 mg Oral Q8H PRN Derrill Center, NP   30 mg at 10/30/18 1302    Lab Results:  No results found for this or any previous visit (from the  past 48 hour(s)).  Blood Alcohol level:  No results found for: [facility]  Metabolic Disorder Labs: Lab Results  Component Value Date   HGBA1C 4.5 (L) 10/28/2018   MPG 82.45 10/28/2018   No results found for: PROLACTIN Lab Results  Component Value Date   CHOL 135 10/28/2018   TRIG 236 (H) 10/28/2018   HDL 42 10/28/2018   CHOLHDL 3.2 10/28/2018   VLDL 47 (H) 10/28/2018   LDLCALC 46 10/28/2018    Physical Findings: AIMS:  , ,  ,  ,    CIWA:    COWS:     Musculoskeletal: Strength & Muscle Tone:  within normal limits Gait & Station: normal Patient leans: N/A  Psychiatric Specialty Exam: Physical Exam  ROS no headache, reports left ear discomfort ( partially improved today) , no chest pain, no shortness of breath, reports nausea, no vomiting  Blood pressure 130/88, pulse 85, temperature (!) 97.5 F (36.4 C), temperature source Oral, resp. rate 16, height 1' 10.05" (0.56 m), weight 45.4 kg, last menstrual period 10/04/2018, SpO2 100 %, unknown if currently breastfeeding.Body mass index is 144.65 kg/m.  General Appearance: improved grooming   Eye Contact:  Good  Speech:  Normal Rate  Volume:  Normal  Mood:  reports still feeling depressed but today mood seems partially improved  Affect:  less constricted  Thought Process:  Linear and Descriptions of Associations: Intact  Orientation:  Full (Time, Place, and Person)  Thought Content:  No hallucinations, no delusions  Suicidal Thoughts:  No denies suicidal ideations at this time, contracts for safety on unit  Homicidal Thoughts:  No currently does not endorse homicidal or violent ideations  Memory:  Recent and remote grossly intact  Judgement:  Fair/improving  Insight:  Fair  Psychomotor Activity:  Normal  Concentration:  Concentration: Good and Attention Span: Good  Recall:  Good  Fund of Knowledge:  Good  Language:  Good  Akathisia:  Negative  Handed:  Right  AIMS (if indicated):     Assets:  Desire for  Improvement Resilience  ADL's:  Intact  Cognition:  WNL  Sleep:  Number of Hours: 6   Assessment-  28 year old female, lives with fianc, has 2 children.  Presented due to worsening depression, neurovegetative symptoms of depression, passive SI.  Stressors include her 34-year-old daughter having chronic medical illness and left eye blindness, financial stressors.  She reported history of good response to fluoxetine in the past.   Patient reports persistent depression, but mood and range of affect present partially improved today compared to admission. Denies SI at this time. Thus far tolerating Prozac and Remeron well , no side effects reported .  Treatment Plan Summary: Daily contact with patient to assess and evaluate symptoms and progress in treatment, Medication management, Plan Inpatient treatment and Medications as below Encourage group and milieu participation to work on coping skills and symptom reduction. Continue Prozac 10 mg daily for depression Continue Remeron 7.5 mg nightly for depression and insomnia Continue Azithromycin antibiotic course for Otitis symptoms Continue Vistaril 25 mg every 8 hours PRN for anxiety as needed Corticosporin suspension/drops to left ear to address otic discomfort. Will check EKG to monitor QTc  Treatment team working on disposition planning options. Jenne Campus, MD 10/30/2018, 1:19 PM   Patient ID: [name], female   DOB: 1991/03/02, 28 y.o.   MRN: [id number]

## 2018-10-30 NOTE — [facility] Suicide Risk Assessment (Signed)
[facility] INPATIENT:  Family/Significant Other Suicide Prevention Education  Suicide Prevention Education:  Contact Attempts: Harlan Stains, Marchia Meiers' [phone]) has been identified by the patient as the family member/significant other with whom the patient will be residing, and identified as the person(s) who will aid the patient in the event of a mental health crisis.  With written consent from the patient, two attempts were made to provide suicide prevention education, prior to and/or following the patient's discharge.  We were unsuccessful in providing suicide prevention education.  A suicide education pamphlet was given to the patient to share with family/significant other.  Date and time of first attempt:10/30/2018 /3:08pm  Christine Beard 10/30/2018, 3:09 PM

## 2018-10-30 NOTE — Progress Notes (Signed)
DAR: Pt sent [facility] for x ray to right wrist as ordered by the DR following a fall., pt tolerated well. Pt also complained of feeling hot on her face, V/S taken and was WNL, provider on duty notified. Pt took all her evening meds  With no problem.  Pt's safety ensured with 15 minute and environmental checks. Pt currently denies SI/HI and A/V hallucinations. Pt verbally agrees to seek staff if SI/HI or A/VH occurs and to consult with staff before acting on these thoughts. Will continue POC.

## 2018-10-31 ENCOUNTER — Ambulatory Visit (HOSPITAL_COMMUNITY)
Admit: 2018-10-31 | Discharge: 2018-10-31 | Disposition: A | Discharge status: Home / Self Care | Payer: Medicaid Other | Attending: Psychiatry | Admitting: Psychiatry

## 2018-10-31 DIAGNOSIS — F332 Major depressive disorder, recurrent severe without psychotic features: Principal | ICD-10-CM

## 2018-10-31 DIAGNOSIS — R55 Syncope and collapse: Secondary | ICD-10-CM | POA: Insufficient documentation

## 2018-10-31 MED ORDER — FLUOXETINE HCL 20 MG PO CAPS
20.0000 mg | ORAL_CAPSULE | Freq: Every day | ORAL | Status: DC
  Administered 2018-11-01: 20 mg via ORAL
  Filled 2018-10-31 (×2): qty 1

## 2018-10-31 MED ORDER — LEVOTHYROXINE SODIUM 50 MCG PO TABS
50.0000 ug | ORAL_TABLET | Freq: Every day | ORAL | Status: DC
  Administered 2018-10-31 – 2018-11-03 (×4): 50 ug via ORAL
  Filled 2018-10-31 (×5): qty 1

## 2018-10-31 MED ORDER — MECLIZINE HCL 12.5 MG PO TABS
12.5000 mg | ORAL_TABLET | Freq: Two times a day (BID) | ORAL | Status: DC
  Administered 2018-10-31 – 2018-11-01 (×3): 12.5 mg via ORAL
  Filled 2018-10-31 (×5): qty 1

## 2018-10-31 NOTE — [facility] Group Notes (Signed)
Adult Psychoeducational Group Note  Date:  10/31/2018  Time:  4:15 PM  Group Topic/Focus: Coping Skills Patients are provided a list of 99 coping skills and discuss current/new activities to help manage stress. Patients discuss behaviors they can incorporate to help improve compliance with healthy coping skills and identify areas of strengths/weaknesses.  Participation Level:  Fair  Participation Quality:  Engaged  Affect:  Flat  Cognitive:  Alert and Oriented  Insight: Developing/Improving  Engagement in Group: Supportive  Modes of Intervention:  Discussion, Socialization, Support and Education  Additional Comments:  Patient attended the entire group. Patient contributed to the discussion positively. Patient reports a new coping skills she has identified is "writing poetry".  Marchelle Folks A Kelita Wallis 10/31/2018 5:00 PM

## 2018-10-31 NOTE — Progress Notes (Signed)
D:  Patient's self inventory sheet, patient has poor sleep, sleep medication not helpful.  Poor appetite, low energy level, poor concentration.  Rated depression, hopeless and anxiety 9.  Denied withdrawals.  Denied SI.  Denied physical problems, then checked pain, headaches.  Physical pain, worst pain #9 in past 24 hours, ear, head, body.  Pain medication helpful.  Goal is "the way I think".  Plans "to try to think positive".  No discharge plans.  Work on goals. A:  Medications administered per MD orders.  Emotional support and encouragement given patient. R:  Denied HI.  Denied A/V hallucinations.  SI, no plan, contracts for safety.  Sometimes hears ringing in her ears.

## 2018-10-31 NOTE — Progress Notes (Signed)
Patient's appointment at [facility] is scheduled at 2:30 p.m.  [place] phone [phone] will pick patient up at 2:15 p.m. for transport to [facility] 1st floor of [facility].

## 2018-10-31 NOTE — Progress Notes (Signed)
[facility] MD Progress Note  10/31/2018 9:58 AM SHAWNDREA IRIGOYEN  MRN:  [id number] Subjective: Patient is seen and examined.  Patient is a 28 year old female admitted on 10/28/2018 with depression, suicidal ideation.  Objective: Patient is seen and examined.  Patient is a 28 year old female with a past psychiatric history significant for probable posttraumatic stress disorder as well as major depression.  She is seen in follow-up.  She stated she is essentially unchanged.  She continues on fluoxetine 10 mg p.o. daily and mirtazapine 7.5 mg p.o. nightly.  She stated that her appetite is still poor, and she is not sleeping well.  Nursing notes reflect that she slept 6.25 hours last night.  She stated that she still having ringing in her ears, and that she has had some visual changes.  She stated these have been going on for approximately 6 months.  Review of the electronic medical record showed she had presented with otitis approximately 6 months ago and was treated at that time.  Dr. Jama Flavors had started azithromycin as well as optic drops for possible otitis media.  She stated she had never taken closing before for any of the symptoms.  She also endorsed helplessness, hopelessness and worthlessness.  She stated she was not feeling overly suicidal today.  She denied any side effects to her current medications.  Principal Problem: Severe recurrent major depression without psychotic features (HCC) Diagnosis: Principal Problem:   Severe recurrent major depression without psychotic features (HCC)  Total Time spent with patient: 30 minutes  Past Psychiatric History: See admission H&P  Past Medical History:  Past Medical History:  Diagnosis Date  . Anxiety   . Depression   . Headache   . Hypothyroidism   . S/P tubal ligation 10/06/2017  . SVD (spontaneous vaginal delivery) 10/05/2017  . Thyroid disease     Past Surgical History:  Procedure Laterality Date  . TOE SURGERY    . TONSILLECTOMY    . TUBAL  LIGATION N/A 10/06/2017   Procedure: POST PARTUM TUBAL LIGATION;  Surgeon: Sherian Rein, MD;  Location: [facility];  Service: Gynecology;  Laterality: N/A;   Family History:  Family History  Problem Relation Age of Onset  . Diabetes Maternal Grandmother   . Diabetes Paternal Grandmother    Family Psychiatric  History: See admission H&P Social History:  Social History   Substance and Sexual Activity  Alcohol Use No     Social History   Substance and Sexual Activity  Drug Use No    Social History   Socioeconomic History  . Marital status: Significant Other    Spouse name: Not on file  . Number of children: Not on file  . Years of education: Not on file  . Highest education level: Not on file  Occupational History  . Not on file  Social Needs  . Financial resource strain: Not on file  . Food insecurity:    Worry: Not on file    Inability: Not on file  . Transportation needs:    Medical: Not on file    Non-medical: Not on file  Tobacco Use  . Smoking status: Former Smoker    Last attempt to quit: 09/03/2014    Years since quitting: 4.1  . Smokeless tobacco: Never Used  Substance and Sexual Activity  . Alcohol use: No  . Drug use: No  . Sexual activity: Yes    Birth control/protection: None  Lifestyle  . Physical activity:    Days per week: Not on  file    Minutes per session: Not on file  . Stress: Not on file  Relationships  . Social connections:    Talks on phone: Not on file    Gets together: Not on file    Attends religious service: Not on file    Active member of club or organization: Not on file    Attends meetings of clubs or organizations: Not on file    Relationship status: Not on file  Other Topics Concern  . Not on file  Social History Narrative  . Not on file   Additional Social History:    Pain Medications: Denies use Prescriptions: Denies use Over the Counter: Denies use History of alcohol / drug use?: No history of  alcohol / drug abuse Longest period of sobriety (when/how long): NA                    Sleep: Fair  Appetite:  Poor  Current Medications: Current Facility-Administered Medications  Medication Dose Route Frequency Provider Last Rate Last Dose  . acetaminophen (TYLENOL) tablet 650 mg  650 mg Oral Q6H PRN Nira ConnBerry, Jason A, NP   650 mg at 10/31/18 0819  . alum & mag hydroxide-simeth (MAALOX/MYLANTA) 200-200-20 MG/5ML suspension 30 mL  30 mL Oral Q4H PRN Nira ConnBerry, Jason A, NP      . azithromycin (ZITHROMAX) tablet 250 mg  250 mg Oral Daily Oneta RackLewis, Tanika N, NP   250 mg at 10/31/18 0818  . [START ON 11/01/2018] FLUoxetine (PROZAC) capsule 20 mg  20 mg Oral Daily Antonieta Pertlary,  Lawson, MD      . hydrOXYzine (ATARAX/VISTARIL) tablet 25 mg  25 mg Oral TID PRN Jackelyn PolingBerry, Jason A, NP   25 mg at 10/30/18 2114  . levothyroxine (SYNTHROID, LEVOTHROID) tablet 50 mcg  50 mcg Oral Q0600 Antonieta Pertlary,  Lawson, MD      . magnesium hydroxide (MILK OF MAGNESIA) suspension 30 mL  30 mL Oral Daily PRN Nira ConnBerry, Jason A, NP      . meclizine (ANTIVERT) tablet 12.5 mg  12.5 mg Oral BID Antonieta Pertlary,  Lawson, MD      . mirtazapine (REMERON) tablet 7.5 mg  7.5 mg Oral QHS Cobos, Rockey SituFernando A, MD   7.5 mg at 10/30/18 2114  . neomycin-polymyxin-hydrocortisone (CORTISPORIN) OTIC (EAR) suspension 3 drop  3 drop Left EAR Q8H Cobos, Rockey SituFernando A, MD   3 drop at 10/31/18 [phone] [id number]  . pseudoephedrine (SUDAFED) tablet 30 mg  30 mg Oral Q12H PRN Cobos, Rockey SituFernando A, MD   30 mg at 10/31/18 [id number]    Lab Results: No results found for this or any previous visit (from the past 48 hour(s)).  Blood Alcohol level:  No results found for: [facility]  Metabolic Disorder Labs: Lab Results  Component Value Date   HGBA1C 4.5 (L) 10/28/2018   MPG 82.45 10/28/2018   No results found for: PROLACTIN Lab Results  Component Value Date   CHOL 135 10/28/2018   TRIG 236 (H) 10/28/2018   HDL 42 10/28/2018   CHOLHDL 3.2 10/28/2018   VLDL 47 (H) 10/28/2018   LDLCALC 46  10/28/2018    Physical Findings: AIMS: Facial and Oral Movements Muscles of Facial Expression: None, normal Lips and Perioral Area: None, normal Jaw: None, normal Tongue: None, normal,Extremity Movements Upper (arms, wrists, hands, fingers): None, normal Lower (legs, knees, ankles, toes): None, normal, Trunk Movements Neck, shoulders, hips: None, normal, Overall Severity Severity of abnormal movements (highest score from questions above): None, normal Incapacitation due to  abnormal movements: None, normal Patient's awareness of abnormal movements (rate only patient's report): No Awareness, Dental Status Current problems with teeth and/or dentures?: No Does patient usually wear dentures?: No  CIWA:  CIWA-Ar Total: 1 COWS:  COWS Total Score: 2  Musculoskeletal: Strength & Muscle Tone: within normal limits Gait & Station: normal Patient leans: N/A  Psychiatric Specialty Exam: Physical Exam  Vitals reviewed. Constitutional: She is oriented to person, place, and time. She appears well-developed.  HENT:  Head: Normocephalic and atraumatic.  Respiratory: Effort normal.  Neurological: She is alert and oriented to person, place, and time.    ROS  Blood pressure 111/78, pulse (!) 104, temperature 97.8 F (36.6 C), temperature source Oral, resp. rate 18, height 1' 10.05" (0.56 m), weight 45.4 kg, last menstrual period 10/04/2018, SpO2 100 %, unknown if currently breastfeeding.Body mass index is 144.65 kg/m.  General Appearance: Disheveled  Eye Contact:  Fair  Speech:  Normal Rate  Volume:  Decreased  Mood:  Depressed  Affect:  Congruent  Thought Process:  Coherent and Descriptions of Associations: Intact  Orientation:  Full (Time, Place, and Person)  Thought Content:  Logical  Suicidal Thoughts:  Yes.  without intent/plan  Homicidal Thoughts:  No  Memory:  Immediate;   Fair Recent;   Fair Remote;   Fair  Judgement:  Intact  Insight:  Fair  Psychomotor Activity:  Decreased   Concentration:  Concentration: Fair and Attention Span: Fair  Recall:  [employer]Fair  Fund of Knowledge:  Good  Language:  Fair  Akathisia:  Negative  Handed:  Right  AIMS (if indicated):     Assets:  Communication Skills Desire for Improvement Housing Resilience Social Support  ADL's:  Intact  Cognition:  WNL  Sleep:  Number of Hours: 6.25     Treatment Plan Summary: Daily contact with patient to assess and evaluate symptoms and progress in treatment, Medication management and Plan : Patient is seen and examined.  Patient is a 28 year old female with a past psychiatric history significant for probable posttraumatic stress disorder as well as major depression, recurrent.  She is seen in follow-up.  It appears as though she is basically unchanged today.  She still remains significantly depressed.  I will increase her fluoxetine to 20 mg p.o. daily.  We will continue the mirtazapine at 7.5 mg p.o. nightly for now.  Unfortunately it does not appear as though her Synthroid was continued after admission, and I will restart her Synthroid at 50 mcg p.o. daily.  She also continues to complain of ringing in her ears as well as visual changes.  I am going to order a noncontrasted CT scan of the brain for that currently.  She may need an MRI if we suspect multiple sclerosis or Mnire's.  Right now we will try and treat her symptomatically with meclizine. 1.  Increase fluoxetine to 20 mg p.o. daily for depression. 2.  Continue mirtazapine 7.5 mg p.o. nightly for depression, sleep and appetite stimulation. 3.  Restart Synthroid 50 mcg p.o. daily for hypothyroidism. 4.  Continue azithromycin as well as Cortisporin attic eardrops for possible otitis media. 5.  Continue hydroxyzine 25 mg p.o. 3 times daily as needed anxiety. 6.  Meclizine 12.5 mg p.o. twice daily for ringing in the ears, dizziness. 7.  CT scan of the brain without contrast given her neurological symptoms. 8.  Disposition planning-in  progress.  Antonieta PertGreg Lawson , MD 10/31/2018, 9:58 AM

## 2018-10-31 NOTE — Progress Notes (Signed)
Recreation Therapy Notes  Animal-Assisted Activity (AAA) Program Checklist/Progress Notes Patient Eligibility Criteria Checklist & Daily Group note for Rec Tx Intervention  Date: 1.28.20 Time: 1430 Location: [address]   AAA/T Program Assumption of Risk Form signed by Engineer, production or Parent Legal Guardian  YES   Patient is free of allergies or sever asthma  YES   Patient reports no fear of animals  YES   Patient reports no history of cruelty to animals  YES   Patient understands his/her participation is voluntary YES   Patient washes hands before animal contact  YES   Patient washes hands after animal contact  YES   Behavioral Response:  Engaged  Education: Charity fundraiser, Appropriate Animal Interaction   Education Outcome: Acknowledges understanding/In group clarification offered/Needs additional education.   Clinical Observations/Feedback: Pt attended and participated.    Caroll Rancher, LRT/CTRS         Caroll Rancher A 10/31/2018 3:36 PM

## 2018-10-31 NOTE — Progress Notes (Signed)
Adult Psychoeducational Group Note  Date:  10/31/2018 Time:  8:56 PM  Group Topic/Focus:  Wrap-Up Group:   The focus of this group is to help patients review their daily goal of treatment and discuss progress on daily workbooks.  Participation Level:  Active  Participation Quality:  Appropriate  Affect:  Appropriate  Cognitive:  Disorganized  Insight: Appropriate  Engagement in Group:  Engaged  Modes of Intervention:  Discussion  Additional Comments:  Patient attended group and said that her day was a 4. Her positive word for today was O.K. cause she had a better day today than yesterday.  Richey Doolittle W Shadaya Marschner 10/31/2018, 8:56 PM

## 2018-10-31 NOTE — Progress Notes (Signed)
Patient went to [facility] for CT of head.  Patient returned and played with [facility] the dog in recreation.  Patient stated her anxiety increased by having to go to [facility] for CT scan.

## 2018-10-31 NOTE — Plan of Care (Signed)
Nurse discussed anxiety, depression and coping skills. 

## 2018-10-31 NOTE — [facility] Group Notes (Signed)
LCSW Group Therapy Note  10/31/2018 1:01 PM  Type of Therapy/Topic: Group Therapy: Feelings about Diagnosis  Participation Level: Did Not Attend   Description of Group:  This group will allow patients to explore their thoughts and feelings about diagnoses they have received. Patients will be guided to explore their level of understanding and acceptance of these diagnoses. Facilitator will encourage patients to process their thoughts and feelings about the reactions of others to their diagnosis and will guide patients in identifying ways to discuss their diagnosis with significant others in their lives. This group will be process-oriented, with patients participating in exploration of their own experiences, giving and receiving support, and processing challenge from other group members.  Therapeutic Goals: 1. Patient will demonstrate understanding of diagnosis as evidenced by identifying two or more symptoms of the disorder 2. Patient will be able to express two feelings regarding the diagnosis 3. Patient will demonstrate their ability to communicate their needs through discussion and/or role play  Summary of Patient Progress: Patient invited to attend group; chose not to.    Therapeutic Modalities:  Cognitive Behavioral Therapy Brief Therapy Feelings Identification   Enid Cutter, MSW, [facility] Clinical Social Worker

## 2018-10-31 NOTE — [facility] Suicide Risk Assessment (Signed)
[facility] INPATIENT:  Family/Significant Other Suicide Prevention Education  Suicide Prevention Education:  Contact Attempts: Harlan StainsJoshua Murray, Marchia Meiersfiance' [phone]) has been identified by the patient as the family member/significant other with whom the patient will be residing, and identified as the person(s) who will aid the patient in the event of a mental health crisis.  With written consent from the patient, two attempts were made to provide suicide prevention education, prior to and/or following the patient's discharge.  We were unsuccessful in providing suicide prevention education.  A suicide education pamphlet was given to the patient to share with family/significant other.  Date and time of first attempt: 10/31/2018 / 10:07am    Maeola SarahJolan E Fabiana Dromgoole 10/31/2018, 10:07 AM

## 2018-11-01 MED ORDER — MIRTAZAPINE 15 MG PO TABS
15.0000 mg | ORAL_TABLET | Freq: Every day | ORAL | Status: DC
  Administered 2018-11-01 – 2018-11-04 (×3): 15 mg via ORAL
  Filled 2018-11-01 (×3): qty 1
  Filled 2018-11-01 (×2): qty 7
  Filled 2018-11-01: qty 1

## 2018-11-01 MED ORDER — ENSURE ENLIVE PO LIQD
237.0000 mL | Freq: Two times a day (BID) | ORAL | Status: DC
  Administered 2018-11-01 – 2018-11-04 (×6): 237 mL via ORAL

## 2018-11-01 MED ORDER — FLUOXETINE HCL 20 MG PO CAPS
40.0000 mg | ORAL_CAPSULE | Freq: Every day | ORAL | Status: DC
  Administered 2018-11-02 – 2018-11-04 (×3): 40 mg via ORAL
  Filled 2018-11-01: qty 2
  Filled 2018-11-01: qty 14
  Filled 2018-11-01 (×4): qty 2
  Filled 2018-11-01: qty 14

## 2018-11-01 MED ORDER — FLUOXETINE HCL 20 MG PO CAPS
20.0000 mg | ORAL_CAPSULE | Freq: Once | ORAL | Status: AC
  Administered 2018-11-01: 20 mg via ORAL
  Filled 2018-11-01 (×2): qty 1

## 2018-11-01 MED ORDER — MECLIZINE HCL 25 MG PO TABS
25.0000 mg | ORAL_TABLET | Freq: Two times a day (BID) | ORAL | Status: DC
  Administered 2018-11-01 – 2018-11-03 (×4): 25 mg via ORAL
  Filled 2018-11-01 (×6): qty 1

## 2018-11-01 NOTE — Progress Notes (Signed)
Patient ID: Christine BeginPatricia A Beard, female   DOB: December 11, 1990, 28 y.o.   MRN: [id number]   D: Patient pleasant on approach tonight. Reports still having issues with sleeping and appetite today. Did report eating vegetables today. Reports depression and some passive SI on and off. No active SI at this time. Interacting well on the unit. A: Staff will continue to monitor on q 15 minutes, follow treatment plan, and give medications as needed. R: Took medication and went to bed.

## 2018-11-01 NOTE — Progress Notes (Signed)
D: Pt was in dayroom upon initial approach.  Pt presents with anxious affect and mood.  She describes her day as "okay" and reports "my ear has been bothering me a lot."  Her goal is "to just relax."  Pt denies SI/HI, denies hallucinations, reports L ear pain of 6/10.  Pt has been visible in milieu interacting with peers and staff appropriately.  Pt attended evening group.    A: Introduced self to pt.  Met with pt 1:1.  Actively listened to pt and offered support and encouragement.  Medications administered per order.  PRN medication administered for pain and anxiety.  Fall prevention techniques reviewed with pt and she verbalized understanding.  Q15 minute safety checks maintained.  R: Pt is safe on the unit.  Pt is compliant with medications.  Pt verbally contracts for safety.  Will continue to monitor and assess.

## 2018-11-01 NOTE — Progress Notes (Addendum)
Patient ID: [name], female   DOB: 09-24-1991, 28 y.o.   MRN: [id number]  Pt currently presents with a blunted affect and restless behavior. Pt reports to writer that their goal is to "think positive." Pt states "talk about things that's bothering me." Pt has multiple somatic complaints today. Expresses ongoing dizziness, vertigo, nausea and anxiety. Pt BP WDL. Pt reports fair  sleep with current medication regimen. Rates anxiety at a 9/10. Depression and hopelessness are a 9/10 as well. Reports she has not been eating the past couple of days, also reports throwing up after breakfast.   Pt provided with medications per providers orders. Pt's labs and vitals were monitored throughout the night. Pt supported emotionally and encouraged to express concerns and questions. Pt educated on medications and alternative treatment for vertigo.   Pt's safety ensured with 15 minute and environmental checks. Pt currently denies SI/HI and A/V hallucinations. Pt verbally agrees to seek staff if SI/HI or A/VH occurs and to consult with staff before acting on any harmful thoughts. Pt was seen eating meals today and got suplemental nutrition from Clinical research associate.Will continue POC.

## 2018-11-01 NOTE — Progress Notes (Signed)
[facility] MD Progress Note  11/01/2018 2:07 PM Christine Beard  MRN:  [id number] Subjective:  Patient is seen and examined.  Patient is a 28 year old female admitted on 10/28/2018 with depression, suicidal ideation.  Objective: Patient is seen and examined.  Patient is a 28 year old female with a past psychiatric history significant for probable posttraumatic stress disorder as well as major depression.  She is seen in follow-up.  She continues to complain of dizziness and fullness in her ear.  She also stated she feels like her mood and anxiety are the same.  We discussed working on her coping skills today.  She remains very somatic.  Her CT scan of the brain was essentially negative.  She will most likely need neuro consultation and follow-up after discharge.  We discussed working on her coping skills, increasing her fluoxetine, and increasing her mirtazapine.  Her vital signs are stable, she is afebrile.  She slept 6.5 hours last night.  Principal Problem: Severe recurrent major depression without psychotic features (HCC) Diagnosis: Principal Problem:   Severe recurrent major depression without psychotic features (HCC)  Total Time spent with patient: 15 minutes  Past Psychiatric History: See admission H&P  Past Medical History:  Past Medical History:  Diagnosis Date  . Anxiety   . Depression   . Headache   . Hypothyroidism   . S/P tubal ligation 10/06/2017  . SVD (spontaneous vaginal delivery) 10/05/2017  . Thyroid disease     Past Surgical History:  Procedure Laterality Date  . TOE SURGERY    . TONSILLECTOMY    . TUBAL LIGATION N/A 10/06/2017   Procedure: POST PARTUM TUBAL LIGATION;  Surgeon: Sherian Rein, MD;  [facility]: [facility];  Service: Gynecology;  Laterality: N/A;   Family History:  Family History  Problem Relation Age of Onset  . Diabetes Maternal Grandmother   . Diabetes Paternal Grandmother    Family Psychiatric  History: See admission H&P Social History:   Social History   Substance and Sexual Activity  Alcohol Use No     Social History   Substance and Sexual Activity  Drug Use No    Social History   Socioeconomic History  . Marital status: Significant Other    Spouse name: Not on file  . Number of children: Not on file  . Years of education: Not on file  . Highest education level: Not on file  Occupational History  . Not on file  Social Needs  . Financial resource strain: Not on file  . Food insecurity:    Worry: Not on file    Inability: Not on file  . Transportation needs:    Medical: Not on file    Non-medical: Not on file  Tobacco Use  . Smoking status: Former Smoker    Last attempt to quit: 09/03/2014    Years since quitting: 4.1  . Smokeless tobacco: Never Used  Substance and Sexual Activity  . Alcohol use: No  . Drug use: No  . Sexual activity: Yes    Birth control/protection: None  Lifestyle  . Physical activity:    Days per week: Not on file    Minutes per session: Not on file  . Stress: Not on file  Relationships  . Social connections:    Talks on phone: Not on file    Gets together: Not on file    Attends religious service: Not on file    Active member of club or organization: Not on file    Attends meetings  of clubs or organizations: Not on file    Relationship status: Not on file  Other Topics Concern  . Not on file  Social History Narrative  . Not on file   Additional Social History:    Pain Medications: Denies use Prescriptions: Denies use Over the Counter: Denies use History of alcohol / drug use?: No history of alcohol / drug abuse Longest period of sobriety (when/how long): NA                    Sleep: Good  Appetite:  Fair  Current Medications: Current Facility-Administered Medications  Medication Dose Route Frequency Provider Last Rate Last Dose  . acetaminophen (TYLENOL) tablet 650 mg  650 mg Oral Q6H PRN Nira Conn A, NP   650 mg at 11/01/18 0749  . alum & mag  hydroxide-simeth (MAALOX/MYLANTA) 200-200-20 MG/5ML suspension 30 mL  30 mL Oral Q4H PRN Nira Conn A, NP      . azithromycin (ZITHROMAX) tablet 250 mg  250 mg Oral Daily Oneta Rack, NP   250 mg at 11/01/18 0748  . feeding supplement (ENSURE ENLIVE) (ENSURE ENLIVE) liquid 237 mL  237 mL Oral BID BM Antonieta Pert, MD   237 mL at 11/01/18 0830  . FLUoxetine (PROZAC) capsule 20 mg  20 mg Oral Once Antonieta Pert, MD      . Melene Muller ON 11/02/2018] FLUoxetine (PROZAC) capsule 40 mg  40 mg Oral Daily Antonieta Pert, MD      . hydrOXYzine (ATARAX/VISTARIL) tablet 25 mg  25 mg Oral TID PRN Nira Conn A, NP   25 mg at 10/31/18 1540  . levothyroxine (SYNTHROID, LEVOTHROID) tablet 50 mcg  50 mcg Oral Q0600 Antonieta Pert, MD   50 mcg at 11/01/18 [phone]  . magnesium hydroxide (MILK OF MAGNESIA) suspension 30 mL  30 mL Oral Daily PRN Nira Conn A, NP      . meclizine (ANTIVERT) tablet 25 mg  25 mg Oral BID Antonieta Pert, MD      . mirtazapine (REMERON) tablet 15 mg  15 mg Oral QHS Antonieta Pert, MD      . neomycin-polymyxin-hydrocortisone (CORTISPORIN) OTIC (EAR) suspension 3 drop  3 drop Left EAR Q8H Cobos, Rockey Situ, MD   3 drop at 11/01/18 1337  . pseudoephedrine (SUDAFED) tablet 30 mg  30 mg Oral Q12H PRN Cobos, Rockey Situ, MD   30 mg at 11/01/18 1694    Lab Results: No results found for this or any previous visit (from the past 48 hour(s)).  Blood Alcohol level:  No results found for: [facility]  Metabolic Disorder Labs: Lab Results  Component Value Date   HGBA1C 4.5 (L) 10/28/2018   MPG 82.45 10/28/2018   No results found for: PROLACTIN Lab Results  Component Value Date   CHOL 135 10/28/2018   TRIG 236 (H) 10/28/2018   HDL 42 10/28/2018   CHOLHDL 3.2 10/28/2018   VLDL 47 (H) 10/28/2018   LDLCALC 46 10/28/2018    Physical Findings: AIMS: Facial and Oral Movements Muscles of Facial Expression: None, normal Lips and Perioral Area: None, normal Jaw: None,  normal Tongue: None, normal,Extremity Movements Upper (arms, wrists, hands, fingers): None, normal Lower (legs, knees, ankles, toes): None, normal, Trunk Movements Neck, shoulders, hips: None, normal, Overall Severity Severity of abnormal movements (highest score from questions above): None, normal Incapacitation due to abnormal movements: None, normal Patient's awareness of abnormal movements (rate only patient's report): No Awareness, Dental Status  Current problems with teeth and/or dentures?: No Does patient usually wear dentures?: No  CIWA:  CIWA-Ar Total: 1 COWS:  COWS Total Score: 2  Musculoskeletal: Strength & Muscle Tone: within normal limits Gait & Station: normal Patient leans: N/A  Psychiatric Specialty Exam: Physical Exam  Nursing note and vitals reviewed. Constitutional: She is oriented to person, place, and time. She appears well-developed and well-nourished.  HENT:  Head: Normocephalic and atraumatic.  Respiratory: Effort normal.  Neurological: She is alert and oriented to person, place, and time.    ROS  Blood pressure 130/84, pulse 83, temperature 98 F (36.7 C), temperature source Oral, resp. rate 18, height 1' 10.05" (0.56 m), weight 45.4 kg, last menstrual period 10/04/2018, SpO2 100 %, unknown if currently breastfeeding.Body mass index is 144.65 kg/m.  General Appearance: Casual  Eye Contact:  Fair  Speech:  Normal Rate  Volume:  Normal  Mood:  Anxious  Affect:  Congruent  Thought Process:  Coherent and Descriptions of Associations: Intact  Orientation:  Full (Time, Place, and Person)  Thought Content:  Logical  Suicidal Thoughts:  No  Homicidal Thoughts:  No  Memory:  Immediate;   Fair Recent;   Fair Remote;   Fair  Judgement:  Intact  Insight:  Fair  Psychomotor Activity:  Normal  Concentration:  Concentration: Fair and Attention Span: Fair  Recall:  [employer]  Fund of Knowledge:  Fair  Language:  Fair  Akathisia:  Negative  Handed:  Right  AIMS  (if indicated):     Assets:  Desire for Improvement Housing Physical Health Resilience  ADL's:  Intact  Cognition:  WNL  Sleep:  Number of Hours: 6.5     Treatment Plan Summary: Daily contact with patient to assess and evaluate symptoms and progress in treatment, Medication management and Plan : Patient is seen and examined.  Patient is a 28 year old female with a past psychiatric history significant for probable posttraumatic stress disorder as well as major depression, recurrent.  She is seen in follow-up.  She is essentially unchanged.  I am going to increase her fluoxetine to 40 mg p.o. daily, increase her mirtazapine to 15 mg p.o. nightly, increase her meclizine to 25 mg p.o. twice daily.  Hopefully this will begin to help her get a grip on her anxiety.  Her CT scan of the brain was essentially negative, but she may need an MRI afterwards. 1.  Increase fluoxetine to 40 mg p.o. daily for depression and anxiety. 2.  Increase mirtazapine to 15 mg p.o. nightly for depression, sleep and appetite stimulation. 3.  Continue Synthroid 50 mcg p.o. daily for hypothyroidism. 4.  No change in azithromycin as well as Cortisporin optic eardrops for possible otitis media. 5.  Continue hydroxyzine 25 mg p.o. 3 times daily as needed anxiety. 6.  Increase meclizine to 25 mg p.o. twice daily for ringing in the ears and dizziness. 7.  Disposition planning-in progress.  Antonieta PertGreg Lawson Klara Stjames, MD 11/01/2018, 2:07 PM

## 2018-11-01 NOTE — Therapy (Signed)
Occupational Therapy Group Note  Date:  11/01/2018 Time:  11:36 AM  Group Topic/Focus:  Self Esteem Action Plan:   The focus of this group is to help patients create a plan to continue to build self-esteem after discharge.  Participation Level:  Active  Participation Quality:  Appropriate  Affect:  Flat  Cognitive:  Appropriate  Insight: Improving  Engagement in Group:  Engaged  Modes of Intervention:  Activity, Discussion, Education and Socialization  Additional Comments:    S: "I  Have been put down my whole life and nothing makes me feel positive"  O: OT tx with focus on self esteem building this date. Education given on definition of self esteem, with both causes of low and high self esteem identified. Activity given for pt to identify a positive/aspiring trait for each letter of the alphabet. Pt to work with peers to help complete activity and build positive thinking.   A: Pt presents to group with flat/depressed affect, engaged and participatory throughout session. Pt engaged in self esteem discussion, by stating that put downs have been the biggest effect on her negative self esteem. She is unable to name any positives, but with cueing is able to name crocheting. Pt engaged in A-Z activity with max VC's, not completing entirely.  P: Education given on self esteem and how to improve thi  Dalphine Handing, MSOT, OTR/L Behavioral Health OT/ Acute Relief OT PHP Office: [phone]  Dalphine Handing 11/01/2018, 11:36 AM

## 2018-11-02 NOTE — Progress Notes (Signed)
Adult Psychoeducational Group Note  Date:  11/02/2018 Time:  9:49 PM  Group Topic/Focus:  Wrap-Up Group:   The focus of this group is to help patients review their daily goal of treatment and discuss progress on daily workbooks.  Participation Level:  Minimal  Participation Quality:  Appropriate  Affect:  Flat  Cognitive:  Alert and Oriented  Insight: Limited  Engagement in Group:  Limited  Modes of Intervention:  Confrontation, Exploration and Support  Additional Comments:  Pt verbalized that her day was a 2. Pt stated that something positive was that she was able to get through the day. Pt verbalized that her gaol was to talk more.   Alyjah Lovingood, Randal Buba 11/02/2018, 9:49 PM

## 2018-11-02 NOTE — B[facility] Group Notes (Signed)
Type of Therapy: Mental Health Association Presentation  Participation Level: Did Not Attend   Summary of Progress/Problems: Mental Health Association [facility]) Speaker came to talk about his personal journey with mental health. The pt processed ways by which to relate to the speaker. MHA speaker provided handouts and educational information pertaining to groups and services offered by the [facility]. Pt was engaged in speaker's presentation and was receptive to resources provided.   Invited, chose not to attend.    Christine Beard, MSW, LCSWA Clinical Social Worker [facility]  Phone: [phone]

## 2018-11-02 NOTE — Progress Notes (Signed)
[facility] MD Progress Note  11/02/2018 1:40 PM Christine Beard  MRN:  [id number] Subjective:  Patient is seen and examined. Patient is a 28 year old female admitted on 10/28/2018 with depression, suicidal ideation.  Objective: Patient is seen and examined.  Patient is a 28 year old female with a past psychiatric history significant for probable posttraumatic stress disorder as well as major depression.  She is seen in follow-up.  She stated she slept better last night.  She was sleeping a little bit late this morning.  She stated she was feeling better.  Last night she continued to have concerns about her ear.  We had discussed previously about referral to an ENT physician as well as possible neurology evaluation.  We discussed her possible discharge tomorrow, and then she came back and said her family would not be able to pick her up until Saturday and I was in agreement with that.  Shortly after that staff had to console her because she got into an argument on the telephone with her boyfriend.  Shortly after that she reported to the nursing staff that she was now suicidal.  Her blood pressure stable, she was mildly tachycardic later this a.m.  Nursing notes reflected that she slept more than 6 hours.  Prior to discussing her discharge she had denied suicidality, but now she is claiming to be suicidal.  Principal Problem: Severe recurrent major depression without psychotic features (HCC) Diagnosis: Principal Problem:   Severe recurrent major depression without psychotic features (HCC)  Total Time spent with patient: 30 minutes  Past Psychiatric History: See admission H&P  Past Medical History:  Past Medical History:  Diagnosis Date  . Anxiety   . Depression   . Headache   . Hypothyroidism   . S/P tubal ligation 10/06/2017  . SVD (spontaneous vaginal delivery) 10/05/2017  . Thyroid disease     Past Surgical History:  Procedure Laterality Date  . TOE SURGERY    . TONSILLECTOMY    . TUBAL  LIGATION N/A 10/06/2017   Procedure: POST PARTUM TUBAL LIGATION;  Surgeon: Sherian Rein, MD;  Location: [facility];  Service: Gynecology;  Laterality: N/A;   Family History:  Family History  Problem Relation Age of Onset  . Diabetes Maternal Grandmother   . Diabetes Paternal Grandmother    Family Psychiatric  History: An H&P Social History:  Social History   Substance and Sexual Activity  Alcohol Use No     Social History   Substance and Sexual Activity  Drug Use No    Social History   Socioeconomic History  . Marital status: Significant Other    Spouse name: Not on file  . Number of children: Not on file  . Years of education: Not on file  . Highest education level: Not on file  Occupational History  . Not on file  Social Needs  . Financial resource strain: Not on file  . Food insecurity:    Worry: Not on file    Inability: Not on file  . Transportation needs:    Medical: Not on file    Non-medical: Not on file  Tobacco Use  . Smoking status: Former Smoker    Last attempt to quit: 09/03/2014    Years since quitting: 4.1  . Smokeless tobacco: Never Used  Substance and Sexual Activity  . Alcohol use: No  . Drug use: No  . Sexual activity: Yes    Birth control/protection: None  Lifestyle  . Physical activity:    Days per  week: Not on file    Minutes per session: Not on file  . Stress: Not on file  Relationships  . Social connections:    Talks on phone: Not on file    Gets together: Not on file    Attends religious service: Not on file    Active member of club or organization: Not on file    Attends meetings of clubs or organizations: Not on file    Relationship status: Not on file  Other Topics Concern  . Not on file  Social History Narrative  . Not on file   Additional Social History:    Pain Medications: Denies use Prescriptions: Denies use Over the Counter: Denies use History of alcohol / drug use?: No history of alcohol / drug  abuse Longest period of sobriety (when/how long): NA                    Sleep: Good  Appetite:  Good  Current Medications: Current Facility-Administered Medications  Medication Dose Route Frequency Provider Last Rate Last Dose  . acetaminophen (TYLENOL) tablet 650 mg  650 mg Oral Q6H PRN Nira ConnBerry, Jason A, NP   650 mg at 11/02/18 0837  . alum & mag hydroxide-simeth (MAALOX/MYLANTA) 200-200-20 MG/5ML suspension 30 mL  30 mL Oral Q4H PRN Nira ConnBerry, Jason A, NP      . feeding supplement (ENSURE ENLIVE) (ENSURE ENLIVE) liquid 237 mL  237 mL Oral BID BM Antonieta Pertlary, Greg Lawson, MD   237 mL at 11/02/18 0834  . FLUoxetine (PROZAC) capsule 40 mg  40 mg Oral Daily Antonieta Pertlary, Greg Lawson, MD   40 mg at 11/02/18 [id number]  . hydrOXYzine (ATARAX/VISTARIL) tablet 25 mg  25 mg Oral TID PRN Jackelyn PolingBerry, Jason A, NP   25 mg at 11/01/18 2111  . levothyroxine (SYNTHROID, LEVOTHROID) tablet 50 mcg  50 mcg Oral Q0600 Antonieta Pertlary, Greg Lawson, MD   50 mcg at 11/02/18 [phone]  . magnesium hydroxide (MILK OF MAGNESIA) suspension 30 mL  30 mL Oral Daily PRN Nira ConnBerry, Jason A, NP      . meclizine (ANTIVERT) tablet 25 mg  25 mg Oral BID Antonieta Pertlary, Greg Lawson, MD   25 mg at 11/02/18 [phone]  . mirtazapine (REMERON) tablet 15 mg  15 mg Oral QHS Antonieta Pertlary, Greg Lawson, MD   15 mg at 11/01/18 2111  . neomycin-polymyxin-hydrocortisone (CORTISPORIN) OTIC (EAR) suspension 3 drop  3 drop Left EAR Q8H Cobos, Rockey SituFernando A, MD   3 drop at 11/02/18 [phone]  . pseudoephedrine (SUDAFED) tablet 30 mg  30 mg Oral Q12H PRN Cobos, Rockey SituFernando A, MD   30 mg at 11/02/18 [phone]    Lab Results: No results found for this or any previous visit (from the past 48 hour(s)).  Blood Alcohol level:  No [facility] found for: [facility]  Metabolic Disorder Labs: Lab Results  Component Value Date   HGBA1C 4.5 (L) 10/28/2018   MPG 82.45 10/28/2018   No results found for: PROLACTIN Lab Results  Component Value Date   CHOL 135 10/28/2018   TRIG 236 (H) 10/28/2018   HDL 42 10/28/2018   CHOLHDL 3.2  10/28/2018   VLDL 47 (H) 10/28/2018   LDLCALC 46 10/28/2018    Physical Findings: AIMS: Facial and Oral Movements Muscles of Facial Expression: None, normal Lips and Perioral Area: None, normal Jaw: None, normal Tongue: None, normal,Extremity Movements Upper (arms, wrists, hands, fingers): None, normal Lower (legs, knees, ankles, toes): None, normal, Trunk Movements Neck, shoulders, hips: None, normal, Overall Severity  Severity of abnormal movements (highest score from questions above): None, normal Incapacitation due to abnormal movements: None, normal Patient's awareness of abnormal movements (rate only patient's report): No Awareness, Dental Status Current problems with teeth and/or dentures?: No Does patient usually wear dentures?: No  CIWA:  CIWA-Ar Total: 1 COWS:  COWS Total Score: 2  Musculoskeletal: Strength & Muscle Tone: within normal limits Gait & Station: normal Patient leans: N/A  Psychiatric Specialty Exam: Physical Exam  Nursing note and vitals reviewed. Constitutional: She appears well-developed and well-nourished.  HENT:  Head: Normocephalic and atraumatic.  Respiratory: Effort normal.  Neurological: She is alert.    ROS  Blood pressure 114/81, pulse (!) 119, temperature 98 F (36.7 C), temperature source Oral, resp. rate 18, height 1' 10.05" (0.56 m), weight 45.4 kg, last menstrual period 10/04/2018, SpO2 100 %, unknown if currently breastfeeding.Body mass index is 144.65 kg/m.  General Appearance: Casual  Eye Contact:  Fair  Speech:  Normal Rate  Volume:  Normal  Mood:  Anxious  Affect:  Congruent  Thought Process:  Coherent and Descriptions of Associations: Intact  Orientation:  Full (Time, Place, and Person)  Thought Content:  Logical  Suicidal Thoughts:  Yes.  without intent/plan  Homicidal Thoughts:  No  Memory:  Immediate;   Fair Recent;   Fair Remote;   Fair  Judgement:  Impaired  Insight:  Lacking  Psychomotor Activity:  Increased   Concentration:  Concentration: Fair and Attention Span: Fair  Recall:  [employer] of Knowledge:  Fair  Language:  Fair  Akathisia:  Negative  Handed:  Right  AIMS (if indicated):     Assets:  Desire for Improvement Housing Physical Health  ADL's:  Intact  Cognition:  WNL  Sleep:  Number of Hours: 6.5     Treatment Plan Summary: Daily contact with patient to assess and evaluate symptoms and progress in treatment, Medication management and Plan : Patient is seen and examined.  Patient is a 28 year old female with a past psychiatric history significant for probable posttraumatic stress disorder as well as major depression, recurrent.  She is seen in follow-up.  Prior to discussion of her discharge patient was doing fine.  She is not having any suicidal ideation.  Shortly after we began to discuss her discharge she became agitated on the telephone with her boyfriend, and then announced to the nursing staff that she was suicidal.  I do not believe her to be suicidal, and I am not really clear on why she would not want to be discharged home to assist with the care of her child.  We will have social work begin to schedule follow-up as aftercare after her discharge.  Hopefully we can explore why she does not want to go home. 1.  Continue fluoxetine 40 mg p.o. daily for depression and anxiety. 2.  Continue mirtazapine 15 mg p.o. nightly for depression, sleep and appetite stimulation. 3.  Continue Synthroid 50 mcg p.o. daily for hypothyroidism. 4.  No change in azithromycin as well as Cortisporin attic eardrops for possible otitis media. 5.  Continue hydroxyzine 25 mg p.o. 3 times daily as needed for anxiety. 6.  Continue meclizine 25 mg p.o. twice daily for ringing in the ears and dizziness. 7.  Disposition planning-in progress.  Antonieta Pert, MD 11/02/2018, 1:40 PM

## 2018-11-02 NOTE — Progress Notes (Signed)
D: Pt was in bed in her room upon initial approach.  Pt presents with anxious affect and mood.  She describes her day as "crappy because of a phone call."  Her goal is "to relax."  Pt denies SI/HI, denies hallucinations, reports L ear pain of 6/10.  Pt has been visible in milieu interacting with peers and staff appropriately.  Pt attended evening group.    A: Met with pt 1:1.  Actively listened to pt and offered support and encouragement. Medications administered per order.  PRN medication administered for anxiety and pain.  Q15 minute safety checks maintained.  R: Pt is safe on the unit.  Pt is compliant with medications.  Pt verbally contracts for safety.  Will continue to monitor and assess.

## 2018-11-02 NOTE — Progress Notes (Signed)
D: Patient was complaining to one of her peers this morning.  She was concerned about her discharge tomorrow.  Patient came up to this staff member and states, "I'm feeling really suicidal right now and I thought you needed to know."  I asked her when she started feeling this way and she states, "earlier today."  Patient is able to contract for safety.  I explained to her that if those feelings became overwhelming to the point she could not stay safe, she needs to let staff know.  Also relayed the information to MD.  Patient has been in the bed most of the afternoon; she is now up interacting with her peers.  A: Continue to monitor medication management and MD orders.  Safety checks completed every 15 minutes per protocol.  Offer support and encouragement as needed.  R: Patient is receptive to staff; his/her behavior is appropriate.

## 2018-11-02 NOTE — [facility] Suicide Risk Assessment (Signed)
[facility] INPATIENT:  Family/Significant Other Suicide Prevention Education  Suicide Prevention Education:  Education Completed; Harlan Stains, fiance' [phone]) has been identified by the patient as the family member/significant other with whom the patient will be residing, and identified as the person(s) who will aid the patient in the event of a mental health crisis (suicidal ideations/suicide attempt).  With written consent from the patient, the family member/significant other has been provided the following suicide prevention education, prior to the and/or following the discharge of the patient.  The suicide prevention education provided includes the following:  Suicide risk factors  Suicide prevention and interventions  National Suicide Hotline telephone number  [facility] assessment telephone number  [facility] Emergency Assistance 911  [facility] and/or Residential Mobile Crisis Unit telephone number  Request made of family/significant other to:  Remove weapons (e.g., guns, rifles, knives), all items previously/currently identified as safety concern.    Remove drugs/medications (over-the-counter, prescriptions, illicit drugs), all items previously/currently identified as a safety concern.  The family member/significant other verbalizes understanding of the suicide prevention education information provided.  The family member/significant other agrees to remove the items of safety concern listed above.  Maeola Sarah 11/02/2018, 2:30 PM

## 2018-11-03 MED ORDER — MECLIZINE HCL 25 MG PO TABS
25.0000 mg | ORAL_TABLET | Freq: Three times a day (TID) | ORAL | Status: DC | PRN
  Administered 2018-11-04: 25 mg via ORAL
  Filled 2018-11-03 (×2): qty 1

## 2018-11-03 MED ORDER — PROPRANOLOL HCL 10 MG PO TABS
10.0000 mg | ORAL_TABLET | Freq: Three times a day (TID) | ORAL | Status: DC | PRN
  Administered 2018-11-03: 10 mg via ORAL
  Filled 2018-11-03: qty 1

## 2018-11-03 NOTE — Progress Notes (Signed)
Recreation Therapy Notes  Date:  1.31.20 Time: 0930 Location: [address] Dayroom  Group Topic: Stress Management  Goal Area(s) Addresses:  Patient will identify positive stress management techniques. Patient will identify benefits of using stress management post d/c.  Intervention: Stress Management  Activity :  Progressive Muscle Relaxation.  LRT introduced the stress management technique of progressive muscle relaxation.  LRT read a script that guided patients through the process of tensing and relaxing each muscle group individually.  Patients were to follow along as script was read to engage in activity.  Education:  Stress Management, Discharge Planning.   Education Outcome: Acknowledges Education  Clinical Observations/Feedback:  Pt did not attend group.     Jillianne Gamino, LRT/CTRS        Ivery Michalski A 11/03/2018 11:19 AM 

## 2018-11-03 NOTE — Progress Notes (Signed)
[facility]  11/03/2018 12:09 PM Christine Beard  MRN:  [id number] Subjective: Patient is seen and examined.  Patient is a 28 year old female admitted on 10/28/2018 with suicidal ideation, depression and anxiety.  Objective: Patient is seen and examined.  Patient is a 28 year old female with a past psychiatric history significant for probable posttraumatic stress disorder as well as major depression.  She is seen in follow-up.  When we began discussing her discharge yesterday she became significantly more anxious.  She went to nursing shortly thereafter and said she was suicidal.  She was fine last night, but then again when we started discussing possible discharge she became suicidal again.  We had a long discussion about her suicidality.  She stated that she does not really want to be discharged, and feels a great deal of support from the other patients as well as the staff.  She went into detail how her significant other and father of her child is really not helpful in their home.  She lives in his home.  She also stated that his parents live across the street, and though they will come over and help with the care of the children they "overrule me every time".  She states she feels as though she is treated like a child.  She will tell her 816-year-old not to do something, and then either the significant other or his parents will allow it to occur.  We discussed the reality of the situation and that the medications would most likely be of no benefit with regard to their behaviors.  After further discussion she became more understanding about what psychiatric hospitalization could do and what it could not do.  Part of our discussion this morning in morning report dealt with the possibility of having child protective services or Department of Social Services going to the home to make sure that the disabled child is being cared for adequately.  She denied any suicidal ideation after our discussion.  She  denied any side effects to her current medications.  Her vital signs with regard to her blood pressure was stable, but she has been tachycardic.  She stated that the medication she had been taking at home was for too much thyroid and not thyroid medication.  I stopped that today as well as the Sudafed to attempt to lower her heart rate.  She slept 6.75 hours last night.  Principal Problem: Severe recurrent major depression without psychotic features (HCC) Diagnosis: Principal Problem:   Severe recurrent major depression without psychotic features (HCC)  Total Time spent with patient: 30 minutes  Past Psychiatric History: See admission H&P  Past Medical History:  Past Medical History:  Diagnosis Date  . Anxiety   . Depression   . Headache   . Hypothyroidism   . S/P tubal ligation 10/06/2017  . SVD (spontaneous vaginal delivery) 10/05/2017  . Thyroid disease     Past Surgical History:  Procedure Laterality Date  . TOE SURGERY    . TONSILLECTOMY    . TUBAL LIGATION N/A 10/06/2017   Procedure: POST PARTUM TUBAL LIGATION;  Surgeon: Sherian ReinBovard-Stuckert, Jody, MD;  Location: [facility];  Service: Gynecology;  Laterality: N/A;   Family History:  Family History  Problem Relation Age of Onset  . Diabetes Maternal Grandmother   . Diabetes Paternal Grandmother    Family Psychiatric  History: See admission H&P Social History:  Social History   Substance and Sexual Activity  Alcohol Use No     Social  History   Substance and Sexual Activity  Drug Use No    Social History   Socioeconomic History  . Marital status: Significant Other    Spouse name: Not on file  . Number of children: Not on file  . Years of education: Not on file  . Highest education level: Not on file  Occupational History  . Not on file  Social Needs  . Financial resource strain: Not on file  . Food insecurity:    Worry: Not on file    Inability: Not on file  . Transportation needs:    Medical: Not on file     Non-medical: Not on file  Tobacco Use  . Smoking status: Former Smoker    Last attempt to quit: 09/03/2014    Years since quitting: 4.1  . Smokeless tobacco: Never Used  Substance and Sexual Activity  . Alcohol use: No  . Drug use: No  . Sexual activity: Yes    Birth control/protection: None  Lifestyle  . Physical activity:    Days per week: Not on file    Minutes per session: Not on file  . Stress: Not on file  Relationships  . Social connections:    Talks on phone: Not on file    Gets together: Not on file    Attends religious service: Not on file    Active member of club or organization: Not on file    Attends meetings of clubs or organizations: Not on file    Relationship status: Not on file  Other Topics Concern  . Not on file  Social History Narrative  . Not on file   Additional Social History:    Pain Medications: Denies use Prescriptions: Denies use Over the Counter: Denies use History of alcohol / drug use?: No history of alcohol / drug abuse Longest period of sobriety (when/how long): NA                    Sleep: Good  Appetite:  Fair  Current Medications: Current Facility-Administered Medications  Medication Dose Route Frequency Provider Last Rate Last Dose  . acetaminophen (TYLENOL) tablet 650 mg  650 mg Oral Q6H PRN Nira Conn A, NP   650 mg at 11/03/18 1610  . alum & mag hydroxide-simeth (MAALOX/MYLANTA) 200-200-20 MG/5ML suspension 30 mL  30 mL Oral Q4H PRN Nira Conn A, NP      . feeding supplement (ENSURE ENLIVE) (ENSURE ENLIVE) liquid 237 mL  237 mL Oral BID BM Antonieta Pert, MD   237 mL at 11/03/18 0809  . FLUoxetine (PROZAC) capsule 40 mg  40 mg Oral Daily Antonieta Pert, MD   40 mg at 11/03/18 9604  . hydrOXYzine (ATARAX/VISTARIL) tablet 25 mg  25 mg Oral TID PRN Jackelyn Poling, NP   25 mg at 11/02/18 2159  . magnesium hydroxide (MILK OF MAGNESIA) suspension 30 mL  30 mL Oral Daily PRN Nira Conn A, NP      . meclizine  (ANTIVERT) tablet 25 mg  25 mg Oral BID Antonieta Pert, MD   25 mg at 11/03/18 0809  . mirtazapine (REMERON) tablet 15 mg  15 mg Oral QHS Antonieta Pert, MD   15 mg at 11/02/18 2159    Lab Results: No results found for this or any previous visit (from the past 48 hour(s)).  Blood Alcohol level:  No results found for: [facility]  Metabolic Disorder Labs: Lab Results  Component Value Date  HGBA1C 4.5 (L) 10/28/2018   MPG 82.45 10/28/2018   No results found for: PROLACTIN Lab Results  Component Value Date   CHOL 135 10/28/2018   TRIG 236 (H) 10/28/2018   HDL 42 10/28/2018   CHOLHDL 3.2 10/28/2018   VLDL 47 (H) 10/28/2018   LDLCALC 46 10/28/2018    Physical Findings: AIMS: Facial and Oral Movements Muscles of Facial Expression: None, normal Lips and Perioral Area: None, normal Jaw: None, normal Tongue: None, normal,Extremity Movements Upper (arms, wrists, hands, fingers): None, normal Lower (legs, knees, ankles, toes): None, normal, Trunk Movements Neck, shoulders, hips: None, normal, Overall Severity Severity of abnormal movements (highest score from questions above): None, normal Incapacitation due to abnormal movements: None, normal Patient's awareness of abnormal movements (rate only patient's report): No Awareness, Dental Status Current problems with teeth and/or dentures?: No Does patient usually wear dentures?: No  CIWA:  CIWA-Ar Total: 1 COWS:  COWS Total Score: 2  Musculoskeletal: Strength & Muscle Tone: within normal limits Gait & Station: normal Patient leans: N/A  Psychiatric Specialty Exam: Physical Exam  Nursing note and vitals reviewed. Constitutional: She is oriented to person, place, and time. She appears well-developed and well-nourished.  HENT:  Head: Normocephalic and atraumatic.  Respiratory: Effort normal.  Neurological: She is alert and oriented to person, place, and time.    ROS  Blood pressure 107/90, pulse (!) 129, temperature 98 F  (36.7 C), temperature source Oral, resp. rate 16, height 1' 10.05" (0.56 m), weight 45.4 kg, last menstrual period 10/04/2018, SpO2 100 %, unknown if currently breastfeeding.Body mass index is 144.65 kg/m.  General Appearance: Casual  Eye Contact:  Fair  Speech:  Normal Rate  Volume:  Normal  Mood:  Anxious  Affect:  Congruent  Thought Process:  Coherent and Descriptions of Associations: Intact  Orientation:  Full (Time, Place, and Person)  Thought Content:  Logical  Suicidal Thoughts:  No  Homicidal Thoughts:  No  Memory:  Immediate;   Fair Recent;   Fair Remote;   Fair  Judgement:  Intact  Insight:  Lacking  Psychomotor Activity:  Increased  Concentration:  Concentration: Fair and Attention Span: Fair  Recall:  [employer] of Knowledge:  Fair  Language:  Fair  Akathisia:  Negative  Handed:  Left  AIMS (if indicated):     Assets:  Communication Skills Desire for Improvement Housing Physical Health Resilience  ADL's:  Intact  Cognition:  WNL  Sleep:  Number of Hours: 6.75     Treatment Plan Summary: Daily contact with patient to assess and evaluate symptoms and progress in treatment, Medication management and Plan : Patient is seen and examined.  Patient is a 28 year old female with the above-stated past psychiatric history who is seen in follow-up.  We had a long discussion today about discharge planning.  She really does not like going back to the home that she has to live in.  She has no alternatives.  She stated that the family as well as her significant other is not helpful with regard to the children in it she feels overwhelmed at times.  We discussed contacting social services as an outpatient and seeing if there are other services that are available to help her deal with her child's illness.  Also discussed what other possibilities for living circumstances she had besides staying in the house with the boyfriend.  She is rather limited for options.  We have agreed on  discharge tomorrow, and hopefully we will be able to  carry anything out today to get follow-up appointments.  It may be helpful to have child protective services or the Department of Social Services going evaluate the safety of the home.  Given her tachycardia I am going to stop her thyroid medication today, and as well stop the Sudafed and see if her heart rate declines.  I will give her a little propranolol this morning to see if we can slow down to decrease her anxiety.  No change in her psychiatric medications. 1.  Stop Synthroid. 2.  Stop Sudafed. 3.  Continue fluoxetine 40 mg p.o. daily for anxiety and mood symptoms. 4.  Continue meclizine 25 mg p.o. twice daily for dizziness, but changed to PRN. 5.  Continue mirtazapine 15 mg p.o. nightly for sleep, appetite stimulation as well as mood and anxiety control. 6.  Continue Cortisporin attic suspension drops for left ear pain. 7.  She has completed the azithromycin course. 8.  Propranolol 10 mg p.o. 3 times daily heart rate greater than 100. 9.  Disposition planning-hopefully be able to discharge in a.m.  Antonieta PertGreg Lawson Shelli Portilla, MD 11/03/2018, 12:09 PM

## 2018-11-03 NOTE — Progress Notes (Signed)
Counselor Ethel Rana, MS, [facility], [facility] checked in with pt. Pt reported feeling anxious about discharge and not feeling ready to leave. Pt reported feeling unsupported at home; she said she does all the parenting, cooking, and cleaning and feels like her partner does not understand how overwhelmed she is. Pt stated she is trying to take care of herself so that she can take care of her daughters and that her partner and his mother are pressuring her to return home and be a mother; pt reports not feeling ready to return home. Pt stated that she does not want to discharge and end up back in the hospital a few days later due to premature discharge.   Pt expressed frustration when her partner tells her daughter she is "on vacation" and when others tell her how to parent her daughters. Pt attributed her anxiety and depression to external factors, including a lack of support from her partner, poor parenting by her parents, and shame induced by her partner's mother.   Counselor practiced progressive muscle relaxation grounding skill with pt. Pt reported she will try this grounding skill next time she feels triggered while talking with her partner and his mother. Pt reported she used to enjoy writing poetry and drawing but that she has not done either since the birth of her daughter three years ago.  Pt went to group following check-in with counselor.

## 2018-11-03 NOTE — Progress Notes (Signed)
Adult Psychoeducational Group Note  Date:  11/03/2018 Time:  7:16 PM  Group Topic/Focus:  Conflict Resolution:   The focus of this group is to discuss the conflict resolution process and how it may be used upon discharge.  Participation Level:  Active  Participation Quality:  Appropriate  Affect:  Appropriate  Cognitive:  Appropriate  Insight: Appropriate  Engagement in Group:  Engaged  Modes of Intervention:  Discussion  Additional Comments:  Pt attended group and participated in group discussion.  Christine Beard Dorean Hiebert 11/03/2018, 7:16 PM

## 2018-11-03 NOTE — Plan of Care (Addendum)
  Problem: Education: Goal: Emotional status will improve Outcome: Not Progressing Goal: Mental status will improve Outcome: Not Progressing   Problem: Coping: Goal: Ability to identify and develop effective coping behavior will improve Outcome: Not Progressing   Problem: Self-Concept: Goal: Ability to identify factors that promote anxiety will improve Outcome: Not Progressing  D: Patient is having self harm thoughts again this morning.  It appears to coincide with her discharge.  She states, "I feel like I'm not ready.  Discussed patient's treatment this morning with team; she may be a possible discharge tomorrow.  She rates her ear pain as a 9; tylenol was given.  She rates her depression and hopelessness as an 8; anxiety as a 9.  She is sleeping well; she rates her appetite as poor.  Her energy level is low, and her concentration is poor.  She is able to contract for safety.  Patient presents with flat, blunted affect; her mood is sullen and depressed.  A: Continue to monitor medication management and MD orders.  Safety checks completed every 15 minutes per protocol.  Offer support and encouragement as needed.  R: Patient is receptive to staff; his/her behavior is appropriate.

## 2018-11-03 NOTE — Progress Notes (Signed)
Patient ID: [name], female   DOB: Jul 05, 1991, 28 y.o.   MRN: [id number] Pt in dayroom interacting with peers. Pt reported having headache rating it as 10 on a 0-10 scale. Pt c/o chest pain and "skin burning". Vital sign in normal range. NP notified of pt's condition. Prn propranolol given for elevated HR. Pt went to her room to rest and woke up feeling better and HR decreased. Pt stayed in dayroom and watched TV. Before going back to bed pt c/o chest and bil arm burning. Pt stated it feels like her blood is boiling. NP notified, no new orders given. Pt reports this episode is been happening  to her for about a year. Pt reports she has not been able to seek help because of lack of insurance. Medications administered as prescribed.  Will continue to monitor  for safety and stability.

## 2018-11-03 NOTE — Tx Team (Signed)
Interdisciplinary Treatment and Diagnostic Plan Update  11/03/2018 Time of Session: 10:00am Christine Beard MRN: [id number]  Principal Diagnosis: Severe recurrent major depression without psychotic features [facility])  Secondary Diagnoses: Principal Problem:   Severe recurrent major depression without psychotic features (HCC)   Current Medications:  Current Facility-Administered Medications  Medication Dose Route Frequency Provider Last Rate Last Dose  . acetaminophen (TYLENOL) tablet 650 mg  650 mg Oral Q6H PRN Nira Conn A, NP   650 mg at 11/03/18 4098  . alum & mag hydroxide-simeth (MAALOX/MYLANTA) 200-200-20 MG/5ML suspension 30 mL  30 mL Oral Q4H PRN Nira Conn A, NP      . feeding supplement (ENSURE ENLIVE) (ENSURE ENLIVE) liquid 237 mL  237 mL Oral BID BM Antonieta Pert, MD   237 mL at 11/03/18 0809  . FLUoxetine (PROZAC) capsule 40 mg  40 mg Oral Daily Antonieta Pert, MD   40 mg at 11/03/18 1191  . hydrOXYzine (ATARAX/VISTARIL) tablet 25 mg  25 mg Oral TID PRN Jackelyn Poling, NP   25 mg at 11/02/18 2159  . magnesium hydroxide (MILK OF MAGNESIA) suspension 30 mL  30 mL Oral Daily PRN Nira Conn A, NP      . meclizine (ANTIVERT) tablet 25 mg  25 mg Oral BID Antonieta Pert, MD   25 mg at 11/03/18 0809  . mirtazapine (REMERON) tablet 15 mg  15 mg Oral QHS Antonieta Pert, MD   15 mg at 11/02/18 2159   PTA Medications: Medications Prior to Admission  Medication Sig Dispense Refill Last Dose  . acetaminophen (TYLENOL) 500 MG tablet Take 1,000 mg by mouth every 6 (six) hours as needed for mild pain or headache.    10/29/2017  . ibuprofen (ADVIL,MOTRIN) 600 MG tablet Take 1 tablet (600 mg total) by mouth every 6 (six) hours. 30 tablet 0 10/29/2017 at Unknown time  . ibuprofen (IBU) 600 MG tablet IBU 600 mg tablet  take 1 tablet by mouth every 6 hours     . levothyroxine (SYNTHROID, LEVOTHROID) 50 MCG tablet levothyroxine 50 mcg tablet  take 1 tablet by mouth once daily    Past Month at Unknown time    Patient Stressors: Financial difficulties Health problems Traumatic event  Patient Strengths: Ability for insight Active sense of humor Average or above average intelligence Capable of independent living  Treatment Modalities: Medication Management, Group therapy, Case management,  1 to 1 session with clinician, Psychoeducation, Recreational therapy.   Physician Treatment Plan for Primary Diagnosis: Severe recurrent major depression without psychotic features (HCC) Long Term Goal(s): Improvement in symptoms so as ready for discharge Improvement in symptoms so as ready for discharge   Short Term Goals: Ability to verbalize feelings will improve Ability to disclose and discuss suicidal ideas Ability to demonstrate self-control will improve Compliance with prescribed medications will improve Ability to identify triggers associated with substance abuse/mental health issues will improve Ability to disclose and discuss suicidal ideas Ability to demonstrate self-control will improve Ability to identify and develop effective coping behaviors will improve Ability to maintain clinical measurements within normal limits will improve Compliance with prescribed medications will improve  Medication Management: Evaluate patient's response, side effects, and tolerance of medication regimen.  Therapeutic Interventions: 1 to 1 sessions, Unit Group sessions and Medication administration.  Evaluation of Outcomes: Progressing  Physician Treatment Plan for Secondary Diagnosis: Principal Problem:   Severe recurrent major depression without psychotic features (HCC)  Long Term Goal(s): Improvement in symptoms so as ready  for discharge Improvement in symptoms so as ready for discharge   Short Term Goals: Ability to verbalize feelings will improve Ability to disclose and discuss suicidal ideas Ability to demonstrate self-control will improve Compliance with  prescribed medications will improve Ability to identify triggers associated with substance abuse/mental health issues will improve Ability to disclose and discuss suicidal ideas Ability to demonstrate self-control will improve Ability to identify and develop effective coping behaviors will improve Ability to maintain clinical measurements within normal limits will improve Compliance with prescribed medications will improve     Medication Management: Evaluate patient's response, side effects, and tolerance of medication regimen.  Therapeutic Interventions: 1 to 1 sessions, Unit Group sessions and Medication administration.  Evaluation of Outcomes: Progressing   RN Treatment Plan for Primary Diagnosis: Severe recurrent major depression without psychotic features (HCC) Long Term Goal(s): Knowledge of disease and therapeutic regimen to maintain health will improve  Short Term Goals: Ability to verbalize frustration and anger appropriately will improve, Ability to disclose and discuss suicidal ideas, Ability to identify and develop effective coping behaviors will improve and Compliance with prescribed medications will improve  Medication Management: RN will administer medications as ordered by provider, will assess and evaluate patient's response and provide education to patient for prescribed medication. RN will report any adverse and/or side effects to prescribing provider.  Therapeutic Interventions: 1 on 1 counseling sessions, Psychoeducation, Medication administration, Evaluate responses to treatment, Monitor vital signs and CBGs as ordered, Perform/monitor CIWA, COWS, AIMS and Fall Risk screenings as ordered, Perform wound care treatments as ordered.  Evaluation of Outcomes: Progressing   LCSW Treatment Plan for Primary Diagnosis: Severe recurrent major depression without psychotic features (HCC) Long Term Goal(s): Safe transition to appropriate next level of care at discharge, Engage  patient in therapeutic group addressing interpersonal concerns.  Short Term Goals: Engage patient in aftercare planning with referrals and resources, Increase social support, Increase emotional regulation, Identify triggers associated with mental health/substance abuse issues and Increase skills for wellness and recovery  Therapeutic Interventions: Assess for all discharge needs, 1 to 1 time with Social worker, Explore available resources and support systems, Assess for adequacy in community support network, Educate family and significant other(s) on suicide prevention, Complete Psychosocial Assessment, Interpersonal group therapy.  Evaluation of Outcomes: Progressing  Progress in Treatment: Attending groups: Yes. Participating in groups: Yes. Taking medication as prescribed: Yes. Toleration medication: Yes. Family/Significant other contact made: No, will contact:  fiance Patient understands diagnosis: Yes. Discussing patient identified problems/goals with staff: Yes. Medical problems stabilized or resolved: Yes. Denies suicidal/homicidal ideation: Yes. Issues/concerns per patient self-inventory: Yes.  New problem(s) identified: No, Describe:  No insurance or income  New Short Term/Long Term Goal(s): medication management for mood stabilization; elimination of SI thoughts; development of comprehensive mental wellness/sobriety plan.  Patient Goals: "Get feeling better."  Discharge Plan or Barriers: CSW continuing to assess. MHAG pamphlet, Mobile Crisis informationprovided to patient for additional community support and resources.   Reason for Continuation of Hospitalization: Anxiety Depression Suicidal ideation  Estimated Length of Stay: 11/06/2018  Attendees: Patient: Christine Beard 11/03/2018 10:53 AM  Physician: Dr. Landry MellowGreg Clary, MD 11/03/2018 10:53 AM  Nursing: Rayfield Citizenaroline.Leonard SchwartzB, RN  11/03/2018 10:53 AM  RN Care Manager: Onnie BoerJennifer Clark, RN 11/03/2018 10:53 AM  Social Worker: Enid Cutterharlotte  Hoy, LCSWA; [place] Provencio, [place] 11/03/2018 10:53 AM  Recreational Therapist:  11/03/2018 10:53 AM  Other: Marciano SequinJanet Sykes, NP  11/03/2018 10:53 AM  Other:  11/03/2018 10:53 AM  Other: 11/03/2018 10:53 AM  Scribe for Treatment Team: Mariah MillingJolan E Tanishka Drolet, LCSWA 11/03/2018 10:53 AM

## 2018-11-04 MED ORDER — HYDROXYZINE HCL 25 MG PO TABS: 25.0000 mg | ORAL_TABLET | Freq: Three times a day (TID) | ORAL | 0 refills | Status: DC | PRN

## 2018-11-04 MED ORDER — MIRTAZAPINE 15 MG PO TABS: 15.0000 mg | ORAL_TABLET | Freq: Every day | ORAL | 0 refills | Status: DC

## 2018-11-04 MED ORDER — FLUOXETINE HCL 40 MG PO CAPS: 40.0000 mg | ORAL_CAPSULE | Freq: Every day | ORAL | 0 refills | Status: DC

## 2018-11-04 NOTE — Progress Notes (Signed)
Pt discharged to lobby - husband to pick pt up. Pt was stable and appreciative at that time. All papers and prescriptions, samples were given and valuables returned. Verbal understanding expressed. Denies SI/HI and A/VH. Pt given opportunity to express concerns and ask questions.

## 2018-11-04 NOTE — Discharge Summary (Signed)
Physician Discharge Summary Note  Patient:  Christine Beard is an 28 y.o., female  MRN:  [id number]  DOB:  1991-05-03  Patient phone:  [phone] (home)   Patient address:   [address] [place] [id number],   Total Time spent with patient: Greater than 30 minutes  Date of Admission:  10/27/2018   Date of Discharge: 11-04-2018  Reason for Admission: Worsening depression over the last several weeks.  Principal Problem: Severe recurrent major depression without psychotic features [facility](HCC)  Discharge Diagnoses: Principal Problem:   Severe recurrent major depression without psychotic features [facility](HCC)  Past Psychiatric History: Major depression.  Past Medical History:  Past Medical History:  Diagnosis Date  . Anxiety   . Depression   . Headache   . Hypothyroidism   . S/P tubal ligation 10/06/2017  . SVD (spontaneous vaginal delivery) 10/05/2017  . Thyroid disease     Past Surgical History:  Procedure Laterality Date  . TOE SURGERY    . TONSILLECTOMY    . TUBAL LIGATION N/A 10/06/2017   Procedure: POST PARTUM TUBAL LIGATION;  Surgeon: Christine ReinBovard-Stuckert, Jody, MD;  Location: [facility];  Service: Gynecology;  Laterality: N/A;   Family History:  Family History  Problem Relation Age of Onset  . Diabetes Maternal Grandmother   . Diabetes Paternal Grandmother    Family Psychiatric  History: See H&P  Social History:  Social History   Substance and Sexual Activity  Alcohol Use No     Social History   Substance and Sexual Activity  Drug Use No    Social History   Socioeconomic History  . Marital status: Significant Other    Spouse name: Not on file  . Number of children: Not on file  . Years of education: Not on file  . Highest education level: Not on file  Occupational History  . Not on file  Social Needs  . Financial resource strain: Not on file  . Food insecurity:    Worry: Not on file    Inability: Not on file  . Transportation needs:     Medical: Not on file    Non-medical: Not on file  Tobacco Use  . Smoking status: Former Smoker    Last attempt to quit: 09/03/2014    Years since quitting: 4.1  . Smokeless tobacco: Never Used  Substance and Sexual Activity  . Alcohol use: No  . Drug use: No  . Sexual activity: Yes    Birth control/protection: None  Lifestyle  . Physical activity:    Days per week: Not on file    Minutes per session: Not on file  . Stress: Not on file  Relationships  . Social connections:    Talks on phone: Not on file    Gets together: Not on file    Attends religious service: Not on file    Active member of club or organization: Not on file    Attends meetings of clubs or organizations: Not on file    Relationship status: Not on file  Other Topics Concern  . Not on file  Social History Narrative  . Not on file   Hospital Course: (Per Md's admission SRA): 27, lives with fiance, has two children (ages 30,1) who are currently with their father, homemaker. Presented to hospital voluntarily due to worsening depression over the last several weeks. Describes anhedonia, hypersomnia, poor appetite, low energy, suicidal ideations which she describes as passive, without any specific plan or intention.  Denies psychotic symptoms. She  is facing significant stressors: financial stressors, limited family support, one year old daughter has medical issues, aspirates at times, and was recently diagnosed with L eye blindness. Psychiatric History-  One prior suicide attempt at age 28 for depression, suicidal ideations. Denies history of suicide attempts, denies history of self cutting . She remembers Zoloft trial in the past, but does not feel it helped. She also remembers Fluoxetine in the past, and does think it helped partially. Medical History - prescribed Levothyroxine, but states she was told that she did not have Hypothyroidism, so has not taken said medication for several months. Was not taking any medications  before admission. Allergic to Codeine. Stopped smoking 1 year ago. States her BP has been high at times in the past, but no formal history of HTN. Denies drug or alcohol abuse .  (Admission UDS positive for Amphetamines and Cannabis-  patient categorically denies any drug use) .   After the above admission evaluation, Christine Beard was recommended for mood stabilization treatment. The medication regimen for her presenting symptoms were discussed with her & with her consent initiated. She was treated, stabilized & discharged on the medications as listed below. She was also enrolled & participated in the group counseling sessions being offered & held on this unit. She learned coping skills. She was resumed/discharged on all her pertinent home medications for her other pre-existing medical issues. She tolerated her treatment regimen without any adverse effects or reactions reported.  Amaria's symptoms responded well to her treatment regimen. She is currently mentally & medically stable to be discharged to continue mental health care on an outpatient basis as noted below. She is provided with all the necessary information needed to make this appointment without problems.  Upon discharge, Christine Beard adamantly denies any SIHI, AVH, delusional thoughts or paranoia. She is provided with a 7 days worth supply [facility]. She was able to engage in safety planning including [facility] or contact emergency services if she feels unable to maintain her own safety or the safety of others. Pt had no further questions, comments or concerns.  [facility] in no apparent distress.   Physical Findings: AIMS: Facial and Oral Movements Muscles of Facial Expression: None, normal Lips and Perioral Area: None, normal Jaw: None, normal Tongue: None, normal,Extremity Movements Upper (arms, wrists, hands, fingers): None, normal Lower (legs,  knees, ankles, toes): None, normal, Trunk Movements Neck, shoulders, hips: None, normal, Overall Severity Severity of abnormal movements (highest score from questions above): None, normal Incapacitation due to abnormal movements: None, normal Patient's awareness of abnormal movements (rate only patient's report): No Awareness, Dental Status Current problems with teeth and/or dentures?: No Does patient usually wear dentures?: No  CIWA:  CIWA-Ar Total: 1 COWS:  COWS Total Score: 2  Musculoskeletal: Strength & Muscle Tone: within normal limits Gait & Station: normal Patient leans: N/A  Psychiatric Specialty Exam: Physical Exam  Nursing note and vitals reviewed. Constitutional: She appears well-developed.  HENT:  Head: Normocephalic.  Eyes: Pupils are equal, round, and reactive to light.  Neck: Normal range of motion.  Respiratory: Effort normal.  GI: Soft.  Genitourinary:    Genitourinary Comments: Deferred   Musculoskeletal: Normal range of motion.  Neurological: She is alert.  Skin: Skin is warm.    Review of Systems  Constitutional: Negative.   HENT: Negative.   Eyes: Negative.   Respiratory: Negative.  Negative for cough and shortness of  breath.   Cardiovascular: Negative.  Negative for chest pain and palpitations.  Gastrointestinal: Negative.  Negative for abdominal pain, heartburn, nausea and vomiting.  Genitourinary: Negative.   Musculoskeletal: Negative.   Skin: Negative.   Neurological: Negative.  Negative for dizziness and headaches.  Endo/Heme/Allergies: Negative.   Psychiatric/Behavioral: Positive for depression (stable) and substance abuse (Hx. Amphetamine & THC use disorders). Negative for hallucinations, memory loss and suicidal ideas. The patient has insomnia (Stable). The patient is not nervous/anxious (Stable).     Blood pressure (!) 126/95, pulse 82, temperature 98 F (36.7 C), temperature source Oral, resp. rate 16, height 1' 10.05" (0.56 m), weight 45.4  kg, SpO2 100 %, unknown if currently breastfeeding.Body mass index is 144.65 kg/m.  See Md's discharge SRA     Has this patient used any form of tobacco in the last 30 days? (Cigarettes, Smokeless Tobacco, Cigars, and/or Pipes): N/A  Blood Alcohol level:  No results found for: [facility]  Metabolic Disorder Labs:  Lab Results  Component Value Date   HGBA1C 4.5 (L) 10/28/2018   MPG 82.45 10/28/2018   No results found for: PROLACTIN Lab Results  Component Value Date   CHOL 135 10/28/2018   TRIG 236 (H) 10/28/2018   HDL 42 10/28/2018   CHOLHDL 3.2 10/28/2018   VLDL 47 (H) 10/28/2018   LDLCALC 46 10/28/2018   See Psychiatric Specialty Exam and Suicide Risk Assessment completed by Attending Physician prior to discharge.  Discharge destination:  Home  Is patient on multiple antipsychotic therapies at discharge:  No   Has Patient had three or more failed trials of antipsychotic monotherapy by history:  No  Recommended Plan for Multiple Antipsychotic Therapies: NA  Discharge Instructions    Diet - low sodium heart healthy   Complete by:  As directed    Discharge instructions   Complete by:  As directed    Take all medications as prescribed. Keep all follow-up appointments as scheduled.  Do not consume alcohol or use illegal drugs while on prescription medications. Report any adverse effects from your medications to your primary care provider promptly.  In the event of recurrent symptoms or worsening symptoms, call 911, a crisis hotline, or go to the nearest emergency department for evaluation.   Increase activity slowly   Complete by:  As directed      Allergies as of 11/04/2018      Reactions   Cinnamon Itching   Codeine Nausea And Vomiting      Medication List    STOP taking these medications   acetaminophen 500 MG tablet Commonly known as:  TYLENOL   IBU 600 MG tablet Generic drug:  ibuprofen   ibuprofen 600 MG tablet Commonly known as:  ADVIL,MOTRIN     TAKE  these medications     Indication  FLUoxetine 40 MG capsule Commonly known as:  PROZAC Take 1 capsule (40 mg total) by mouth daily for 30 days.  Indication:  Depression   hydrOXYzine 25 MG tablet Commonly known as:  ATARAX/VISTARIL Take 1 tablet (25 mg total) by mouth 3 (three) times daily as needed for anxiety.  Indication:  Feeling Anxious, Pain   levothyroxine 50 MCG tablet Commonly known as:  SYNTHROID, LEVOTHROID levothyroxine 50 mcg tablet  take 1 tablet by mouth once daily  Indication:  Underactive Thyroid   mirtazapine 15 MG tablet Commonly known as:  REMERON Take 1 tablet (15 mg total) by mouth at bedtime.  Indication:  Major Depressive Disorder  Follow-up Information    [facility] Follow up on 11/09/2018.   Specialty:  Behavioral Health Why:  Hospital follow up appointment is 2/6 at 8:00a. Please bring your photo ID, proof of insurance, SSN, current medications, and discharge paperwork from this hospitalization.  [address] [place] [address] [phone]          Follow-up recommendations: Activity:  As tolerated Diet: As recommended by your primary care doctor. Keep all scheduled follow-up appointments as recommended.  Comments: Patient is instructed prior to discharge to: Take all medications as prescribed by his/her mental healthcare provider. Report any adverse effects and or reactions from the medicines to his/her outpatient provider promptly. Patient has been instructed & cautioned: To not engage in alcohol and or illegal drug use while on prescription medicines. In the event of worsening symptoms, patient is instructed to call the crisis hotline, 911 and or go to the nearest ED for appropriate evaluation and treatment of symptoms. To follow-up with his/her primary care provider for your other medical issues, concerns and or health care needs.   Signed: Armandina Stammer, NP, PMHNP, FNP-BC 11/04/2018, 10:15 AM

## 2018-11-04 NOTE — [facility] Group Notes (Signed)
LCSW Group Therapy Note  11/04/2018   10:00-11:00am   Type of Therapy and Topic:  Group Therapy: Anger Cues and Responses  Participation Level:  None   Description of Group:   In this group, patients learned how to recognize the physical, cognitive, emotional, and behavioral responses they have to anger-provoking situations.  They identified a recent time they became angry and how they reacted.  They analyzed how their reaction was possibly beneficial and how it was possibly unhelpful.  The group discussed a variety of healthier coping skills that could help with such a situation in the future.  Deep breathing was practiced briefly.  Therapeutic Goals: 1. Patients will remember their last incident of anger and how they felt emotionally and physically, what their thoughts were at the time, and how they behaved. 2. Patients will identify how their behavior at that time worked for them, as well as how it worked against them. 3. Patients will explore possible new behaviors to use in future anger situations. 4. Patients will learn that anger itself is normal and cannot be eliminated, and that healthier reactions can assist with resolving conflict rather than worsening situations.  Summary of Patient Progress:  The patient was present at the beginning of group, but left before it was her turn to share and did not return.  Therapeutic Modalities:   Cognitive Behavioral Therapy  Lynnell Chad

## 2018-11-04 NOTE — [facility] Suicide Risk Assessment (Signed)
[facility] Discharge Suicide Risk Assessment   Principal Problem: Severe recurrent major depression without psychotic features [facility]) Discharge Diagnoses: Principal Problem:   Severe recurrent major depression without psychotic features (HCC)   Total Time spent with patient: 15 minutes  Musculoskeletal: Strength & Muscle Tone: within normal limits Gait & Station: normal Patient leans: N/A  Psychiatric Specialty Exam: Review of Systems  All other systems reviewed and are negative.   Blood pressure (!) 126/95, pulse 82, temperature 98 F (36.7 C), temperature source Oral, resp. rate 16, height 1' 10.05" (0.56 m), weight 45.4 kg, SpO2 100 %, unknown if currently breastfeeding.Body mass index is 144.65 kg/m.  General Appearance: Casual  Eye Contact::  Fair  Speech:  Normal Rate409  Volume:  Normal  Mood:  Anxious  Affect:  Congruent  Thought Process:  Coherent and Descriptions of Associations: Intact  Orientation:  Full (Time, Place, and Person)  Thought Content:  Logical  Suicidal Thoughts:  No  Homicidal Thoughts:  No  Memory:  Immediate;   Fair Recent;   Fair Remote;   Fair  Judgement:  Intact  Insight:  Fair  Psychomotor Activity:  Normal  Concentration:  Fair  Recall:  [employer] of Knowledge:Fair  Language: Good  Akathisia:  Negative  Handed:  Right  AIMS (if indicated):     Assets:  Desire for Improvement Housing Resilience  Sleep:  Number of Hours: 6.75  Cognition: WNL  ADL's:  Intact   Mental Status Per Nursing Assessment::   On Admission:  NA  Demographic Factors:  Caucasian, Low socioeconomic status and Unemployed  Loss Factors: Financial problems/change in socioeconomic status  Historical Factors: Impulsivity  Risk Reduction Factors:   Responsible for children under 53 years of age and Living with another person, especially a relative  Continued Clinical Symptoms:  Severe Anxiety and/or Agitation Depression:   Impulsivity Personality Disorders:    Cluster B Previous Psychiatric Diagnoses and Treatments  Cognitive Features That Contribute To Risk:  None    Suicide Risk:  Minimal: No identifiable suicidal ideation.  Patients presenting with no risk factors but with morbid ruminations; may be classified as minimal risk based on the severity of the depressive symptoms  Follow-up Information    [facility] Follow up on 11/09/2018.   Specialty:  Behavioral Health Why:  Hospital follow up appointment is 2/6 at 8:00a. Please bring your photo ID, proof of insurance, SSN, current medications, and discharge paperwork from this hospitalization.  Contact information: [address] [place] [address] [phone]           Plan Of Care/Follow-up recommendations:  Activity:  ad lib  Antonieta Pert, MD 11/04/2018, 8:05 AM

## 2018-11-04 NOTE — Progress Notes (Signed)
D. Pt presents with an anxious affect - friendly during interactions.   Pt has been visible in the milieu interacting appropriately with peers and staff.  Pt currently denies SI/HI and AVH A. Labs and vitals monitored. Pt given and educated on medications. Pt supported emotionally and encouraged to express concerns and ask questions.   R. Pt remains safe with 15 minute checks. Will continue POC.

## 2018-11-04 NOTE — Progress Notes (Signed)
  [facility] Adult Case Management Discharge Plan :  Will you be returning to the same living situation after discharge:  Yes,  with significant other and children At discharge, do you have transportation home?: Yes,  family members Do you have the ability to pay for your medications: No.  Limited income, no insurance, will need help from local agency  Release of information consent forms completed and in the chart;  Patient's signature needed at discharge.  Patient to Follow up at: Follow-up Information    [facility] Follow up on 11/09/2018.   Specialty:  Behavioral Health Why:  Hospital follow up appointment is 2/6 at 8:00a. Please bring your photo ID, proof of insurance, SSN, current medications, and discharge paperwork from this hospitalization.  Contact information: [address] [place] [address] [phone]           Next level of care provider has access to [facility] Link:no  Safety Planning and Suicide Prevention discussed: Yes,  with fiancee     Has patient been referred to the Quitline?: Patient refused referral  Patient has been referred for addiction treatment: N/A  Lynnell Chad, LCSW 11/04/2018, 8:48 AM

## 2019-03-18 IMAGING — CT CT HEAD W/O CM
3 series · 16 of 45 positions shown, 19 images · non-contrast
Comparison: None.

CLINICAL DATA: Headache

EXAM:
CT HEAD WITHOUT CONTRAST
TECHNIQUE: Contiguous axial images were obtained from the base of the skull
through the vertex without intravenous contrast.

[Series 2: head wo · axial · 0.47mm/px · z∈[-112,+3]mm · 10 of 28 slices shown, 13 images]
[im 3/28  brain]
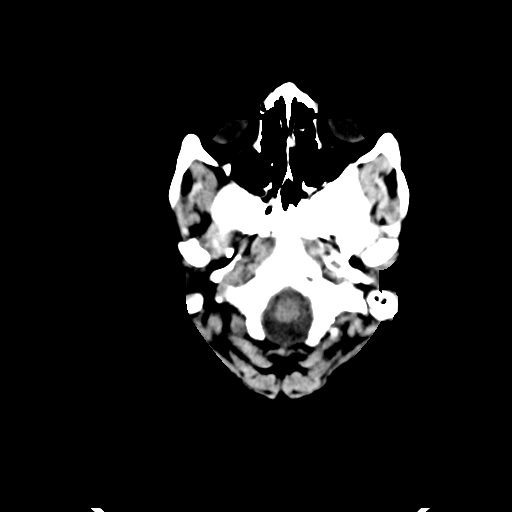
[im 3/28  bone]
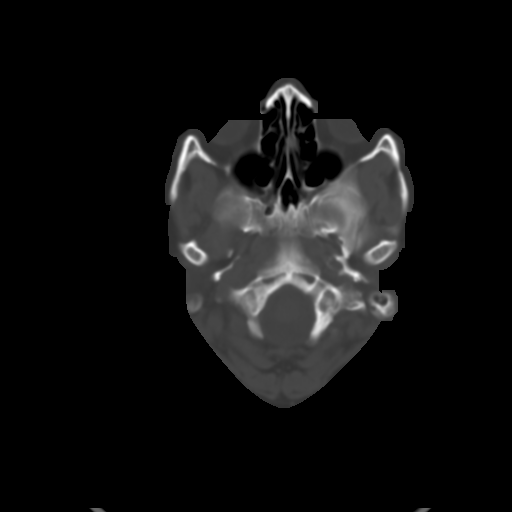
[im 5/28  brain]
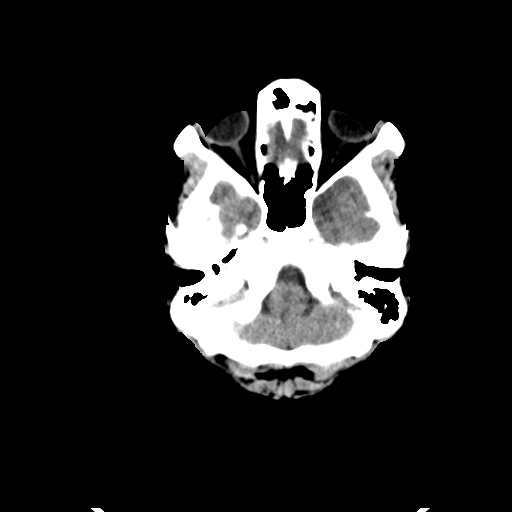
[im 8/28  brain]
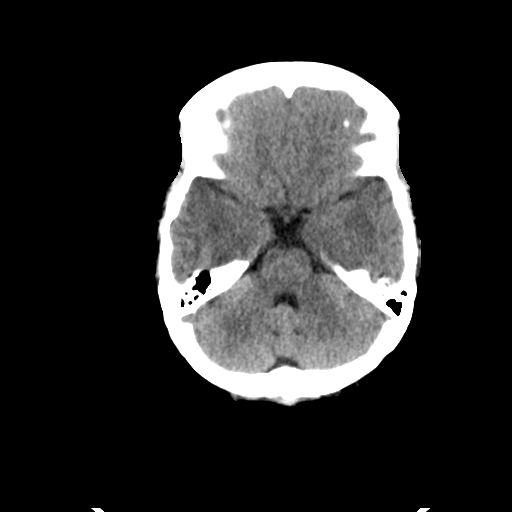
[im 11/28  brain]
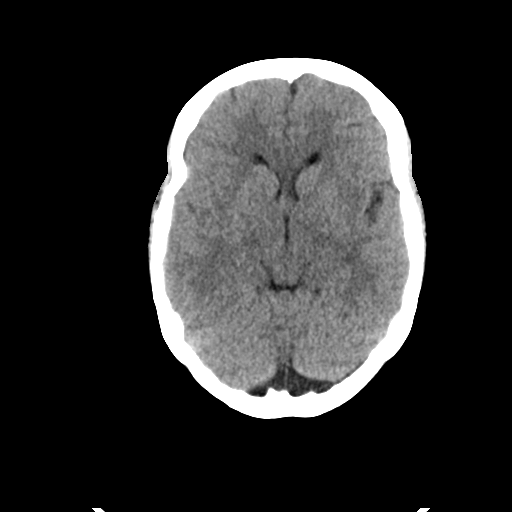
[im 13/28  brain]
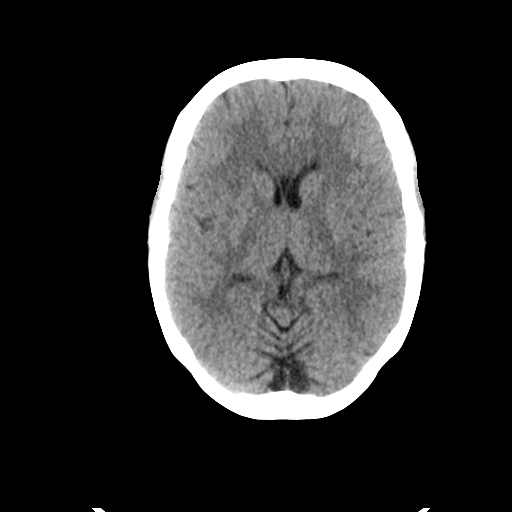
[im 13/28  bone]
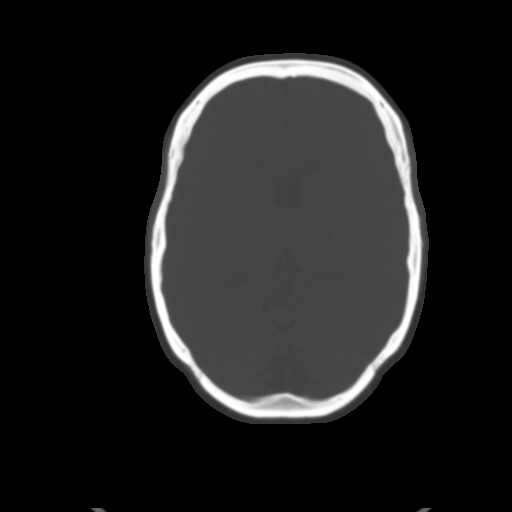
[im 16/28  brain]
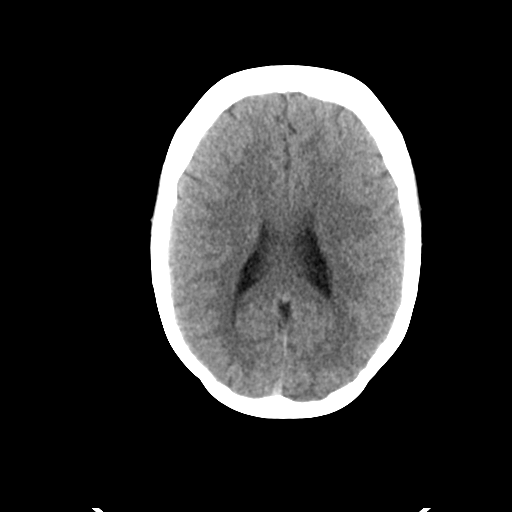
[im 18/28  brain]
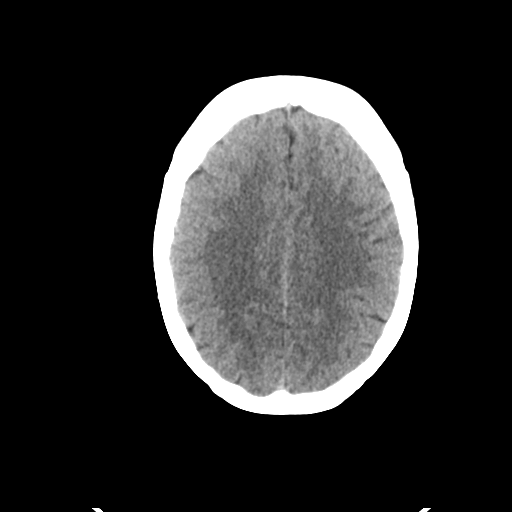
[im 21/28  brain]
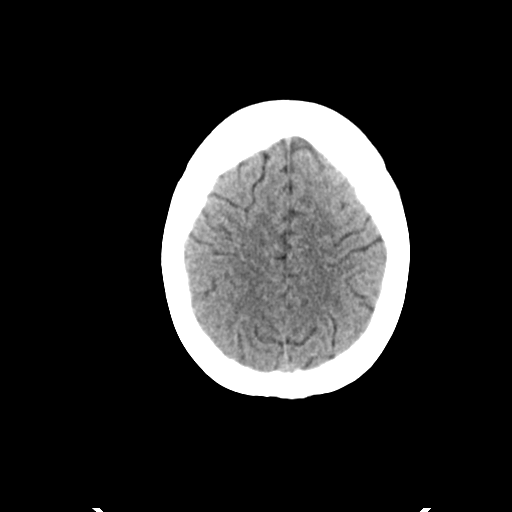
[im 24/28  brain]
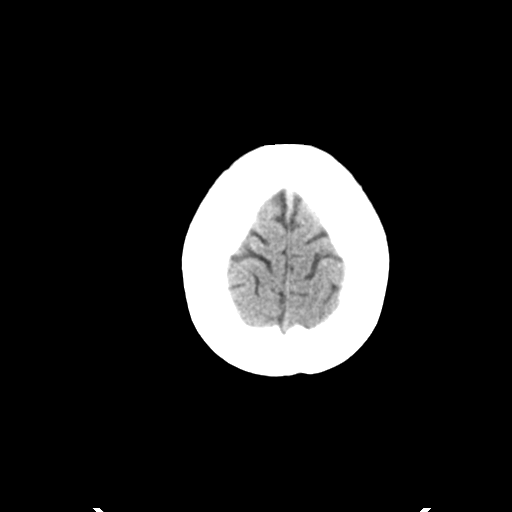
[im 24/28  bone]
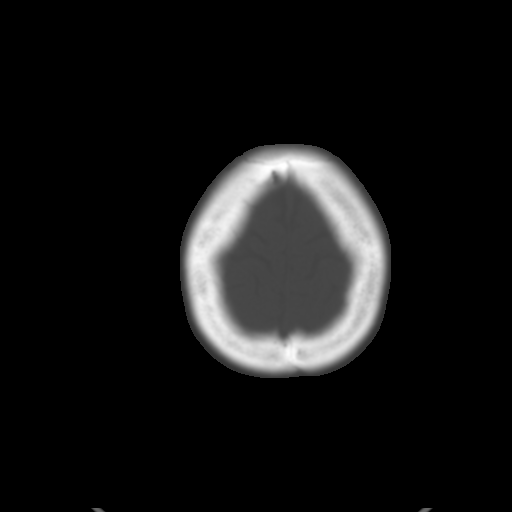
[im 26/28  brain]
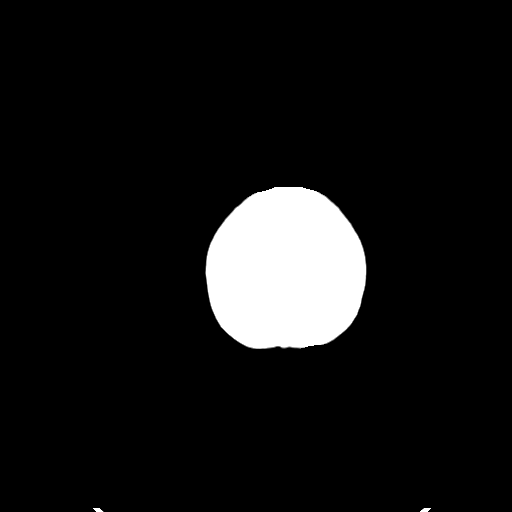

[Series 4: coronal soft tissue · coronal · 0.34mm/px · 3 of 59 slices shown]
[im 20/59  brain]
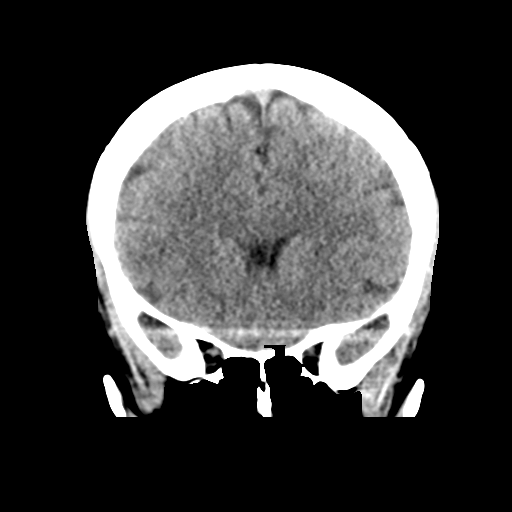
[im 26/59  brain]
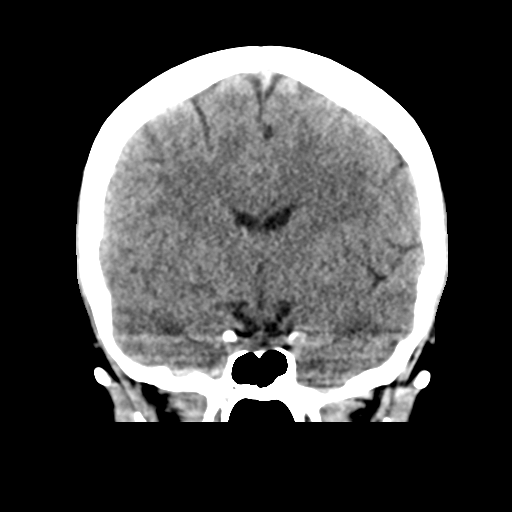
[im 33/59  brain]
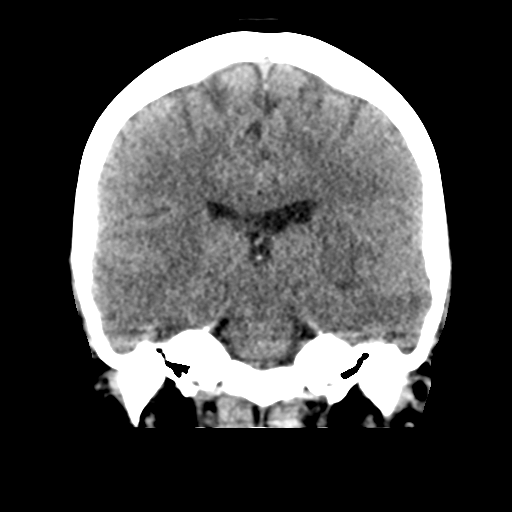

[Series 5: sagittal soft tissue · sagittal · 0.32mm/px · 3 of 46 slices shown]
[im 16/46  brain]
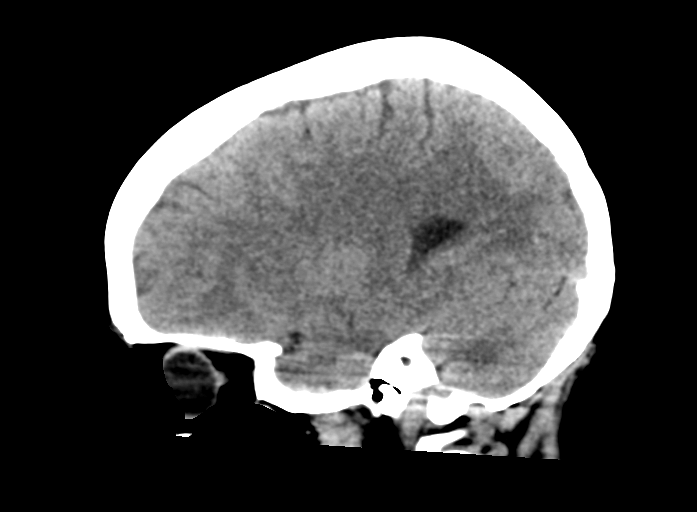
[im 23/46  brain]
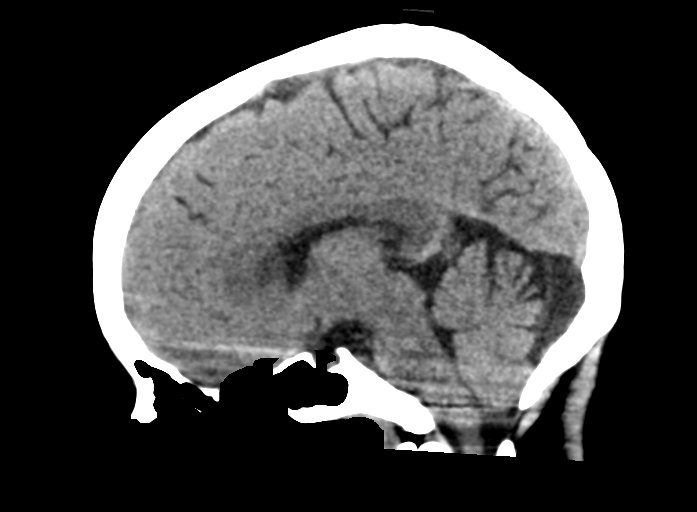
[im 31/46  brain]
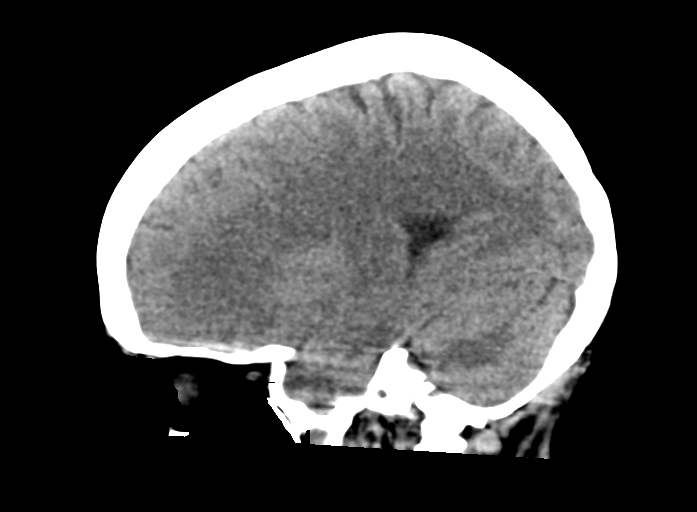

[16 of 45 positions shown; findings below may reference images not displayed]

FINDINGS: Brain: No evidence of acute infarction, hemorrhage, hydrocephalus,
extra-axial collection or mass lesion/mass effect.

Vascular: No hyperdense vessel or unexpected calcification.

Skull: Normal. Negative for fracture or focal lesion.

Sinuses/Orbits: No acute finding.

Other: None
IMPRESSION: Negative non contrasted CT appearance the brain

## 2020-11-02 NOTE — Progress Notes (Signed)
Primary Physician/Referring:  Lance Bosch, NP  Patient ID: Christine Beard, female    DOB: April 17, 1991, 30 y.o.   MRN: [id number]  Chief Complaint  Patient presents with  . New Patient (Initial Visit)  . Hypertension   HPI:    Christine Beard  is a 30 y.o. Caucasian female with history of preeclampsia and postpartum hypertension, as well as intermittent smoking for the last 10 years. She has history of tubal ligation. She was referred to our office by PCP for evaluation and management of hypertension.  Patient denies history of coronary artery disease, hyperlipidemia, diabetes, TIA/CVA, DVT, pulmonary embolism.  Patient does not have details regarding family history but does know there is family history of heart disease on both her mother and father sides.   Patient reports she has smoked on and off about a pack per day for the last 13 years, she was recently quit on October 04, 2020.  Patient has no formal exercise routine and admits to poor diet high in salt.  Patient reports she had her first child in 2016 following a birth of her daughter experience postpartum severe hypertension for which she was hospitalized for 1 week and treated with magnesium. Came patient and her second child and again experienced postpartum hypertension.  Patient monitors her blood pressure on a regular basis at home reporting readings averaging 150/70 mmHg.  She has previously been on labetalol in the past, however she is not currently on antihypertensive medications. Patient reports high level of stress in life lately as she is caring for multiple family members at this time and has 2 young children.   Patient also reports worsening fatigue over the last 2-3 months.  She has also been experiencing palpitations which she describes as heart racing symptoms.  She states these palpitations occur nearly every day and last for 1-2 hours without identifiable triggers.  Patient reports palpitations are occasionally  associated with shortness of breath.  She also complains of cold lower extremities, particularly her feet bilaterally.  Patient denies chest pain, PND, orthopnea, syncope, near syncope, leg swelling.  Notably she is presently in the process of being evaluated by neurology for paresthesias, dizziness, and recurrent headaches.    Past Medical History:  Diagnosis Date  . Anxiety   . Depression   . Headache   . Hypothyroidism   . S/P tubal ligation 10/06/2017  . SVD (spontaneous vaginal delivery) 10/05/2017  . Thyroid disease    Past Surgical History:  Procedure Laterality Date  . TOE SURGERY    . TONSILLECTOMY    . TUBAL LIGATION N/A 10/06/2017   Procedure: POST PARTUM TUBAL LIGATION;  Surgeon: Sherian Rein, MD;  Location: [facility];  Service: Gynecology;  Laterality: N/A;   Family History  Problem Relation Age of Onset  . Diabetes Maternal Grandmother   . Diabetes Paternal Grandmother   . Heart attack Father     Social History   Tobacco Use  . Smoking status: Former Smoker    Packs/day: 1.50    Types: Cigarettes    Quit date: 09/03/2014    Years since quitting: 6.1  . Smokeless tobacco: Never Used  Substance Use Topics  . Alcohol use: Not Currently   Marital Status: Significant Other   ROS  Review of Systems  Constitutional: Positive for malaise/fatigue. Negative for weight gain.  Cardiovascular: Positive for palpitations. Negative for chest pain, claudication, leg swelling, near-syncope, orthopnea, paroxysmal nocturnal dyspnea and syncope.  Respiratory: Positive for shortness of breath.  Hematologic/Lymphatic: Does not bruise/bleed easily.  Gastrointestinal: Negative for melena.  Neurological: Positive for dizziness, headaches and paresthesias. Negative for weakness.    Objective  Blood pressure (!) 151/86, pulse 88, temperature 98.3 F (36.8 C), temperature source Temporal, resp. rate 18, height 4\' 7"  (1.397 m), weight 115 lb 6.4 oz (52.3 kg), SpO2 98  %, unknown if currently breastfeeding.  Vitals with BMI 11/03/2020 09/20/2018 09/20/2018  Height 4\' 7"  - -  Weight 115 lbs 6 oz - -  BMI 26.82 - -  Systolic 151 135 09/22/2018  Diastolic 86 78 90  Pulse 88 78 105  Some encounter information is confidential and restricted. Go to Review Flowsheets activity to see all data.      Physical Exam Vitals reviewed.  HENT:     Head: Normocephalic and atraumatic.  Cardiovascular:     Rate and Rhythm: Normal rate and regular rhythm.     Pulses: Intact distal pulses.          Carotid pulses are 2+ on the right side and 2+ on the left side.      Radial pulses are 2+ on the right side and 2+ on the left side.       Femoral pulses are 2+ on the right side and 2+ on the left side.      Popliteal pulses are 2+ on the right side and 2+ on the left side.       Dorsalis pedis pulses are 2+ on the right side and 0 on the left side.       Posterior tibial pulses are 2+ on the right side and 0 on the left side.     Heart sounds: S1 normal and S2 normal. No murmur heard. No gallop.   Pulmonary:     Effort: Pulmonary effort is normal. No respiratory distress.     Breath sounds: No wheezing, rhonchi or rales.  Abdominal:     General: Abdomen is flat. Bowel sounds are normal.     Palpations: Abdomen is soft.  Musculoskeletal:     Right lower leg: No edema.     Left lower leg: No edema.  Skin:    Capillary Refill: Capillary refill takes less than 2 seconds.  Neurological:     General: No focal deficit present.     Mental Status: She is alert and oriented to person, place, and time.  Psychiatric:        Mood and Affect: Mood normal.        Behavior: Behavior normal.     Laboratory examination:   No results for input(s): NA, K, CL, CO2, GLUCOSE, BUN, CREATININE, CALCIUM, GFRNONAA, GFRAA in the last 8760 hours. CrCl cannot be calculated (Patient's most recent lab result is older than the maximum 21 days allowed.).  CMP Latest Ref Rng & Units 10/28/2018  10/30/2017 10/29/2017  Glucose 70 - 99 mg/dL 90 88 11/01/2017)  BUN 6 - 20 mg/dL 12 7 13   Creatinine 0.44 - 1.00 mg/dL 10/31/2017 707(E  Sodium 135 - 145 mmol/L 141 140 138  Potassium 3.5 - 5.1 mmol/L 3.9 3.6 3.7  Chloride 98 - 111 mmol/L 109 109 107  CO2 22 - 32 mmol/L 27 23 23   Calcium 8.9 - 10.3 mg/dL 9.0 9.1 9.1  Total Protein 6.5 - 8.1 g/dL 6.1(L) 7.1 7.2  Total Bilirubin 0.3 - 1.2 mg/dL 0.5 0.5 0.5  Alkaline Phos 38 - 126 U/L 33(L) 68 70  AST 15 - 41 U/L 12(L) 17 12(L)  ALT 0 - 44 U/L 12 14 15    CBC Latest Ref Rng & Units 10/28/2018 10/30/2017 10/29/2017  WBC 4.0 - 10.5 K/uL 5.7 6.6 8.5  Hemoglobin 12.0 - 15.0 g/dL 74.8 27.0 78.6  Hematocrit 36.0 - 46.0 % 38.4 38.7 37.5  Platelets 150 - 400 K/uL 170 208 213    Lipid Panel No results for input(s): CHOL, TRIG, LDLCALC, VLDL, HDL, CHOLHDL, LDLDIRECT in the last 8760 hours.  HEMOGLOBIN A1C Lab Results  Component Value Date   HGBA1C 4.5 (L) 10/28/2018   MPG 82.45 10/28/2018   TSH No results for input(s): TSH in the last 8760 hours.  External labs:   08/04/2020: Hemoglobin 12.3, hematocrit 37.4, MCV 91,  RDW 11.3, CBC otherwise normal Glucose 88, BUN 9, creatinine 0.58, GFR 123, sodium 140, potassium 4.7, chloride 109, calcium 8.4, CMP otherwise normal. TSH 4.08 (normal) Vitamin D 44.2 (normal)  Medications and allergies   Allergies  Allergen Reactions  . Cinnamon Itching  . Codeine Nausea And Vomiting     Outpatient Medications Prior to Visit  Medication Sig Dispense Refill  . busPIRone (BUSPAR) 15 MG tablet Take 15 mg by mouth 3 (three) times daily.    . calcium carbonate (OSCAL) 1500 (600 Ca) MG TABS tablet Take by mouth 2 (two) times daily with a meal.    . calcium-vitamin D (OSCAL WITH D) 500-200 MG-UNIT tablet Take 1 tablet by mouth.    . Cetirizine HCl (ALLERGY RELIEF) 10 MG CAPS Take by mouth.    . diphenhydrAMINE-APAP, sleep, (TYLENOL PM EXTRA STRENGTH) 50-1000 MG/30ML LIQD Take by mouth.    . ELDERBERRY PO Take  by mouth.    . Fluvoxamine Maleate 100 MG CP24 Take 100 mg by mouth. Two capsules at bed time    . ibuprofen (ADVIL) 200 MG tablet Take 200 mg by mouth every 6 (six) hours as needed.    . Multiple Vitamins-Minerals (ONE-A-DAY FOR HER VITACRAVES PO) Take by mouth.    . Omega-3 Fatty Acids (FISH OIL) 1000 MG CAPS Take by mouth.    . Omeprazole Magnesium (PRILOSEC) 10 MG PACK Take by mouth.    Marland Kitchen FLUoxetine (PROZAC) 40 MG capsule Take 1 capsule (40 mg total) by mouth daily for 30 days. 30 capsule 0  . levothyroxine (SYNTHROID, LEVOTHROID) 50 MCG tablet levothyroxine 50 mcg tablet  take 1 tablet by mouth once daily    . mirtazapine (REMERON) 15 MG tablet Take 1 tablet (15 mg total) by mouth at bedtime. 30 tablet 0  . hydrOXYzine (ATARAX/VISTARIL) 25 MG tablet Take 1 tablet (25 mg total) by mouth 3 (three) times daily as needed for anxiety. 30 tablet 0   No facility-administered medications prior to visit.     Radiology:   No results found.  Cardiac Studies:   None   EKG:   EKG 11/03/2020: Sinus rhythm at a rate of 81 bpm.  Normal axis.  No evidence of ischemia or injury pattern.  Assessment     ICD-10-CM   1. Hypertension, unspecified type  I10 EKG 12-Lead    PCV ECHOCARDIOGRAM COMPLETE    amLODipine (NORVASC) 5 MG tablet  2. Other fatigue  R53.83 PCV ECHOCARDIOGRAM COMPLETE  3. Shortness of breath  R06.02 PCV ECHOCARDIOGRAM COMPLETE  4. Palpitations  R00.2 PCV ECHOCARDIOGRAM COMPLETE    LONG TERM MONITOR (3-14 DAYS)    amLODipine (NORVASC) 5 MG tablet  5. Decreased pulses in feet  R09.89 PCV ANKLE BRACHIAL INDEX (ABI)     Medications Discontinued During  This Encounter  Medication Reason  . hydrOXYzine (ATARAX/VISTARIL) 25 MG tablet Change in therapy    Meds ordered this encounter  Medications  . amLODipine (NORVASC) 5 MG tablet    Sig: Take 1 tablet (5 mg total) by mouth daily.    Dispense:  30 tablet    Refill:  3    Recommendations:   THANDIWE SIRAGUSA is a 30  y.o. Caucasian female with history of preeclampsia and postpartum hypertension, as well as intermittent smoking for the last 10 years. She has history of tubal ligation. She was referred to our office by PCP for evaluation and management of hypertension.   Patient presents today for evaluation and management of hypertension, with complaints of palpitations, shortness of breath, and fatigue over the last 2-3 months. In order to further evaluate palpitations will obtain cardiac monitor particularly as she reports associated shortness of breath with episodes of palpitations lasting 1 to 2 hours nearly daily. We will also obtain echocardiogram to evaluate for cardiac etiology of fatigue, shortness of breath, as well as evaluate for hypertensive heart disease which may be contributing to patient's present symptoms.  In regard to hypertension, will start patient on amlodipine 5 mg daily as this will likely improve both blood pressure control and symptoms of palpitations. Patient will continue to monitor blood pressure on a daily basis and bring a log with her to her next appointment. Counseled patient regarding signs and symptoms of hypotension, she will notify our office if she experiences any issues with medication.  Patient also complains today of cold feet bilaterally. On exam patient has reduced pedal pulses on her left side, and strong pulses on her right. Patient has normal capillary refill. However in view of abnormal vascular exam particularly of left lower extremity as well as patient's complaint of lower extremities, will obtain ABI.  Counseled patient regarding importance of continued tobacco cessation as well as diet and lifestyle modifications in order to lose weight and reduce cardiovascular risk.  Of note patient is unclear of dosing from present medications. Patient has been advised to call our office when she is at home with medication bottles in front of her and complete medication  reconciliation on the phone. Also instructed patient to bring all medications with her to her next visit in our office.  Follow-up in 6 weeks, sooner if needed, for hypertension, palpitations, and results of cardiac testing.  During this visit I reviewed and updated: Tobacco history  allergies medication reconciliation  medical history  surgical history  family history  social history.  This note was created using a voice recognition software as a result there may be grammatical errors inadvertently enclosed that do not reflect the nature of this encounter. Every attempt is made to correct such errors.   Rayford Halsted, PA-C 11/03/2020, 1:39 PM Office: [phone]

## 2020-11-03 ENCOUNTER — Inpatient Hospital Stay: Payer: Medicaid Other

## 2020-11-03 ENCOUNTER — Other Ambulatory Visit: Payer: Self-pay

## 2020-11-03 ENCOUNTER — Ambulatory Visit: Payer: Medicaid Other | Admitting: Student

## 2020-11-03 ENCOUNTER — Encounter: Payer: Self-pay | Admitting: Student

## 2020-11-03 VITALS — BP 151/86 | HR 88 | Temp 98.3°F | Resp 18 | Ht <= 58 in | Wt 115.4 lb

## 2020-11-03 DIAGNOSIS — I10 Essential (primary) hypertension: Secondary | ICD-10-CM

## 2020-11-03 DIAGNOSIS — R002 Palpitations: Secondary | ICD-10-CM

## 2020-11-03 DIAGNOSIS — R0602 Shortness of breath: Secondary | ICD-10-CM

## 2020-11-03 DIAGNOSIS — R5383 Other fatigue: Secondary | ICD-10-CM

## 2020-11-03 DIAGNOSIS — R0989 Other specified symptoms and signs involving the circulatory and respiratory systems: Secondary | ICD-10-CM

## 2020-11-03 MED ORDER — AMLODIPINE BESYLATE 5 MG PO TABS: 5.0000 mg | ORAL_TABLET | Freq: Every day | ORAL | 3 refills | Status: DC

## 2020-11-19 NOTE — Progress Notes (Signed)
Please inform patient monitor showed PVC and PACs, extra heartbeats. No significant arrhythmia. Will discuss further at upcoming visit.

## 2020-11-19 NOTE — Progress Notes (Signed)
Called and spoke with pt regarding monitor results. Pt voiced understanding.

## 2020-11-20 ENCOUNTER — Ambulatory Visit: Payer: Medicaid Other

## 2020-11-20 ENCOUNTER — Other Ambulatory Visit: Payer: Self-pay

## 2020-11-20 DIAGNOSIS — I10 Essential (primary) hypertension: Secondary | ICD-10-CM

## 2020-11-20 DIAGNOSIS — R0602 Shortness of breath: Secondary | ICD-10-CM

## 2020-11-20 DIAGNOSIS — R002 Palpitations: Secondary | ICD-10-CM

## 2020-11-20 DIAGNOSIS — R5383 Other fatigue: Secondary | ICD-10-CM

## 2020-11-20 DIAGNOSIS — R0989 Other specified symptoms and signs involving the circulatory and respiratory systems: Secondary | ICD-10-CM

## 2020-11-24 ENCOUNTER — Telehealth: Payer: Self-pay

## 2020-11-24 NOTE — Telephone Encounter (Signed)
Pt called back regarding echo results, results given to pt but she did not understand and would like for you to call her //ah

## 2020-11-24 NOTE — Progress Notes (Signed)
Mildly decreased perfusion bilateral lower extremities.

## 2020-11-24 NOTE — Telephone Encounter (Signed)
I called and spoke with patient, she verbalized understanding of results.

## 2020-11-24 NOTE — Progress Notes (Signed)
Called patient and left a voicemail to call the office back.  Please try calling her this afternoon. If you are able to get ahold of her and I am available I am happy to talk to her.

## 2020-11-24 NOTE — Progress Notes (Signed)
Please call patient

## 2020-11-24 NOTE — Progress Notes (Signed)
Please inform patient echocardiogram is normal.  Shows only mild tricuspid regurgitation (leaking).  Also ABI shows mildly decreased perfusion of the right and left lower extremity.  We will discuss further at upcoming visit.

## 2020-11-25 ENCOUNTER — Ambulatory Visit: Payer: Medicaid Other | Admitting: Neurology

## 2020-11-25 ENCOUNTER — Encounter: Payer: Self-pay | Admitting: *Deleted

## 2020-11-25 ENCOUNTER — Encounter: Payer: Self-pay | Admitting: Neurology

## 2020-11-25 VITALS — BP 123/78 | HR 79 | Ht <= 58 in | Wt 114.0 lb

## 2020-11-25 DIAGNOSIS — R519 Headache, unspecified: Secondary | ICD-10-CM | POA: Diagnosis not present

## 2020-11-25 DIAGNOSIS — G43711 Chronic migraine without aura, intractable, with status migrainosus: Secondary | ICD-10-CM | POA: Diagnosis not present

## 2020-11-25 DIAGNOSIS — H9193 Unspecified hearing loss, bilateral: Secondary | ICD-10-CM

## 2020-11-25 DIAGNOSIS — G4440 Drug-induced headache, not elsewhere classified, not intractable: Secondary | ICD-10-CM

## 2020-11-25 DIAGNOSIS — R51 Headache with orthostatic component, not elsewhere classified: Secondary | ICD-10-CM

## 2020-11-25 DIAGNOSIS — H539 Unspecified visual disturbance: Secondary | ICD-10-CM

## 2020-11-25 DIAGNOSIS — G8929 Other chronic pain: Secondary | ICD-10-CM

## 2020-11-25 MED ORDER — TOPIRAMATE 50 MG PO TABS: 50.0000 mg | ORAL_TABLET | Freq: Every evening | ORAL | 6 refills | Status: DC

## 2020-11-25 MED ORDER — UBRELVY 100 MG PO TABS: 100.0000 mg | ORAL_TABLET | ORAL | 11 refills | Status: DC | PRN

## 2020-11-25 MED ORDER — METOCLOPRAMIDE HCL 10 MG PO TABS: 10.0000 mg | ORAL_TABLET | Freq: Four times a day (QID) | ORAL | 1 refills | Status: DC | PRN

## 2020-11-25 NOTE — Progress Notes (Signed)
Called and spoke with pt, pt voiced understanding.

## 2020-11-25 NOTE — Patient Instructions (Addendum)
MRI of the brain w/wo contrast Acute management: Ubrelvy: Please take one tablet at the onset of your headache. If it does not improve the symptoms please take one additional tablet. Do not take more then 2 tablets in 24hrs. Do not take use more then 2 to 3 times in a week. Acute management: Metoclopramide (try to stop all over the counter medication) Preventative: Topiramate at bedtime. If this does not work I recommend Emgality   Analgesic Rebound Headache An analgesic rebound headache, sometimes called a medication overuse headache or a drug-induced headache, is a secondary disorder that is caused by the overuse of pain medicine (analgesic) to treat the original (primary) headache. Any type of primary headache can return as a rebound headache if a person regularly takes analgesics. The types of primary headaches that are commonly associated with rebound headaches include:  Migraines.  Headaches that are caused by tense muscles in the head and neck area (tension headaches).  Headaches that develop and happen again on one side of the head and around the eye (cluster headaches). If rebound headaches continue, they can become long-term, daily headaches. What are the causes? This condition may be caused by frequent use of:  Over-the-counter medicines such as aspirin, ibuprofen, and acetaminophen.  Sinus-relief medicines and medicines that contain caffeine.  Narcotic pain medicines such as codeine and oxycodone.  Some prescription migraine medicines. What are the signs or symptoms? The symptoms of a rebound headache are the same as the symptoms of the original headache. Some of the symptoms of specific types of headaches include: Migraine headache  Pulsing or throbbing pain on one or both sides of the head.  Severe pain that interferes with daily activities.  Pain that gets worse with physical activity.  Nausea, vomiting, or both.  Pain and sensitivity with exposure to bright  light, loud noises, or strong smells.  Visual changes.  Numbness of one or both arms. Tension headache  Pressure around the head.  Dull, aching head pain.  Pain felt over the front and sides of the head.  Tenderness in the muscles of the head, neck, and shoulders. Cluster headache  Severe pain that begins in or around one eye or temple.  Droopy or swollen eyelid, or redness and tearing in the eye on the same side as the pain.  One-sided head pain.  Nausea.  Runny nose.  Sweaty, pale facial skin.  Restlessness. How is this diagnosed? This condition is diagnosed by:  Reviewing your medical history. This includes the nature of your primary headaches.  Reviewing the types of pain medicines that you have been using to treat your primary headaches and how often you take them. How is this treated? This condition may be treated or managed by:  Discontinuing frequent use of the analgesic medicine. Doing this may worsen your headaches at first, but the pain should eventually become more manageable, less frequent, and less severe.  Seeing a headache specialist. He or she may be able to help you manage your headaches and help make sure there is not another cause of the headaches.  Using methods of stress relief, such as acupuncture, counseling, biofeedback, and massage. Talk with your health care provider about which methods might be good for you. Follow these instructions at home: Medicines  Take over-the-counter and prescription medicines only as told by your health care provider.  Stop the repeated use of pain medicine as told by your health care provider. Stopping can be difficult. Carefully follow instructions from your health care  provider.   Lifestyle  Follow a regular sleep schedule. Do not vary the time that you go to bed or the amount that you sleep from day to day. It is important to stay on the same schedule to help prevent headaches. Get 7-9 hours of sleep each  night, or the amount recommended by your health care provider.  Exercise regularly. Exercise for at least 30 minutes, 5 times each week.  Limit or manage stress. Consider stress-relief options such as acupuncture, counseling, biofeedback, and massage. Talk with your health care provider about which methods might be good for you.  Do not drink alcohol.  Do not use any products that contain nicotine or tobacco, such as cigarettes, e-cigarettes, and chewing tobacco. If you need help quitting, ask your health care provider.   General instructions  Avoid triggers that are known to cause your primary headaches.  Keep all follow-up visits as told by your health care provider. This is important. Contact a health care provider if:  You continue to experience headaches after following treatments that your health care provider recommended. Get help right away if you have:  New headache pain.  Headache pain that is different than what you have experienced in the past.  Numbness or tingling in your arms or legs.  Changes in your speech or vision. Summary  An analgesic rebound headache, sometimes called a medication overuse headache or a drug-induced headache, is caused by the overuse of pain medicine (analgesic) to treat the original (primary) headache.  Any type of primary headache can return as a rebound headache if a person regularly takes analgesics. The types of primary headaches that are commonly associated with rebound headaches include migraines, tension headaches, and cluster headaches.  Analgesic rebound headaches can occur with frequent use of over-the-counter medicines and prescription medicines.  Treatment involves stopping the medicine that is being overused. This will improve headache frequency and severity. This information is not intended to replace advice given to you by your health care provider. Make sure you discuss any questions you have with your health care  provider. Document Revised: 10/18/2019 Document Reviewed: 10/18/2019 Elsevier Patient Education  2021 [place] injection What is this medicine? GALCANEZUMAB (gal ka NEZ ue mab) is used to prevent migraines and treat cluster headaches. This medicine may be used for other purposes; ask your health care provider or pharmacist if you have questions. COMMON BRAND NAME(S): Emgality What should I tell my health care provider before I take this medicine? They need to know if you have any of these conditions:  an unusual or allergic reaction to galcanezumab, other medicines, foods, dyes, or preservatives  pregnant or trying to get pregnant  breast-feeding How should I use this medicine? This medicine is for injection under the skin. You will be taught how to prepare and give this medicine. Use exactly as directed. Take your medicine at regular intervals. Do not take your medicine more often than directed. It is important that you put your used needles and syringes in a special sharps container. Do not put them in a trash can. If you do not have a sharps container, call your pharmacist or healthcare provider to get one. Talk to your pediatrician regarding the use of this medicine in children. Special care may be needed. Overdosage: If you think you have taken too much of this medicine contact a poison control center or emergency room at once. NOTE: This medicine is only for you. Do not share this medicine with others. What  if I miss a dose? If you miss a dose, take it as soon as you can. If it is almost time for your next dose, take only that dose. Do not take double or extra doses. What may interact with this medicine? Interactions are not expected. This list may not describe all possible interactions. Give your health care provider a list of all the medicines, herbs, non-prescription drugs, or dietary supplements you use. Also tell them if you smoke, drink alcohol, or use illegal  drugs. Some items may interact with your medicine. What should I watch for while using this medicine? Tell your doctor or healthcare professional if your symptoms do not start to get better or if they get worse. What side effects may I notice from receiving this medicine? Side effects that you should report to your doctor or health care professional as soon as possible:  allergic reactions like skin rash, itching or hives, swelling of the face, lips, or tongue Side effects that usually do not require medical attention (report these to your doctor or health care professional if they continue or are bothersome):  pain, redness, or irritation at site where injected This list may not describe all possible side effects. Call your doctor for medical advice about side effects. You may report side effects to FDA at 1-800-FDA-1088. Where should I keep my medicine? Keep out of the reach of children. You will be instructed on how to store this medicine. Throw away any unused medicine after the expiration date on the label. NOTE: This sheet is a summary. It may not cover all possible information. If you have questions about this medicine, talk to your doctor, pharmacist, or health care provider.  2021 Elsevier/Gold Standard (2018-03-08 12:03:23) Topiramate tablets What is this medicine? TOPIRAMATE (toe PYRE a mate) is used to treat seizures in adults or children with epilepsy. It is also used for the prevention of migraine headaches. This medicine may be used for other purposes; ask your health care provider or pharmacist if you have questions. COMMON BRAND NAME(S): Topamax, Topiragen What should I tell my health care provider before I take this medicine? They need to know if you have any of these conditions:  bleeding disorder  kidney disease  lung disease  suicidal thoughts, plans, or attempt  an unusual or allergic reaction to topiramate, other medicines, foods, dyes, or  preservatives  pregnant or trying to get pregnant  breast-feeding How should I use this medicine? Take this medicine by mouth with a glass of water. Follow the directions on the prescription label. Do not cut, crush or chew this medicine. Swallow the tablets whole. You can take it with or without food. If it upsets your stomach, take it with food. Take your medicine at regular intervals. Do not take it more often than directed. Do not stop taking except on your doctor's advice. A special MedGuide will be given to you by the pharmacist with each prescription and refill. Be sure to read this information carefully each time. Talk to your pediatrician regarding the use of this medicine in children. While this drug may be prescribed for children as young as 37 years of age for selected conditions, precautions do apply. Overdosage: If you think you have taken too much of this medicine contact a poison control center or emergency room at once. NOTE: This medicine is only for you. Do not share this medicine with others. What if I miss a dose? If you miss a dose, take it as soon as  you can. If your next dose is to be taken in less than 6 hours, then do not take the missed dose. Take the next dose at your regular time. Do not take double or extra doses. What may interact with this medicine? This medicine may interact with the following medications:  acetazolamide  alcohol  antihistamines for allergy, cough, and cold  aspirin and aspirin-like medicines  atropine  birth control pills  certain medicines for anxiety or sleep  certain medicines for bladder problems like oxybutynin, tolterodine  certain medicines for depression like amitriptyline, fluoxetine, sertraline  certain medicines for seizures like carbamazepine, phenobarbital, phenytoin, primidone, valproic acid, zonisamide  certain medicines for stomach problems like dicyclomine, hyoscyamine  certain medicines for travel sickness like  scopolamine  certain medicines for Parkinson's disease like benztropine, trihexyphenidyl  certain medicines that treat or prevent blood clots like warfarin, enoxaparin, dalteparin, apixaban, dabigatran, and rivaroxaban  digoxin  general anesthetics like halothane, isoflurane, methoxyflurane, propofol  hydrochlorothiazide  ipratropium  lithium  medicines that relax muscles for surgery  metformin  narcotic medicines for pain  NSAIDs, medicines for pain and inflammation, like ibuprofen or naproxen  phenothiazines like chlorpromazine, mesoridazine, prochlorperazine, thioridazine  pioglitazone This list may not describe all possible interactions. Give your health care provider a list of all the medicines, herbs, non-prescription drugs, or dietary supplements you use. Also tell them if you smoke, drink alcohol, or use illegal drugs. Some items may interact with your medicine. What should I watch for while using this medicine? Visit your doctor or health care professional for regular checks on your progress. Tell your health care professional if your symptoms do not start to get better or if they get worse. Do not stop taking except on your health care professional's advice. You may develop a severe reaction. Your health care professional will tell you how much medicine to take. Wear a medical ID bracelet or chain. Carry a card that describes your disease and details of your medicine and dosage times. This medicine can reduce the response of your body to heat or cold. Dress warm in cold weather and stay hydrated in hot weather. If possible, avoid extreme temperatures like saunas, hot tubs, very hot or cold showers, or activities that can cause dehydration such as vigorous exercise. Check with your health care professional if you have severe diarrhea, nausea, and vomiting, or if you sweat a lot. The loss of too much body fluid may make it dangerous for you to take this medicine. You may get  drowsy or dizzy. Do not drive, use machinery, or do anything that needs mental alertness until you know how this medicine affects you. Do not stand up or sit up quickly, especially if you are an older patient. This reduces the risk of dizzy or fainting spells. Alcohol may interfere with the effect of this medicine. Avoid alcoholic drinks. Tell your health care professional right away if you have any change in your eyesight. Patients and their families should watch out for new or worsening depression or thoughts of suicide. Also watch out for sudden changes in feelings such as feeling anxious, agitated, panicky, irritable, hostile, aggressive, impulsive, severely restless, overly excited and hyperactive, or not being able to sleep. If this happens, especially at the beginning of treatment or after a change in dose, call your healthcare professional. This medicine may cause serious skin reactions. They can happen weeks to months after starting the medicine. Contact your health care provider right away if you notice fevers or  flu-like symptoms with a rash. The rash may be red or purple and then turn into blisters or peeling of the skin. Or, you might notice a red rash with swelling of the face, lips or lymph nodes in your neck or under your arms. Birth control may not work properly while you are taking this medicine. Talk to your health care professional about using an extra method of birth control. Women should inform their health care professional if they wish to become pregnant or think they might be pregnant. There is a potential for serious side effects and harm to an unborn child. Talk to your health care professional for more information. What side effects may I notice from receiving this medicine? Side effects that you should report to your doctor or health care professional as soon as possible:  allergic reactions like skin rash, itching or hives, swelling of the face, lips, or tongue  blood in the  urine  changes in vision  confusion  loss of memory  pain in lower back or side  pain when urinating  redness, blistering, peeling or loosening of the skin, including inside the mouth  signs and symptoms of bleeding such as bloody or black, tarry stools; red or dark brown urine; spitting up blood or brown material that looks like coffee grounds; red spots on the skin; unusual bruising or bleeding from the eyes, gums, or nose  signs and symptoms of increased acid in the body like breathing fast; fast heartbeat; headache; confusion; unusually weak or tired; nausea, vomiting  suicidal thoughts, mood changes  trouble speaking or understanding  unusual sweating  unusually weak or tired Side effects that usually do not require medical attention (report to your doctor or health care professional if they continue or are bothersome):  dizziness  drowsiness  fever  loss of appetite  nausea, vomiting  pain, tingling, numbness in the hands or feet  stomach pain  tiredness  upset stomach This list may not describe all possible side effects. Call your doctor for medical advice about side effects. You may report side effects to FDA at 1-800-FDA-1088. Where should I keep my medicine? Keep out of the reach of children and pets. Store between 15 and 30 degrees C (59 and 86 degrees F). Protect from moisture. Keep the container tightly closed. Get rid of any unused medicine after the expiration date. To get rid of medicines that are no longer needed or have expired:  Take the medicine to a medicine take-back program. Check with your pharmacy or law enforcement to find a location.  If you cannot return the medicine, check the label or package insert to see if the medicine should be thrown out in the garbage or flushed down the toilet. If you are not sure, ask your health care provider. If it is safe to put it in the trash, empty the medicine out of the container. Mix the medicine with  cat litter, dirt, coffee grounds, or other unwanted substance. Seal the mixture in a bag or container. Put it in the trash. NOTE: This sheet is a summary. It may not cover all possible information. If you have questions about this medicine, talk to your doctor, pharmacist, or health care provider.  2021 Elsevier/Gold Standard (2020-04-03 15:41:57) Metoclopramide tablets What is this medicine? METOCLOPRAMIDE (met oh kloe PRA mide) is used to treat the symptoms of gastroesophageal reflux disease (GERD) like heartburn. It is also used to treat people with slow emptying of the stomach and intestinal tract. This  medicine may be used for other purposes; ask your health care provider or pharmacist if you have questions. COMMON BRAND NAME(S): Reglan What should I tell my health care provider before I take this medicine? They need to know if you have any of these conditions:  breast cancer  depression  diabetes  heart failure  high blood pressure  if you often drink alcohol  kidney disease  liver disease  Parkinson's disease or a movement disorder  pheochromocytoma  seizures  stomach obstruction, bleeding, or perforation  an unusual or allergic reaction to metoclopramide, procainamide, other medicines, foods, dyes, or preservatives  pregnant or trying to get pregnant  breast-feeding How should I use this medicine? Take this medicine by mouth with a glass of water. Follow the directions on the prescription label. Take this medicine on an empty stomach, about 30 minutes before eating. Take your doses at regular intervals. Do not take your medicine more often than directed. Do not stop taking except on the advice of your doctor or health care professional. A special MedGuide will be given to you by the pharmacist with each prescription and refill. Be sure to read this information carefully each time. Talk to your pediatrician regarding the use of this medicine in children. Special  care may be needed. Overdosage: If you think you have taken too much of this medicine contact a poison control center or emergency room at once. NOTE: This medicine is only for you. Do not share this medicine with others. What if I miss a dose? If you miss a dose, skip it. Take your next dose at the normal time. Do not take extra or 2 doses at the same time to make up for the missed dose. What may interact with this medicine?  alcohol  antihistamines for allergy, cough, and cold  atovaquone  atropine  bupropion  certain medicines for anxiety or sleep  certain medicines for bladder problems like oxybutynin, tolterodine  certain medicines for depression or psychotic disorders  certain medicines for Parkinson's disease  certain medicines for seizures like phenobarbital, primidone  certain medicines for stomach problems like dicyclomine, hyoscyamine  certain medicines for travel sickness like scopolamine  cyclosporine  digoxin  fosfomycin  general anesthetics like halothane, isoflurane, methoxyflurane, propofol  insulin and other medicines for diabetes  ipratropium  MAOIs like Carbex, Eldepryl, Marplan, Nardil, and Parnate  medicines that relax muscles for surgery  narcotic medicines for pain  paroxetine  phenothiazines like chlorpromazine, mesoridazine, prochlorperazine, thioridazine  posaconazole  quinidine  sirolimus  tacrolimus This list may not describe all possible interactions. Give your health care provider a list of all the medicines, herbs, non-prescription drugs, or dietary supplements you use. Also tell them if you smoke, drink alcohol, or use illegal drugs. Some items may interact with your medicine. What should I watch for while using this medicine? It may take a few weeks for your stomach condition to start to get better. However, do not take this medicine for longer than 12 weeks. The longer you take this medicine, and the more you take it,  the greater your chances are of developing serious side effects. If you are an elderly patient, a female patient, or you have diabetes, you may be at an increased risk for side effects from this medicine. Contact your doctor immediately if you start having movements you cannot control such as lip smacking, rapid movements of the tongue, involuntary or uncontrollable movements of the eyes, head, arms and legs, or muscle twitches and spasms.  Patients and their families should watch out for worsening depression or thoughts of suicide. Also watch out for any sudden or severe changes in feelings such as feeling anxious, agitated, panicky, irritable, hostile, aggressive, impulsive, severely restless, overly excited and hyperactive, or not being able to sleep. If this happens, especially at the beginning of treatment or after a change in dose, call your doctor. Do not treat yourself for high fever. Ask your doctor or health care professional for advice. You may get drowsy or dizzy. Do not drive, use machinery, or do anything that needs mental alertness until you know how this drug affects you. Do not stand or sit up quickly, especially if you are an older patient. This reduces the risk of dizzy or fainting spells. Alcohol can make you more drowsy and dizzy. Avoid alcoholic drinks. What side effects may I notice from receiving this medicine? Side effects that you should report to your doctor or health care professional as soon as possible:  allergic reactions like skin rash, itching or hives, swelling of the face, lips, or tongue  abnormal production of milk  breast enlargement in both males and females  change in sex drive or performance  depressed mood  menstrual changes  restlessness, pacing, inability to keep still  signs and symptoms of neuroleptic malignant syndrome (NMS) like confusion; fast, irregular heartbeat; high fever; increased sweating; uncontrollable head, mouth, neck, arm, or leg  movements; stiff muscles  seizures  suicidal thoughts, mood changes  swelling of the ankles, feet, hands  tremor  uncontrollable movements of the face, head, mouth, neck, arms, legs, or upper body  unusually weak or tired Side effects that usually do not require medical attention (report to your doctor or health care professional if they continue or are bothersome):  dizziness  drowsiness  headache  tiredness This list may not describe all possible side effects. Call your doctor for medical advice about side effects. You may report side effects to FDA at 1-800-FDA-1088. Where should I keep my medicine? Keep out of the reach of children. Store at room temperature between 20 and 25 degrees C (68 and 77 degrees F). Protect from light. Keep container tightly closed. Throw away any unused medicine after the expiration date. NOTE: This sheet is a summary. It may not cover all possible information. If you have questions about this medicine, talk to your doctor, pharmacist, or health care provider.  2021 Elsevier/Gold Standard (2019-05-14 10:16:27)

## 2020-11-25 NOTE — Progress Notes (Signed)
[facility]    Provider:  Dr Lucia Gaskins Requesting Provider: Lance Bosch, NP Primary Care Provider:  Lance Bosch, NP  CC:  migraines  HPI:  Christine Beard is a 30 y.o. female here as requested by Lance Bosch, NP for daily headaches for one year --- actually most of her life. PMHx thyroid disease, SVD, migraine, hypothyroidism, headache, depression, anxiety. She has had migraines for years. Worsened after her second child when she had a scare with her children and her child almost fell down the stairs and she had acute right-sided weakness and a headache in 2019. One day she had ringing in her right ear and ever since then she has daily headaches and migraines, very bad migraines, dizziness, she takes daily OTC medications. Migraines are usually on the right side, ppounding/pulsating/throbbing, photophobia/phonophobia, nausea all the time, she has vomited, the migraine and headache last all day long 24 hours and are daily and moderate to severe pain. Lots of stress at home too. She gets so dizzy she feels she is going to faint. She has vision changes, headaches are worse positionally esp in the morning.  No more children, had tubal ligation. No other focal neurologic deficits, associated symptoms, inciting events or modifiable factors.  Reviewed notes, labs and imaging from outside physicians, which showed:  I tried to review primary care notes, they are handwritten and difficult to accurately interpret but it appears as though head, eyes, ears, nose, throat were normal, pupils equally round reactive to light, cardiovascularly normal, respiratory lungs clear to auscultation, neuro remote and recent memory intact alert and oriented x4, paresthesias, she has headaches and migraines, stressors versus psych follow-up as indicated, referral to neurology as well as [facility], she was prescribed meclizine for vertigo and dizziness  Echo cardiogram normal : reviewed  report  From a thorough review of records, Medications tried that can be used in migraine management: tylenol, amlodipine, ibuprofen,fioricet, stadol, celexa, benadryl, prozac, ibuprofen, toradol, labetalol, magnesium, meclizine, reglan, naproxen, zofran, phenergan, propranolol, zoloft, tramadol,   CT head 10/31/2018: Ct showed No acute intracranial abnormalities including mass lesion or mass effect, hydrocephalus, extra-axial fluid collection, midline shift, hemorrhage, or acute infarction, large ischemic events (personally reviewed images)  06/11/2015: MR MRV  FINDINGS: Superior sagittal sinus, internal cerebral veins, vein of Galen, straight sinus, right transverse sinus, right sigmoid sinus, and jugular bulbs are patent without evidence of thrombus. Apparent lack of flow in the proximal right transverse sinus on MIP images is artifactual, without evidence of thrombus on the source images.  The left transverse and left sigmoid sinuses are hypoplastic with areas of signal dropout felt to be artifactual without discrete filling defects seen on source images to indicate thrombus.  IMPRESSION: No evidence of dural venous sinus thrombosis. 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  Review of Systems: Patient complains of symptoms per HPI as well as the following symptoms: headache, dizziness, medication overuse, social  stressors. Pertinent negatives and positives per HPI. All others negative.   Social History   Socioeconomic History  . Marital status: Significant Other    Spouse name: Not on file  . Number of children: 2  . Years of education: Not on file  . Highest education level: GED or equivalent  Occupational History  . Not on file  Tobacco Use  . Smoking status: Former Smoker    Packs/day: 1.50    Types: Cigarettes    Quit date: 10/03/2020    Years since quitting: 0.1  . Smokeless tobacco:  Never Used  Vaping Use  . Vaping Use: Never used  Substance and  Sexual Activity  . Alcohol use: Yes    Comment: "once in awhile"  . Drug use: Never  . Sexual activity: Yes    Birth control/protection: Surgical    Comment: tubal ligation  Other Topics Concern  . Not on file  Social History Narrative   Lives at home with fiance and 2 children   Right handed   Caffeine: "a lot of pepsi" but also tries to drink a lot of water as well   Social Determinants of Corporate investment banker Strain: Not on file  Food Insecurity: Not on file  Transportation Needs: Not on file  Physical Activity: Not on file  Stress: Not on file  Social Connections: Not on file  Intimate Partner Violence: Not on file    Family History  Problem Relation Age of Onset  . High blood pressure Maternal Grandmother   . Cancer Maternal Grandmother        "they removed it so she's fine"  . Diabetes Paternal Grandmother   . Cancer Paternal Grandmother   . Heart attack Father   . Migraines Father   . Heart attack Paternal Grandfather     Past Medical History:  Diagnosis Date  . Anxiety   . Depression   . Headache   . Hypothyroidism   . Migraine   . S/P tubal ligation 10/06/2017  . SVD (spontaneous vaginal delivery) 10/05/2017  . Thyroid disease     Patient Active Problem List   Diagnosis Date Noted  . Chronic migraine without aura, with intractable migraine, so stated, with status migrainosus 11/25/2020  . Severe recurrent major depression without psychotic features (HCC) 10/27/2018  . Postpartum hypertension 10/30/2017  . S/P tubal ligation 10/06/2017  . SVD (spontaneous vaginal delivery) 10/05/2017  . Labor and delivery indication for care or intervention 10/04/2017  . Preeclampsia in postpartum period 06/07/2015  . Intrauterine growth restriction affecting antepartum care of mother 05/29/2015  . PROM (premature rupture of membranes) 05/27/2015  . NAUSEA AND VOMITING 05/26/2009  . ABDOMINAL PAIN, LUQ 05/26/2009    Past Surgical History:  Procedure  Laterality Date  . TOE SURGERY  04/2005  . TONSILLECTOMY  11/2004  . TUBAL LIGATION N/A 10/06/2017   Procedure: POST PARTUM TUBAL LIGATION;  Surgeon: Sherian Rein, MD;  Location: [facility];  Service: Gynecology;  Laterality: N/A;    Current Outpatient Medications  Medication Sig Dispense Refill  . acetaminophen (TYLENOL) 500 MG tablet Take 1,000 mg by mouth 5 (five) times daily.    Marland Kitchen amLODipine (NORVASC) 5 MG tablet Take 1 tablet (5 mg total) by mouth daily. 30 tablet 3  . Biotin 5000 MCG TABS Take 5,000 mcg by mouth daily.    . busPIRone (BUSPAR) 15 MG tablet Take 15 mg by mouth 3 (three) times daily.    Marland Kitchen CALCIUM PO Take 1,000 Int'l Units by mouth daily.    . Cetirizine HCl (ALLERGY RELIEF) 10 MG CAPS Take by mouth.    . Cholecalciferol (VITAMIN D3 PO) Take 1,000 Int'l Units by mouth daily.    . fluticasone (VERAMYST) 27.5 MCG/SPRAY nasal spray Place 2 sprays into the nose daily.    . Fluvoxamine Maleate 150 MG CP24 Take 300 mg by mouth at bedtime.    Marland Kitchen ibuprofen (ADVIL) 200 MG tablet Take 800 mg by mouth in the morning, at noon, in the evening, and at bedtime.    . metoCLOPramide (REGLAN)  10 MG tablet Take 1 tablet (10 mg total) by mouth every 6 (six) hours as needed. For migraine or nausea. 30 tablet 1  . Multiple Vitamins-Minerals (ONE-A-DAY FOR HER VITACRAVES PO) Take by mouth.    . Omega-3 Fatty Acids (FISH OIL) 1000 MG CAPS Take by mouth daily.    . Omeprazole Magnesium (PRILOSEC) 10 MG PACK Take by mouth.    . topiramate (TOPAMAX) 50 MG tablet Take 1 tablet (50 mg total) by mouth at bedtime. 30 tablet 6  . Ubrogepant (UBRELVY) 100 MG TABS Take 100 mg by mouth every 2 (two) hours as needed. Maximum 200mg  a day. 16 tablet 11   No current facility-administered medications for this visit.    Allergies as of 11/25/2020 - Review Complete 11/25/2020  Allergen Reaction Noted  . Cinnamon Itching 10/30/2017  . Codeine Nausea And Vomiting     Vitals: BP 123/78 (BP  Location: Left Arm, Patient Position: Sitting)   Pulse 79   Ht 4' 7.5" (1.41 m)   Wt 114 lb (51.7 kg)   BMI 26.02 kg/m  Last Weight:  Wt Readings from Last 1 Encounters:  11/25/20 114 lb (51.7 kg)   Last Height:   Ht Readings from Last 1 Encounters:  11/25/20 4' 7.5" (1.41 m)     Physical exam: Exam: Gen: NAD, conversant                CV: RRR, no MRG. No Carotid Bruits. No peripheral edema, warm, nontender Eyes: Conjunctivae clear without exudates or hemorrhage  Neuro: Detailed Neurologic Exam  Speech:    Speech is normal; fluent and spontaneous with normal comprehension.  Cognition:    The patient is oriented to person, place, and time;     recent and remote memory intact;     language fluent;     normal attention, concentration,     fund of knowledge Cranial Nerves:    The pupils are equal, round, and reactive to light. The fundi are normal and spontaneous venous pulsations are present. Visual fields are full to finger confrontation. Extraocular movements are intact. Trigeminal sensation is intact and the muscles of mastication are normal. The face is symmetric. The palate elevates in the midline. Hearing intact. Voice is normal. Shoulder shrug is normal. The tongue has normal motion without fasciculations.   Coordination:    Normal finger to nose   Gait:   Normal native gait  Motor Observation:    No asymmetry, no atrophy, and no involuntary movements noted. Tone:    Normal muscle tone.    Posture:    Posture is normal. normal erect    Strength:    Strength is V/V in the upper and lower limbs.      Sensation: intact to LT     Reflex Exam:  DTR's:    Deep tendon reflexes in the upper and lower extremities are normal bilaterally.   Toes:    The toes are downgoing bilaterally.   Clonus:    Clonus is absent.    Assessment/Plan:  Chronic daily headaches due to chronic migraines and medication overuse  I had a long discussion with patient that her daily  use of ibuprofen or Tylenol can cause medication overuse/rebound headache which is likely contributing to her chronic daily headaches. They only thing to do is to stop the medication unfortunately. In the timeframe after stopping, her headaches may get much worse.. She should significantly  improve with her chronic daily headaches after 2-4 weeks of being  off her daily over-the-counter medications. Do not use these medications more than 2 times in a week.  Migraines: Discussed migraine management including acute management and preventative management. We'll start patient on both. Discussed Topamax side effects especially teratogenicity do not get pregnant and use birth control (she had tubal ligation).    As far as your medications are concerned, I would like to suggest: - Stop daily over the counter med use (Tylenol, excedrin, ibuprofen, alleve) Do not tak emore than 2-3x in one week - - Try topamax. If not tolerated or fails it, try emgality (she has constipation, cannot use aimovig) as she has failed multiple classes of medications -At onset of Migraine Ubrelvy. Please take one tablet at the onset of your headache. If it does not improve the symptoms please take one additional tablet. Do not take more then 2 tablets in 24hrs. Do not take use more then 2 to 3 days in a week.Also reglan prn.(Appears to describe an episode of hemiplegia, need to evaluate with MRI for stroke. Triptans contraindicated in stroke or hemiplegic migraine) - Takes 4-6 weeks for medications to work.   As far as diagnostic testing: MRI brain : MRI brain due to concerning symptoms of morning headaches, positional headaches,vision changes, h,miplegic episode, hearing changes, to look for space occupying mass, chiari or intracranial hypertension (pseudotumor), strokes or other   Discussed; To prevent or relieve headaches, try the following:  Cool Compress. Lie down and place a cool compress on your head.   Avoid headache  triggers. If certain foods or odors seem to have triggered your migraines in the past, avoid them. A headache diary might help you identify triggers.   Include physical activity in your daily routine. Try a daily walk or other moderate aerobic exercise.   Manage stress. Find healthy ways to cope with the stressors, such as delegating tasks on your to-do list.   Practice relaxation techniques. Try deep breathing, yoga, massage and visualization.   Eat regularly. Eating regularly scheduled meals and maintaining a healthy diet might help prevent headaches. Also, drink plenty of fluids.   Follow a regular sleep schedule. Sleep deprivation might contribute to headaches  Consider biofeedback. With this mind-body technique, you learn to control certain bodily functions -- such as muscle tension, heart rate and blood pressure -- to prevent headaches or reduce headache pain.    Proceed to emergency room if you experience new or worsening symptoms or symptoms do not resolve, if you have new neurologic symptoms or if headache is severe, or for any concerning symptom.   Orders Placed This Encounter  Procedures  . MR BRAIN W WO CONTRAST  . CBC  . Comprehensive metabolic panel   Meds ordered this encounter  Medications  . topiramate (TOPAMAX) 50 MG tablet    Sig: Take 1 tablet (50 mg total) by mouth at bedtime.    Dispense:  30 tablet    Refill:  6  . metoCLOPramide (REGLAN) 10 MG tablet    Sig: Take 1 tablet (10 mg total) by mouth every 6 (six) hours as needed. For migraine or nausea.    Dispense:  30 tablet    Refill:  1  . Ubrogepant (UBRELVY) 100 MG TABS    Sig: Take 100 mg by mouth every 2 (two) hours as needed. Maximum 200mg  a day.    Dispense:  16 tablet    Refill:  11    Cc: Lance BoschBlevins, Andrea, NP,  Lance BoschBlevins, Andrea, NP  Naomie DeanAntonia Wadie Liew, MD  [facility] [address] [place], [place] [address]  Phone [phone] Fax [phone]

## 2020-11-26 ENCOUNTER — Telehealth: Payer: Self-pay | Admitting: Neurology

## 2020-11-26 NOTE — Telephone Encounter (Signed)
[facility] community auth: [id number] (exp. 11/26/20 to 01/10/21) order sent to GI. They will reach out to the patient to schedule.

## 2020-11-27 ENCOUNTER — Other Ambulatory Visit: Payer: Self-pay | Admitting: Neurology

## 2020-11-27 MED ORDER — ALPRAZOLAM 0.25 MG PO TABS: ORAL_TABLET | ORAL | 0 refills | Status: DC

## 2020-12-05 ENCOUNTER — Other Ambulatory Visit: Payer: Self-pay

## 2020-12-05 ENCOUNTER — Ambulatory Visit
Admission: RE | Admit: 2020-12-05 | Discharge: 2020-12-05 | Disposition: A | Discharge status: Home / Self Care | Payer: Medicaid Other | Source: Ambulatory Visit | Attending: Neurology | Admitting: Neurology

## 2020-12-05 DIAGNOSIS — H9193 Unspecified hearing loss, bilateral: Secondary | ICD-10-CM

## 2020-12-05 DIAGNOSIS — R519 Headache, unspecified: Secondary | ICD-10-CM

## 2020-12-05 DIAGNOSIS — G4440 Drug-induced headache, not elsewhere classified, not intractable: Secondary | ICD-10-CM

## 2020-12-05 DIAGNOSIS — R51 Headache with orthostatic component, not elsewhere classified: Secondary | ICD-10-CM

## 2020-12-05 DIAGNOSIS — H539 Unspecified visual disturbance: Secondary | ICD-10-CM

## 2020-12-05 DIAGNOSIS — G8929 Other chronic pain: Secondary | ICD-10-CM

## 2020-12-05 MED ORDER — GADOBENATE DIMEGLUMINE 529 MG/ML IV SOLN
10.0000 mL | Freq: Once | INTRAVENOUS | Status: AC | PRN
  Administered 2020-12-05: 10 mL via INTRAVENOUS

## 2020-12-09 ENCOUNTER — Telehealth: Payer: Self-pay | Admitting: Neurology

## 2020-12-09 NOTE — Telephone Encounter (Signed)
Pt accepted appointment for tomorrow, advised to check-in at least 15 mins prior to appointment time.

## 2020-12-09 NOTE — Telephone Encounter (Signed)
Please offer pt to see Dr Lucia Gaskins tomorrow 3/9 at 10:30 AM.

## 2020-12-09 NOTE — Telephone Encounter (Signed)
Pt called for an appt to receive her MRI Results. Provider does not have anything available for quite sometime and provider stated she would like to see pt in the next week or two. Can you please call pt back to schedule this appt

## 2020-12-09 NOTE — Progress Notes (Signed)
[facility]    Provider:  Dr Lucia Gaskins Requesting Provider: Lance Bosch, NP Primary Care Provider:  Lance Bosch, NP  CC:  migraines 12/10/2020: Patient is here for follow up of MRI brain and concerning finding of  4 mm T2 hyperintense focus in the left thalamus consistent with a small focus of gliosis or lacunar infarction. Patient Is with her mother in law. We reviewed images in depth. I answered all questions. Will complete stroke workup. Follow up 3 months or sooner if needed. Will ask you to see Dr. Pearlean Brownie for second opinion.  Echo: normal Hear monitor PVCs/PACs  Discussed following orders:  CT Arteries of the head and neck Extensive panel for clotting disorders If above negative, Trans Cranial Doppler Daily baby aspirin   MRI brain 12/13/2020:  FINDINGS: On sagittal images, the spinal cord is imaged caudally to C4 and is normal in caliber.   The contents of the posterior fossa are of normal size and position.   The pituitary gland and optic chiasm appear normal.    Brain volume appears normal.   The ventricles are normal in size and without distortion.  There is a Mega cisterna magna.  There is a T2 hyperintense focus within the left thalamus measuring 4 mm.  Though more subtle on the 10/31/2018 CT scan and not clearly seen on the axial images.  It does appear to be present on the coronal and sagittal reconstructions at that time.  The cerebellum and brainstem appears normal.   In the hemispheres, there are a couple punctate T2/FLAIR hyperintense foci in the subcortical white matter.  None of these appear to be acute..   Diffusion weighted images are normal.  Susceptibility weighted images are normal.     The orbits appear normal.   The VIIth/VIIIth nerve complex appears normal.  The mastoid air cells appear normal.  The paranasal sinuses appear normal.  Flow voids are identified within the major intracerebral arteries.  Dural veins and sinuses enhance  normally.  After the infusion of contrast material, a normal enhancement pattern is noted.   IMPRESSION: This MRI of the brain with and without contrast shows the following: 1.   4 mm T2 hyperintense focus in the left thalamus consistent with a small focus of gliosis or lacunar infarction.  Though more subtle on CT scan, it appears to be present on the 10/31/2018 images. 2.   Several punctate T2/flair hyperintense foci in the subcortical white matter.  This is nonspecific and could be the sequela of migraine or chronic ischemic change. 3.   Normal enhancement pattern.  No acute findings.    HPI:  Christine Beard is a 30 y.o. female here as requested by Lance Bosch, NP for daily headaches for one year --- actually most of her life. PMHx thyroid disease, SVD, migraine, hypothyroidism, headache, depression, anxiety. She has had migraines for years. Worsened after her second child when she had a scare with her children and her child almost fell down the stairs and she had acute right-sided weakness and a headache in 2019. One day she had ringing in her right ear and ever since then she has daily headaches and migraines, very bad migraines, dizziness, she takes daily OTC medications. Migraines are usually on the right side, ppounding/pulsating/throbbing, photophobia/phonophobia, nausea all the time, she has vomited, the migraine and headache last all day long 24 hours and are daily and moderate to severe pain. Lots of stress at home too. She gets so dizzy she feels she  is going to faint. She has vision changes, headaches are worse positionally esp in the morning.  No more children, had tubal ligation. No other focal neurologic deficits, associated symptoms, inciting events or modifiable factors.  Reviewed notes, labs and imaging from outside physicians, which showed:  I tried to review primary care notes, they are handwritten and difficult to accurately interpret but it appears as though head,  eyes, ears, nose, throat were normal, pupils equally round reactive to light, cardiovascularly normal, respiratory lungs clear to auscultation, neuro remote and recent memory intact alert and oriented x4, paresthesias, she has headaches and migraines, stressors versus psych follow-up as indicated, referral to neurology as well as [facility], she was prescribed meclizine for vertigo and dizziness  Echo cardiogram normal : reviewed report  From a thorough review of records, Medications tried that can be used in migraine management: tylenol, amlodipine, ibuprofen,fioricet, stadol, celexa, benadryl, prozac, ibuprofen, toradol, labetalol, magnesium, meclizine, reglan, naproxen, zofran, phenergan, propranolol, zoloft, tramadol,   CT head 10/31/2018: Ct showed No acute intracranial abnormalities including mass lesion or mass effect, hydrocephalus, extra-axial fluid collection, midline shift, hemorrhage, or acute infarction, large ischemic events (personally reviewed images)  06/11/2015: MR MRV  FINDINGS: Superior sagittal sinus, internal cerebral veins, vein of Galen, straight sinus, right transverse sinus, right sigmoid sinus, and jugular bulbs are patent without evidence of thrombus. Apparent lack of flow in the proximal right transverse sinus on MIP images is artifactual, without evidence of thrombus on the source images.  The left transverse and left sigmoid sinuses are hypoplastic with areas of signal dropout felt to be artifactual without discrete filling defects seen on source images to indicate thrombus.  IMPRESSION: No evidence of dural venous sinus thrombosis. 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  Review of Systems: Patient complains of symptoms per HPI as well as the following symptoms: dizziness . Pertinent negatives and positives per HPI. All others negative    Social History   Socioeconomic History  . Marital status: Significant Other    Spouse name:  Not on file  . Number of children: 2  . Years of education: Not on file  . Highest education level: GED or equivalent  Occupational History  . Not on file  Tobacco Use  . Smoking status: Former Smoker    Packs/day: 1.50    Types: Cigarettes    Quit date: 10/03/2020    Years since quitting: 0.1  . Smokeless tobacco: Never Used  Vaping Use  . Vaping Use: Never used  Substance and Sexual Activity  . Alcohol use: Yes    Comment: "once in awhile"  . Drug use: Never  . Sexual activity: Yes    Birth control/protection: Surgical    Comment: tubal ligation  Other Topics Concern  . Not on file  Social History Narrative   Lives at home with fiance and 2 children   Right handed   Caffeine: "a lot of pepsi" but also tries to drink a lot of water as well   Social Determinants of Corporate investment banker Strain: Not on file  Food Insecurity: Not on file  Transportation Needs: Not on file  Physical Activity: Not on file  Stress: Not on file  Social Connections: Not on file  Intimate Partner Violence: Not on file    Family History  Problem Relation Age of Onset  . High blood pressure Maternal Grandmother   . Cancer Maternal Grandmother        "they removed it so she's  fine"  . Diabetes Paternal Grandmother   . Cancer Paternal Grandmother   . Heart attack Father   . Migraines Father   . Heart attack Paternal Grandfather     Past Medical History:  Diagnosis Date  . Anxiety   . Depression   . Headache   . Hypothyroidism   . Migraine   . S/P tubal ligation 10/06/2017  . SVD (spontaneous vaginal delivery) 10/05/2017  . Thyroid disease     Patient Active Problem List   Diagnosis Date Noted  . Chronic migraine without aura, with intractable migraine, so stated, with status migrainosus 11/25/2020  . Severe recurrent major depression without psychotic features (HCC) 10/27/2018  . Postpartum hypertension 10/30/2017  . S/P tubal ligation 10/06/2017  . SVD (spontaneous vaginal  delivery) 10/05/2017  . Labor and delivery indication for care or intervention 10/04/2017  . Preeclampsia in postpartum period 06/07/2015  . Intrauterine growth restriction affecting antepartum care of mother 05/29/2015  . PROM (premature rupture of membranes) 05/27/2015  . NAUSEA AND VOMITING 05/26/2009  . ABDOMINAL PAIN, LUQ 05/26/2009    Past Surgical History:  Procedure Laterality Date  . TOE SURGERY  04/2005  . TONSILLECTOMY  11/2004  . TUBAL LIGATION N/A 10/06/2017   Procedure: POST PARTUM TUBAL LIGATION;  Surgeon: Sherian Rein, MD;  Location: [facility];  Service: Gynecology;  Laterality: N/A;    Current Outpatient Medications  Medication Sig Dispense Refill  . acetaminophen (TYLENOL) 500 MG tablet Take 1,000 mg by mouth 5 (five) times daily.    Marland Kitchen ALPRAZolam (XANAX) 0.25 MG tablet Take 1-2 tabs (0.25mg -0.50mg ) 30-60 minutes before procedure. May repeat if needed.Do not drive. 10 tablet 0  . amLODipine (NORVASC) 5 MG tablet Take 1 tablet (5 mg total) by mouth daily. 30 tablet 3  . aspirin EC 81 MG tablet Take 1 tablet (81 mg total) by mouth daily. Swallow whole. 30 tablet 11  . Biotin 5000 MCG TABS Take 5,000 mcg by mouth daily.    . busPIRone (BUSPAR) 15 MG tablet Take 15 mg by mouth 3 (three) times daily.    Marland Kitchen CALCIUM PO Take 1,000 Int'l Units by mouth daily.    . Cetirizine HCl (ALLERGY RELIEF) 10 MG CAPS Take by mouth.    . Cholecalciferol (VITAMIN D3 PO) Take 1,000 Int'l Units by mouth daily.    . fluticasone (VERAMYST) 27.5 MCG/SPRAY nasal spray Place 2 sprays into the nose daily.    . Fluvoxamine Maleate 150 MG CP24 Take 300 mg by mouth at bedtime.    Marland Kitchen ibuprofen (ADVIL) 200 MG tablet Take 800 mg by mouth in the morning, at noon, in the evening, and at bedtime.    . metoCLOPramide (REGLAN) 10 MG tablet Take 1 tablet (10 mg total) by mouth every 6 (six) hours as needed. For migraine or nausea. 30 tablet 1  . Multiple Vitamins-Minerals (ONE-A-DAY FOR HER  VITACRAVES PO) Take by mouth.    . Omega-3 Fatty Acids (FISH OIL) 1000 MG CAPS Take by mouth daily.    . Omeprazole Magnesium (PRILOSEC) 10 MG PACK Take by mouth.    . topiramate (TOPAMAX) 50 MG tablet Take 1 tablet (50 mg total) by mouth at bedtime. 30 tablet 6  . Ubrogepant (UBRELVY) 100 MG TABS Take 100 mg by mouth every 2 (two) hours as needed. Maximum  a day. 16 tablet 11   No current facility-administered medications for this visit.    Allergies as of 12/10/2020 - Review Complete 12/10/2020  Allergen Reaction Noted  .  Cinnamon Itching 10/30/2017  . Codeine Nausea And Vomiting     Vitals: There were no vitals taken for this visit. Last Weight:  Wt Readings from Last 1 Encounters:  11/25/20 114 lb (51.7 kg)   Last Height:   Ht Readings from Last 1 Encounters:  11/25/20 4' 7.5" (1.41 m)   Exam: NAD, pleasant                  Speech:    Speech is normal; fluent and spontaneous with normal comprehension.  Cognition:    The patient is oriented to person, place, and time;     recent and remote memory intact;     language fluent;    Cranial Nerves:    The pupils are equal, round, and reactive to light.Trigeminal sensation is intact and the muscles of mastication are normal. The face is symmetric. The palate elevates in the midline. Hearing intact. Voice is normal. Shoulder shrug is normal. The tongue has normal motion without fasciculations.   Coordination:  No dysmetria  Motor Observation:    No asymmetry, no atrophy, and no involuntary movements noted. Tone:    Normal muscle tone.     Strength:    Strength is V/V in the upper and lower limbs.      Sensation: intact to LT      Assessment/Plan:  Chronic daily headaches due to chronic migraines and medication overuse  I had a long discussion with patient that her daily use of ibuprofen or Tylenol can cause medication overuse/rebound headache which is likely contributing to her chronic daily headaches. They only  thing to do is to stop the medication unfortunately. In the timeframe after stopping, her headaches may get much worse.. She should significantly  improve with her chronic daily headaches after 2-4 weeks of being off her daily over-the-counter medications. Do not use these medications more than 2 times in a week.  Patient is here for follow up of MRI brain and concerning finding of  4 mm T2 hyperintense focus in the left thalamus consistent with a small focus of gliosis or lacunar infarction. Patient Is with her mother in law. We reviewed images in depth. I answered all questions. Will complete stroke workup. Follow up 3 months or sooner if needed. Will ask you to see Dr. Pearlean Brownie for second opinion considering your age.  Echo: normal Hear monitor PVCs/PACs  Discussed following orders:  CT Arteries of the head and neck Extensive panel for clotting disorders If above negative, Trans Cranial Doppler Daily baby aspirin 3-4 months with Dr. Pearlean Brownie as second opinion then can follow up with Shanda Bumps  Orders Placed This Encounter  Procedures  . CT ANGIO HEAD W OR WO CONTRAST  . CT ANGIO NECK W OR WO CONTRAST  . Hemoglobin A1c  . Lipid panel  . Prothrombin gene mutation  . Factor V Leiden  . Homocysteine  . Beta-2-glycoprotein i abs, IgG/M/A  . Lupus anticoagulant  . Protein S activity  . Protein S, total  . Protein C, total  . Protein C activity  . Antithrombin III  . Comprehensive metabolic panel  . CBC  . Cardiolipin antibodies, IgM+IgG  . VAS [place] TRANSCRANIAL DOPPLER W BUBBLES   Meds ordered this encounter  Medications  . aspirin EC 81 MG tablet    Sig: Take 1 tablet (81 mg total) by mouth daily. Swallow whole.    Dispense:  30 tablet    Refill:  11  . ALPRAZolam (XANAX) 0.25 MG tablet  Sig: Take 1-2 tabs (0.25mg -0.50mg ) 30-60 minutes before procedure. May repeat if needed.Do not drive.    Dispense:  10 tablet    Refill:  0   PRIOR:  Migraines: Discussed migraine management  including acute management and preventative management. We'll start patient on both. Discussed Topamax side effects especially teratogenicity do not get pregnant and use birth control (she had tubal ligation).    As far as your medications are concerned, I would like to suggest: - Stop daily over the counter med use (Tylenol, excedrin, ibuprofen, alleve) Do not tak emore than 2-3x in one week - - Try topamax. If not tolerated or fails it, try emgality (she has constipation, cannot use aimovig) as she has failed multiple classes of medications -At onset of Migraine Ubrelvy. Please take one tablet at the onset of your headache. If it does not improve the symptoms please take one additional tablet. Do not take more then 2 tablets in 24hrs. Do not take use more then 2 to 3 days in a week.Also reglan prn.(Appears to describe an episode of hemiplegia, need to evaluate with MRI for stroke. Triptans contraindicated in stroke or hemiplegic migraine) - Takes 4-6 weeks for medications to work.   As far as diagnostic testing: MRI brain : MRI brain due to concerning symptoms of morning headaches, positional headaches,vision changes, h,miplegic episode, hearing changes, to look for space occupying mass, chiari or intracranial hypertension (pseudotumor), strokes or other   Discussed; To prevent or relieve headaches, try the following:  Cool Compress. Lie down and place a cool compress on your head.   Avoid headache triggers. If certain foods or odors seem to have triggered your migraines in the past, avoid them. A headache diary might help you identify triggers.   Include physical activity in your daily routine. Try a daily walk or other moderate aerobic exercise.   Manage stress. Find healthy ways to cope with the stressors, such as delegating tasks on your to-do list.   Practice relaxation techniques. Try deep breathing, yoga, massage and visualization.   Eat regularly. Eating regularly scheduled meals  and maintaining a healthy diet might help prevent headaches. Also, drink plenty of fluids.   Follow a regular sleep schedule. Sleep deprivation might contribute to headaches  Consider biofeedback. With this mind-body technique, you learn to control certain bodily functions -- such as muscle tension, heart rate and blood pressure -- to prevent headaches or reduce headache pain.    Proceed to emergency room if you experience new or worsening symptoms or symptoms do not resolve, if you have new neurologic symptoms or if headache is severe, or for any concerning symptom.      Cc: Lance BoschBlevins, Andrea, NP,  Lance BoschBlevins, Andrea, NP  Christine DeanAntonia Peony Barner, MD  [facility] [address] [place], [place] [id number]  Phone [phone] Fax [phone]  I spent over 40 minutes of face-to-face and non-face-to-face time with patient on the  1. Lacunar stroke (HCC)   2. Hypercoagulable state (HCC)   3. Cerebrovascular accident (CVA), unspecified mechanism (HCC)    diagnosis.  This included previsit chart review, lab review, study review, order entry, electronic health record documentation, patient education on the different diagnostic and therapeutic options, counseling and coordination of care, risks and benefits of management, compliance, or risk factor reduction

## 2020-12-10 ENCOUNTER — Ambulatory Visit: Payer: Medicaid Other | Admitting: Neurology

## 2020-12-10 ENCOUNTER — Telehealth: Payer: Self-pay | Admitting: Neurology

## 2020-12-10 ENCOUNTER — Encounter: Payer: Self-pay | Admitting: Neurology

## 2020-12-10 DIAGNOSIS — I6381 Other cerebral infarction due to occlusion or stenosis of small artery: Secondary | ICD-10-CM | POA: Diagnosis not present

## 2020-12-10 DIAGNOSIS — D6859 Other primary thrombophilia: Secondary | ICD-10-CM | POA: Diagnosis not present

## 2020-12-10 DIAGNOSIS — I639 Cerebral infarction, unspecified: Secondary | ICD-10-CM | POA: Diagnosis not present

## 2020-12-10 MED ORDER — ASPIRIN EC 81 MG PO TBEC: 81.0000 mg | DELAYED_RELEASE_TABLET | Freq: Every day | ORAL | 11 refills | Status: DC

## 2020-12-10 MED ORDER — ALPRAZOLAM 0.25 MG PO TABS: ORAL_TABLET | ORAL | 0 refills | Status: DC

## 2020-12-10 NOTE — Telephone Encounter (Signed)
Does patient need to keep appointment with [facility] in June?

## 2020-12-10 NOTE — Patient Instructions (Signed)
CT Angio head and neck: To examine the blood vessels for clots or other Blood work: to look for clotting disorders Transcranial Dopplers: to look for patent foramen ovale Daily baby aspirin   Ischemic Stroke  An ischemic stroke (cerebrovascular accident, or CVA) is the sudden death of brain tissue that occurs when an area of the brain does not get enough blood flow. This condition is a medical emergency that must be treated right away. An ischemic stroke can cause permanent loss of brain function. Losing brain function can cause problems with how different parts of the body work. What are the causes? This condition is caused by a decrease of blood flow to an area of the brain, which may be the result of:  A small blood clot (embolus) or a buildup of plaque in the blood vessels, called atherosclerosis, that blocks blood flow in the brain.  An abnormal heart rhythm called atrial fibrillation, which sends a small blood clot to the brain.  A blocked or damaged artery in the head or neck.  Certain infections.  Inflammation of the arteries in the brain (vasculitis). Sometimes, the cause of ischemic stroke is not known. What increases the risk? The following factors may make you more likely to develop this condition: Factors that you can change  High blood pressure (hypertension) or certain other medical conditions, such as: ? Heart disease. ? Diabetes mellitus. ? High cholesterol. ? Obesity. ? Sleep apnea. ? Migraine headache.  Smoking cigarettes or using other tobacco products.  Physical inactivity.  Heavy alcohol use.  Use of illegal drugs, especially cocaine and methamphetamine.  Taking birth control pills, especially if you also use tobacco. Factors that you cannot change  Being older than age 35.  History of blood clots, stroke, or mini-stroke (transient ischemic attack, TIA).  History of high blood pressure when pregnant (preeclampsia), in women.  Family history of  stroke.  Sickle cell disease.  Blood clotting disorders (hypercoagulable state). What are the signs or symptoms? Symptoms of this condition usually develop suddenly, or you may notice them after waking from sleep. These sudden symptoms may include:  Weakness or numbness of your face, arm, or leg, especially on one side of your body.  Loss of balance or coordination.  Slurred speech, or aphasia. Aphasia is trouble speaking or trouble understanding speech or both.  Vision changes in one or both eyes. You may have double vision, blurred vision, or loss of vision.  Dizziness or confusion.  Nausea and vomiting.  Severe headache with no known cause. If possible, write down the exact time that you last felt like your normal self and what time your symptoms started. Tell your health care provider. If symptoms come and go, they could be signs of a TIA (transient ischemic attack). Get help right away, even if you feel better. How is this diagnosed? This condition may be diagnosed based on:  Your symptoms, your medical history, and a physical exam.  CT scan of the brain.  MRI.  Imaging tests that scan blood flow (circulation) in the brain. These may be CT angiogram, MRI angiogram, or cerebral angiogram. You may need to see a health care provider who specializes in stroke care. A stroke specialist can be seen in person or through communication using telephone or television technology (telemedicine). You may also have other tests, including:  Electrocardiogram (ECG).  Continuous heart monitoring.  Transthoracic echocardiogram (TTE).  Transesophageal echocardiogram (TEE).  Carotid ultrasound.  Blood tests.  Sleep study to check for sleep  apnea. How is this treated? Treatment for this condition depends on the duration, severity, and cause of your symptoms and on the area of the brain affected. It is very important to get treatment at the first sign of stroke symptoms. Some  treatments work better if they are done within 3-6 hours of the start of stroke symptoms. These initial treatments may include:  Thrombolytic medicine that is injected to dissolve the blood clot.  Treatments given directly to the affected artery to remove or dissolve the blood clot.  Medicines to control blood pressure.  Anticoagulant or antiplatelet medicines to thin the blood. Other treatments may include:  Oxygen.  IV fluids.  Procedures to increase blood flow. Medicines and diet changes may be used to help manage risk factors for stroke, such as diabetes, high cholesterol, and high blood pressure. After a stroke, you may work with physical, speech, mental health, or occupational therapists to help you recover. Follow these instructions at home: Medicines  Take over-the-counter and prescription medicines only as told by your health care provider.  If you were told to take a medicine to thin your blood, take your medicine exactly as told, at the same time every day. This includes aspirin or an anticoagulant. ? Taking too much blood-thinning medicine can cause bleeding. ? If you do not take enough blood-thinning medicine, you will not be protected enough against another stroke and other problems.  Understand the side effects of taking anticoagulant medicine. When taking this type of medicine, make sure you: ? Hold pressure over any cuts for longer than usual. ? Tell your dentist and other health care providers that you are taking anticoagulants before you have any procedures that may cause bleeding. ? Avoid activities that could cause injury or bruising. ? Wear a medical alert bracelet or carry a card that lists what medicines you take. Eating and drinking  Follow instructions from your health care provider about diet.  Eat healthy foods.  If your stroke affected your ability to swallow, you may need to take steps to avoid choking. These may include taking small bites when  eating and eating foods that are soft or pureed. Safety  Follow instructions from your health care team about physical activity.  Use a walker or cane as told by your health care provider.  Take steps to create a safe home environment to lower your risk of falls. These steps may include: ? Having your home looked at by specialists. ? Installing grab bars in the bedroom and bathroom. ? Using safety equipment, such as raised toilets and a seat in the shower. General instructions  Do not use any products that contain nicotine or tobacco, such as cigarettes, e-cigarettes, and chewing tobacco. If you need help quitting, ask your health care provider.  If you drink alcohol: ? Limit how much you use to:  0-1 drink a day for women.  0-2 drinks a day for men. ? Be aware of how much alcohol is in your drink. In the [place] one drink equals one 12 oz bottle of beer (355 mL), one 5 oz glass of wine (148 mL), or one 1 oz glass of hard liquor (44 mL).  If you need help to stop using drugs or alcohol, ask your health care provider about a referral to a program or specialist.  Maintain an active and healthy lifestyle. Get regular exercise as told.  Wear a medical bracelet as told by your health care provider.  Keep all follow-up visits as told by  your health care provider, including visits with all specialists on your health care team. This is important. How is this prevented? You can lower your risk of another stroke by managing high blood pressure, high cholesterol, diabetes, heart disease, sleep apnea, and obesity. Your risk can also be lowered by quitting smoking, limiting alcohol, and staying physically active. Your health care provider will continue to help you with ways to prevent short-term and long-term problems caused by stroke. Get help right away if: You have any symptoms of a stroke. "BE FAST" is an easy way to remember the main warning signs of a stroke:  B - Balance. Signs are  dizziness, sudden trouble walking, or loss of balance.  E - Eyes. Signs are trouble seeing or a sudden change in vision.  F - Face. Signs are sudden weakness or numbness of the face, or the face or eyelid drooping on one side.  A - Arms. Signs are weakness or numbness in an arm. This happens suddenly and usually on one side of the body.  S - Speech. Signs are sudden trouble speaking, slurred speech, or trouble understanding what people say.  T - Time. Time to call emergency services. Write down what time symptoms started. You have other signs of a stroke, such as:  A sudden, severe headache with no known cause.  Nausea or vomiting.  Seizure. These symptoms may represent a serious problem that is an emergency. Do not wait to see if the symptoms will go away. Get medical help right away. Call your local emergency services (911 in the [place] Do not drive yourself to the hospital.   Summary  An ischemic stroke (cerebrovascular accident, or CVA) is the sudden death of brain tissue that occurs when an area of the brain does not get enough blood flow.  Symptoms of this condition usually develop suddenly, or you may notice them after waking from sleep.  It is very important to get treatment at the first sign of stroke symptoms. Stroke is a medical emergency that must be treated right away. This information is not intended to replace advice given to you by your health care provider. Make sure you discuss any questions you have with your health care provider. Document Revised: 09/17/2019 Document Reviewed: 09/17/2019 Elsevier Patient Education  2021 [employer].

## 2020-12-10 NOTE — Telephone Encounter (Signed)
Yes because that will be an appointment to evaluate her migraine management. She has also had an incidental stroke and that is why she is seeing dr Pearlean Brownie. thanks

## 2020-12-11 ENCOUNTER — Telehealth: Payer: Self-pay | Admitting: Neurology

## 2020-12-11 LAB — LIPID PANEL: Chol/HDL Ratio: 2.4 ratio (ref 0.0–4.4)

## 2020-12-11 NOTE — Telephone Encounter (Signed)
Patient's ALT is 722 and her AST is 288. She needs to see primary care asap. Can you verify her primary care physician so we can send that over?

## 2020-12-11 NOTE — Telephone Encounter (Signed)
[facility] pending for CTA [facility] I faxed clinical notes.  [facility]A [facility] auth: [id number] (exp. 12/11/20 to 01/25/21)

## 2020-12-11 NOTE — Telephone Encounter (Signed)
Results faxed to Lance Bosch NP.

## 2020-12-11 NOTE — Telephone Encounter (Signed)
I spoke with the patient and let her know that her liver enzymes are very elevated and she needs to see pcp right away. Pt aware only a few labs have come back and the rest are pending. I confirmed that she goes to [facility] and her provider there is Lance Bosch (already on chart). I advised her we would send the results to Sue Lush and the pt needs to call and schedule an appointment for soonest available.

## 2020-12-12 LAB — LIPID PANEL: Triglycerides: 38 mg/dL (ref 0–149)

## 2020-12-15 ENCOUNTER — Encounter: Payer: Self-pay | Admitting: Student

## 2020-12-15 ENCOUNTER — Other Ambulatory Visit: Payer: Self-pay

## 2020-12-15 ENCOUNTER — Ambulatory Visit: Payer: Medicaid Other | Admitting: Student

## 2020-12-15 VITALS — BP 122/67 | HR 82 | Temp 97.8°F | Ht <= 58 in | Wt 113.0 lb

## 2020-12-15 DIAGNOSIS — I10 Essential (primary) hypertension: Secondary | ICD-10-CM

## 2020-12-15 DIAGNOSIS — R002 Palpitations: Secondary | ICD-10-CM

## 2020-12-15 DIAGNOSIS — R0602 Shortness of breath: Secondary | ICD-10-CM

## 2020-12-15 NOTE — Progress Notes (Signed)
Primary Physician/Referring:  Lance Bosch, NP  Patient ID: Christine Beard, female    DOB: May 28, 1991, 30 y.o.   MRN: [id number]  Chief Complaint  Patient presents with  . Hypertension  . Palpitations  . Follow-up   HPI:    Christine Beard  is a 30 y.o. Caucasian female with history of preeclampsia and postpartum hypertension, as well as intermittent smoking for the last 10 years. She has history of tubal ligation. She was referred to our office by PCP for evaluation and management of hypertension.  Patient denies history of coronary artery disease, hyperlipidemia, diabetes, TIA/CVA, DVT, pulmonary embolism.  Patient does not have details regarding family history but does know there is family history of heart disease on both her mother and father sides.  She has a history of tobacco use, quit January 2022.  Patient has history of postpartum hypertension, requiring hospitalization and treatment with magnesium in the past. Notably she is presently in the process of being evaluated by neurology for paresthesias, dizziness, and recurrent headaches.  MRI of the brain showed concerning finding of 4 mm T2 hyperintense focus consistent with lacunar infarction, she is currently undergoing complete stroke with neurology, our office will continue to follow.  Patient presents for 6-week follow-up of hypertension and results of cardiac testing.  At last visit started patient on amlodipine 5 mg daily, which she is tolerating well.  Overall patient states she is presently feeling well, without specific complaints today.  Denies chest pain, palpitations, dyspnea, syncope, near syncope.  Denies fatigue.  Patient states symptoms of fatigue and palpitations have improved since last visit.  Patient continues to refrain from smoking, she is congratulated on this.  Recent labs by neurology revealed elevated liver enzymes, patient is currently scheduled for follow-up with her PCP regarding further evaluation  and management of this.  Past Medical History:  Diagnosis Date  . Anxiety   . Depression   . Headache   . Hypothyroidism   . Migraine   . S/P tubal ligation 10/06/2017  . SVD (spontaneous vaginal delivery) 10/05/2017  . Thyroid disease    Past Surgical History:  Procedure Laterality Date  . TOE SURGERY  04/2005  . TONSILLECTOMY  11/2004  . TUBAL LIGATION N/A 10/06/2017   Procedure: POST PARTUM TUBAL LIGATION;  Surgeon: Sherian Rein, MD;  Location: [facility];  Service: Gynecology;  Laterality: N/A;   Family History  Problem Relation Age of Onset  . High blood pressure Maternal Grandmother   . Cancer Maternal Grandmother        "they removed it so she's fine"  . Diabetes Paternal Grandmother   . Cancer Paternal Grandmother   . Heart attack Father   . Migraines Father   . Heart attack Paternal Grandfather     Social History   Tobacco Use  . Smoking status: Former Smoker    Packs/day: 1.50    Types: Cigarettes    Quit date: 10/03/2020    Years since quitting: 0.2  . Smokeless tobacco: Never Used  Substance Use Topics  . Alcohol use: Yes    Comment: "once in awhile"   Marital Status: Significant Other   ROS  Review of Systems  Constitutional: Negative for malaise/fatigue (improved) and weight gain.  Cardiovascular: Negative for chest pain, claudication, leg swelling, near-syncope, orthopnea, palpitations (resolved ), paroxysmal nocturnal dyspnea and syncope.  Respiratory: Negative for shortness of breath (resolved).   Hematologic/Lymphatic: Does not bruise/bleed easily.  Gastrointestinal: Negative for melena.  Neurological: Positive  for dizziness, headaches and paresthesias. Negative for weakness.    Objective  Blood pressure 122/67, pulse 82, temperature 97.8 F (36.6 C), height 4' 7.4" (1.407 m), weight 113 lb (51.3 kg), SpO2 100 %, unknown if currently breastfeeding.  Vitals with BMI 12/15/2020 11/25/2020 11/03/2020  Height 4' 7.4" 4' 7.5" 4\' 7"    Weight 113 lbs 114 lbs 115 lbs 6 oz  BMI 25.89 26.01 26.82  Systolic 122 [id number]  Diastolic 67 78 86  Pulse 82 79 88  Some encounter information is confidential and restricted. Go to Review Flowsheets activity to see all data.      Physical Exam Vitals reviewed.  HENT:     Head: Normocephalic and atraumatic.  Cardiovascular:     Rate and Rhythm: Normal rate and regular rhythm.     Pulses: Intact distal pulses.     Heart sounds: S1 normal and S2 normal. No murmur heard. No gallop.   Pulmonary:     Effort: Pulmonary effort is normal. No respiratory distress.     Breath sounds: No wheezing, rhonchi or rales.  Abdominal:     General: Abdomen is flat. Bowel sounds are normal.     Palpations: Abdomen is soft.  Musculoskeletal:     Right lower leg: No edema.     Left lower leg: No edema.  Skin:    General: Skin is warm and dry.     Capillary Refill: Capillary refill takes less than 2 seconds.  Neurological:     General: No focal deficit present.     Mental Status: She is alert and oriented to person, place, and time.  Psychiatric:        Mood and Affect: Mood normal.        Behavior: Behavior normal.     Laboratory examination:   Recent Labs    12/10/20 1120  NA 138  K 4.0  CL 104  CO2 17*  GLUCOSE 106*  BUN 8  CREATININE 0.87  CALCIUM 9.5   estimated creatinine clearance is 61.4 mL/min (by C-G formula based on SCr of 0.87 mg/dL).  CMP Latest Ref Rng & Units 12/10/2020 10/28/2018 10/30/2017  Glucose 65 - 99 mg/dL 454(U106(H) 90 88  BUN 6 - 20 mg/dL 8 12 7   Creatinine 0.57 - 1.00 mg/dL 9.810.87 1.910.57 4.780.60  Sodium 134 - 144 mmol/L 138 141 140  Potassium 3.5 - 5.2 mmol/L 4.0 3.9 3.6  Chloride 96 - 106 mmol/L 104 109 109  CO2 20 - 29 mmol/L 17(L) 27 23  Calcium 8.7 - 10.2 mg/dL 9.5 9.0 9.1  Total Protein 6.0 - 8.5 g/dL 7.4 6.1(L) 7.1  Total Bilirubin 0.0 - 1.2 mg/dL 0.7 0.5 0.5  Alkaline Phos 44 - 121 IU/L 321(H) 33(L) 68  AST 0 - 40 IU/L 288(H) 12(L) 17  ALT 0 - 32 IU/L  722(HH) 12 14   CBC Latest Ref Rng & Units 12/10/2020 10/28/2018 10/30/2017  WBC 3.4 - 10.8 x10E3/uL 9.8 5.7 6.6  Hemoglobin 11.1 - 15.9 g/dL 29.514.1 62.112.3 30.812.3  Hematocrit 34.0 - 46.6 % 42.2 38.4 38.7  Platelets 150 - 450 x10E3/uL 179 170 208    Lipid Panel Recent Labs    12/10/20 1120  CHOL 193  TRIG 38  LDLCALC 106*  HDL 79  CHOLHDL 2.4    HEMOGLOBIN A1C Lab Results  Component Value Date   HGBA1C 4.4 (L) 12/10/2020   MPG 82.45 10/28/2018   TSH No results for input(s): TSH in the last 8760 hours.  External labs:   08/04/2020: Hemoglobin 12.3, hematocrit 37.4, MCV 91,  RDW 11.3, CBC otherwise normal Glucose 88, BUN 9, creatinine 0.58, GFR 123, sodium 140, potassium 4.7, chloride 109, calcium 8.4, CMP otherwise normal. TSH 4.08 (normal) Vitamin D 44.2 (normal)  Medications and allergies   Allergies  Allergen Reactions  . Cinnamon Itching  . Codeine Nausea And Vomiting     Outpatient Medications Prior to Visit  Medication Sig Dispense Refill  . ALPRAZolam (XANAX) 0.25 MG tablet Take 1-2 tabs (0.25mg -0.50mg ) 30-60 minutes before procedure. May repeat if needed.Do not drive. 10 tablet 0  . amLODipine (NORVASC) 5 MG tablet Take 1 tablet (5 mg total) by mouth daily. 30 tablet 3  . ASPIRIN 81 PO Take 81 mg by mouth daily.    . Biotin 5000 MCG TABS Take 5,000 mcg by mouth daily.    . busPIRone (BUSPAR) 15 MG tablet Take 15 mg by mouth 3 (three) times daily.    Marland Kitchen CALCIUM PO Take 1,000 Int'l Units by mouth daily.    . Cetirizine HCl (ALLERGY RELIEF) 10 MG CAPS Take by mouth.    . Cholecalciferol (VITAMIN D3 PO) Take 1,000 Int'l Units by mouth daily.    . fluticasone (VERAMYST) 27.5 MCG/SPRAY nasal spray Place 2 sprays into the nose daily.    . Fluvoxamine Maleate 150 MG CP24 Take 300 mg by mouth at bedtime.    . metoCLOPramide (REGLAN) 10 MG tablet Take 1 tablet (10 mg total) by mouth every 6 (six) hours as needed. For migraine or nausea. 30 tablet 1  . Multiple  Vitamins-Minerals (ONE-A-DAY FOR HER VITACRAVES PO) Take by mouth.    . Omega-3 Fatty Acids (FISH OIL) 1000 MG CAPS Take by mouth daily.    . ondansetron (ZOFRAN-ODT) 4 MG disintegrating tablet Take 1 tablet by mouth as needed.    . topiramate (TOPAMAX) 50 MG tablet Take 1 tablet (50 mg total) by mouth at bedtime. 30 tablet 6  . ibuprofen (ADVIL) 200 MG tablet Take 800 mg by mouth in the morning, at noon, in the evening, and at bedtime.    Marland Kitchen acetaminophen (TYLENOL) 500 MG tablet Take 1,000 mg by mouth 5 (five) times daily.    Marland Kitchen aspirin EC 81 MG tablet Take 1 tablet (81 mg total) by mouth daily. Swallow whole. 30 tablet 11  . Omeprazole Magnesium (PRILOSEC) 10 MG PACK Take by mouth.    . Ubrogepant (UBRELVY) 100 MG TABS Take 100 mg by mouth every 2 (two) hours as needed. Maximum 200mg  a day. 16 tablet 11   No facility-administered medications prior to visit.     Radiology:   No results found.  Cardiac Studies:   ABI 11/20/2020:  This exam reveals mildly decreased perfusion of the right lower extremity, noted at the anterior tibial and post tibial artery level (ABI 0.89) and mildly decreased perfusion of the left lower extremity, noted at the anterior tibial and post tibial artery level (ABI 0.80). There is mildly abnormal biphasic waveform at the PT arteries bilaterally.   PCV ECHOCARDIOGRAM COMPLETE 11/20/2020 Normal LV systolic function with visual EF 55-60%. Left ventricle cavity is normal in size. Normal global wall motion. Normal diastolic filling pattern, normal LAP. Mild tricuspid regurgitation. No evidence of pulmonary hypertension. No prior study for comparison.   Long-term monitor 7 days 11/03/2020-11/10/2020: Predominant underlying rhythm was sinus with minimum heart rate of 57 bpm, maximum heart rate of 153 bpm, average heart rate of 85 bpm.  Symptoms correlated with sinus rates >  90 bpm, PVCs and PACs.  PACs and PVCs were rare, PAC couplets and triplets were rare.  No PVC  couplets or triplets.  No ventricular tachycardia, pauses >3 seconds, high degree AV block, atrial fibrillation, or supraventricular tachycardia greater than 4 beats.   EKG:   EKG 11/03/2020: Sinus rhythm at a rate of 81 bpm.  Normal axis.  No evidence of ischemia or injury pattern.  Assessment     ICD-10-CM   1. Hypertension, unspecified type  I10   2. Palpitations  R00.2   3. Shortness of breath  R06.02      Medications Discontinued During This Encounter  Medication Reason  . aspirin EC 81 MG tablet Duplicate  . ibuprofen (ADVIL) 200 MG tablet Completed Course  . Omeprazole Magnesium (PRILOSEC) 10 MG PACK Error  . Ubrogepant (UBRELVY) 100 MG TABS Completed Course    No orders of the defined types were placed in this encounter.   Recommendations:   Christine Beard is a 30 y.o. Caucasian female with history of preeclampsia and postpartum hypertension, as well as intermittent smoking for the last 10 years. She has history of tubal ligation. She was referred to our office by PCP for evaluation and management of hypertension.  Patient denies history of coronary artery disease, hyperlipidemia, diabetes, TIA/CVA, DVT, pulmonary embolism.  Patient does not have details regarding family history but does know there is family history of heart disease on both her mother and father sides.  She has a history of tobacco use, quit January 2022.  Patient has history of postpartum hypertension, requiring hospitalization and treatment with magnesium in the past. Notably she is presently in the process of being evaluated by neurology for paresthesias, dizziness, and recurrent headaches.  MRI of the brain showed concerning finding of 4 mm T2 hyperintense focus consistent with lacunar infarction, she is currently undergoing complete stroke with neurology, our office will continue to follow.  Patient presents for 6-week follow-up of hypertension and results of cardiac testing.  At last visit added  amlodipine 5 mg daily which she is tolerating without issue and blood pressure is not well controlled.  We will continue this.  Reviewed and discussed results of ABI, ambulatory cardiac telemetry, and echocardiogram with patient, details above.  ABI revealed mildly reduced perfusion of bilateral lower extremities, therefore encouraged actuated exercise and advised patient to continue with aspirin daily.  Ambulatory cardiac telemetry revealed symptomatic PVCs and PACs, however since starting amlodipine patient reports she has had no recurrence of palpitations.  Counseled her regarding the nature of PACs and PVCs, patient verbalized understanding, will continue amlodipine at this time.  Echocardiogram revealed LVEF of 55-60% and mild tricuspid regurgitation, otherwise normal.  Overall patient is doing well, and previously noted palpitations, fatigue, and shortness of breath have all improved.  We will continue to follow peripherally regarding neurology work-up of stroke.  Will not make changes to her medications at this time.  Again counseled patient regarding importance of continued tobacco cessation and congratulated on her efforts thus far.  Also advised her regarding diet and lifestyle modifications.  Patient is stable from a cardiovascular standpoint.  Follow-up in 1 year, sooner if needed, for hypertension and PACs/PVCs.   Rayford Halsted, PA-C 12/15/2020, 1:58 PM Office: [phone]

## 2020-12-15 NOTE — Telephone Encounter (Signed)
Check status it is pending.

## 2020-12-16 NOTE — Telephone Encounter (Signed)
CTA head [facility] [employer] community auth: [id number] (exp. 01/29/21) order's have been sent to GI. They will reach out to the patient to schedule.

## 2020-12-17 ENCOUNTER — Other Ambulatory Visit: Payer: Self-pay | Admitting: Neurology

## 2020-12-17 ENCOUNTER — Encounter: Payer: Self-pay | Admitting: *Deleted

## 2020-12-17 ENCOUNTER — Telehealth: Payer: Self-pay | Admitting: *Deleted

## 2020-12-17 DIAGNOSIS — R760 Raised antibody titer: Secondary | ICD-10-CM

## 2020-12-17 NOTE — Telephone Encounter (Signed)
I called the patient and we discussed the lab results.  Patient is aware that her LDL is elevated and Dr. Lucia Gaskins is recommended for patient to start a low-dose statin such as atorvastatin 10 mg daily.  Patient's questions were answered and she has agreed to start this medication.  I let her know I would send details of the medication to her MyChart and we discussed a few common side effects and asked her to call if she develops myalgias.  We also discussed the slightly elevated anticardiolipin antibody.  Patient understands that this will need to be rechecked in 12 weeks and we have scheduled her an appointment for labs on June 7th.  She verbalized appreciation for the call.    Anson Fret, MD  12/17/2020 4:27 PM EDT      Anticardiolipin was slightly elevated. Moderately or very high levels of anticardiolipin antibodies that persist for 12 weeks or more indicate a continued presence of these autoantibodies and an increased risk of abnormal clots. I would like her to be rested in 12 weeks, she can come to the office in 12 weeks for repeat lab testing thanks   Anson Fret, MD  12/13/2020 12:25 PM EST      Cholesterol is elevated at 106. I would recommend starting a low dose statin like lipitor 10. Please discuss with patient and let me know thanks. Awaiting more results. thanks

## 2020-12-18 LAB — LUPUS ANTICOAGULANT
Dilute Viper Venom Time: 31.8 s (ref 0.0–47.0)
PTT Lupus Anticoagulant: 32.2 s (ref 0.0–51.9)
Thrombin Time: 15.4 s (ref 0.0–23.0)
dPT Confirm Ratio: 0.98 Ratio (ref 0.00–1.34)
dPT: 34.5 s (ref 0.0–47.6)

## 2020-12-18 LAB — CBC
Hematocrit: 42.2 % (ref 34.0–46.6)
Hemoglobin: 14.1 g/dL (ref 11.1–15.9)
MCH: 29.6 pg (ref 26.6–33.0)
MCHC: 33.4 g/dL (ref 31.5–35.7)
MCV: 89 fL (ref 79–97)
Platelets: 179 10*3/uL (ref 150–450)
RBC: 4.76 x10E6/uL (ref 3.77–5.28)
RDW: 11.9 % (ref 11.7–15.4)
WBC: 9.8 10*3/uL (ref 3.4–10.8)

## 2020-12-18 LAB — FACTOR V LEIDEN

## 2020-12-18 LAB — HEMOGLOBIN A1C
Est. average glucose Bld gHb Est-mCnc: 80 mg/dL
Hgb A1c MFr Bld: 4.4 % — ABNORMAL LOW (ref 4.8–5.6)

## 2020-12-18 LAB — HOMOCYSTEINE: Homocysteine: 3.5 umol/L (ref 0.0–14.5)

## 2020-12-18 LAB — ANTITHROMBIN III: AntiThromb III Func: 123 % (ref 75–135)

## 2020-12-18 LAB — PROTHROMBIN GENE MUTATION

## 2020-12-18 LAB — BETA-2-GLYCOPROTEIN I ABS, IGG/M/A
Beta-2 Glyco 1 IgA: <9 GPI IgA units (ref 0–25)
Beta-2 Glyco 1 IgM: <9 GPI IgM units (ref 0–32)
Beta-2 Glyco I IgG: <9 GPI IgG units (ref 0–20)

## 2020-12-18 LAB — LIPID PANEL
Cholesterol, Total: 193 mg/dL (ref 100–199)
HDL: 79 mg/dL (ref >39)
LDL Chol Calc (NIH): 106 mg/dL — ABNORMAL HIGH (ref 0–99)
VLDL Cholesterol Cal: 8 mg/dL (ref 5–40)

## 2020-12-18 LAB — COMPREHENSIVE METABOLIC PANEL
ALT: 722 IU/L (ref 0–32)
AST: 288 IU/L — ABNORMAL HIGH (ref 0–40)
Albumin/Globulin Ratio: 2 (ref 1.2–2.2)
Albumin: 4.9 g/dL — ABNORMAL HIGH (ref 3.8–4.8)
Alkaline Phosphatase: 321 IU/L — ABNORMAL HIGH (ref 44–121)
BUN/Creatinine Ratio: 9 (ref 9–23)
BUN: 8 mg/dL (ref 6–20)
Bilirubin Total: 0.7 mg/dL (ref 0.0–1.2)
CO2: 17 mmol/L — ABNORMAL LOW (ref 20–29)
Calcium: 9.5 mg/dL (ref 8.7–10.2)
Chloride: 104 mmol/L (ref 96–106)
Creatinine, Ser: 0.87 mg/dL (ref 0.57–1.00)
Globulin, Total: 2.5 g/dL (ref 1.5–4.5)
Glucose: 106 mg/dL — ABNORMAL HIGH (ref 65–99)
Potassium: 4 mmol/L (ref 3.5–5.2)
Sodium: 138 mmol/L (ref 134–144)
Total Protein: 7.4 g/dL (ref 6.0–8.5)
eGFR: 91 mL/min/{1.73_m2} (ref >59)

## 2020-12-18 LAB — PROTEIN C, TOTAL: Protein C Antigen: 115 % (ref 60–150)

## 2020-12-18 LAB — PROTEIN C ACTIVITY: Protein C Activity: 134 % (ref 73–180)

## 2020-12-18 LAB — PROTEIN S ACTIVITY: Protein S Activity: 76 % (ref 63–140)

## 2020-12-18 LAB — CARDIOLIPIN ANTIBODIES, IGM+IGG
Anticardiolipin IgG: <9 GPL U/mL (ref 0–14)
Anticardiolipin IgM: 27 MPL U/mL — ABNORMAL HIGH (ref 0–12)

## 2020-12-18 LAB — PROTEIN S, TOTAL: Protein S Ag, Total: 69 % (ref 60–150)

## 2020-12-18 NOTE — Telephone Encounter (Signed)
Will hold off until liver enzymes are addressed.

## 2020-12-18 NOTE — Telephone Encounter (Signed)
Spoke with pt and advised her of Dr Trevor Mace message, hold off on the cholesterol medication until liver enzymes are addressed. Pt verbalized understanding. She stated she saw PCP and had some lab work so the process has started. I let her know we would readdress after and requested she keep [place] posted and also anticipate PCP will send [place] the results.

## 2020-12-23 NOTE — Progress Notes (Signed)
External labs 12/16/2020: Acute hepatitis panel negative Hemoglobin 13.7, hematocrit 40.4, MCV 89, plt 165 Glucose 88, BUN 13, creatinine 0.72, GFR 115, sodium 139, potassium 4.4, alk phos 149, AST 18, ALT 79 B12 627 INR is 0.9 TSH 2.71 Magnesium 2.1

## 2020-12-26 ENCOUNTER — Ambulatory Visit
Admission: RE | Admit: 2020-12-26 | Discharge: 2020-12-26 | Disposition: A | Discharge status: Home / Self Care | Payer: Medicaid Other | Source: Ambulatory Visit | Attending: Neurology | Admitting: Neurology

## 2020-12-26 ENCOUNTER — Other Ambulatory Visit: Payer: Self-pay

## 2020-12-26 DIAGNOSIS — I639 Cerebral infarction, unspecified: Secondary | ICD-10-CM

## 2020-12-26 MED ORDER — IOPAMIDOL (ISOVUE-370) INJECTION 76%
75.0000 mL | Freq: Once | INTRAVENOUS | Status: AC | PRN
  Administered 2020-12-26: 75 mL via INTRAVENOUS

## 2020-12-29 ENCOUNTER — Other Ambulatory Visit: Payer: Self-pay | Admitting: Student

## 2020-12-29 DIAGNOSIS — R002 Palpitations: Secondary | ICD-10-CM

## 2020-12-29 DIAGNOSIS — I10 Essential (primary) hypertension: Secondary | ICD-10-CM

## 2021-01-14 ENCOUNTER — Telehealth: Payer: Self-pay | Admitting: Neurology

## 2021-01-14 NOTE — Telephone Encounter (Signed)
Called [employer] NPR Cartiod Can Be sch . Insurance Card # is [id number] [employer] [id number] . Left Patient a message to [employer] card so New Card could be scanned . Arma Heading   [employer] had Update information on the portal information Updated Arma Heading 01/13/2021  Having trouble Verfying PT's In insurance I have called Plan checked Portal . and called [employer] comm. I have left PT a message . Arma Heading

## 2021-01-23 ENCOUNTER — Other Ambulatory Visit: Payer: Self-pay

## 2021-01-23 ENCOUNTER — Ambulatory Visit (HOSPITAL_COMMUNITY)
Admission: RE | Admit: 2021-01-23 | Discharge: 2021-01-23 | Disposition: A | Discharge status: Home / Self Care | Payer: Medicaid Other | Source: Ambulatory Visit | Attending: Neurology | Admitting: Neurology

## 2021-01-23 DIAGNOSIS — I639 Cerebral infarction, unspecified: Secondary | ICD-10-CM | POA: Diagnosis not present

## 2021-01-23 NOTE — Progress Notes (Signed)
TCD w/ bubble study completed.   Please see CV Proc for preliminary results.   Tramain Gershman, RDMS, RVT  

## 2021-03-10 ENCOUNTER — Other Ambulatory Visit: Payer: Self-pay

## 2021-03-10 ENCOUNTER — Other Ambulatory Visit (INDEPENDENT_AMBULATORY_CARE_PROVIDER_SITE_OTHER): Payer: Self-pay

## 2021-03-10 ENCOUNTER — Other Ambulatory Visit: Payer: Self-pay | Admitting: Neurology

## 2021-03-10 DIAGNOSIS — R002 Palpitations: Secondary | ICD-10-CM

## 2021-03-10 DIAGNOSIS — R760 Raised antibody titer: Secondary | ICD-10-CM

## 2021-03-10 DIAGNOSIS — I10 Essential (primary) hypertension: Secondary | ICD-10-CM

## 2021-03-10 DIAGNOSIS — Z0289 Encounter for other administrative examinations: Secondary | ICD-10-CM

## 2021-03-10 MED ORDER — AMLODIPINE BESYLATE 5 MG PO TABS
5.0000 mg | ORAL_TABLET | Freq: Every day | ORAL | 0 refills | Status: DC
Start: 2021-03-10 — End: 2021-10-26

## 2021-03-11 LAB — CARDIOLIPIN ANTIBODIES, IGG, IGM, IGA
Anticardiolipin IgA: <9 APL U/mL (ref 0–11)
Anticardiolipin IgG: <9 GPL U/mL (ref 0–14)
Anticardiolipin IgM: 20 MPL U/mL — ABNORMAL HIGH (ref 0–12)

## 2021-03-11 MED ORDER — METOCLOPRAMIDE HCL 10 MG PO TABS: 10.0000 mg | ORAL_TABLET | Freq: Four times a day (QID) | ORAL | 1 refills | Status: DC | PRN

## 2021-03-18 ENCOUNTER — Telehealth: Payer: Self-pay | Admitting: Neurology

## 2021-03-18 NOTE — Telephone Encounter (Signed)
Can you please verify if appointment with Megan on 03/25/21 is still needed since she is seeing Dr. Pearlean Brownie on 03/31/21? Thanks!

## 2021-03-18 NOTE — Telephone Encounter (Signed)
I LVM for patient letting her know that per Dr. Lucia Gaskins her appt with [facility] on 6/22 is not needed so I went ahead and cancelled it and she will still be seeing Dr. Pearlean Brownie on 6/28.

## 2021-03-25 ENCOUNTER — Ambulatory Visit: Payer: Medicaid Other | Admitting: Adult Health

## 2021-03-27 ENCOUNTER — Ambulatory Visit: Payer: Self-pay | Admitting: General Surgery

## 2021-03-31 ENCOUNTER — Ambulatory Visit: Payer: Medicaid Other | Admitting: Neurology

## 2021-03-31 ENCOUNTER — Encounter: Payer: Self-pay | Admitting: Neurology

## 2021-03-31 VITALS — BP 115/64 | HR 88 | Ht <= 58 in | Wt 114.0 lb

## 2021-03-31 DIAGNOSIS — I639 Cerebral infarction, unspecified: Secondary | ICD-10-CM | POA: Diagnosis not present

## 2021-03-31 DIAGNOSIS — I6381 Other cerebral infarction due to occlusion or stenosis of small artery: Secondary | ICD-10-CM

## 2021-03-31 MED ORDER — ATORVASTATIN CALCIUM 20 MG PO TABS: 20.0000 mg | ORAL_TABLET | Freq: Every day | ORAL | 11 refills | Status: DC

## 2021-03-31 MED ORDER — ASPIRIN 81 MG PO TBEC: 81.0000 mg | DELAYED_RELEASE_TABLET | Freq: Every day | ORAL | 0 refills | Status: DC

## 2021-03-31 NOTE — Progress Notes (Signed)
[facility] [address] [place]. [place] [address] [phone]       OFFICE CONSULT NOTE  Ms. Christine Beard Date of Birth:  29-Jan-1991 Medical Record Number:  [id number]   Referring MD:  Dr Lucia Gaskins  Reason for Referral:  Stroke  HPI: Ms. Christine Beard is a 30 year old pleasant Caucasian lady seen today for initial office consultation visit for stroke.  History is obtained from the patient and review of electronic medical records and I have reviewed pertinent available imaging films in PACS.  She has past medical history of migraines, hypothyroidism, anxiety depression and thyroid disease.  Patient states that 3 years ago about 9 days after the delivery of her second child she got worse shock when her older child was falling down the steps and she yelled and noticed all of a sudden that she had right facial droop, slurred speech and right upper extremity numbness and weakness.  This lasted for an hour or 2.  She went to the emergency room but got tired of waiting and left and did not get medical work-up at that time.  She denied any recurrent stroke or TIA symptoms.  She was seen by Dr. Daisy Blossom on 11/25/2020 for chronic daily headaches which were felt to be migraines with medication overuse.  She was started on Topamax which seems to have helped and headaches are much less frequent now.  She underwent MRI scan of the brain which was done on 12/05/2020 which shows a tiny left thalamic lacunar infarct of remote age.  CT angiogram of the brain and neck done on 12/26/2020 unremarkable for any large vessel stenosis or occlusion.  Lab work on 12/10/2020 showed LDL cholesterol 206 mg percent and hemoglobin A1c 4.4.  Hypercoagulable panel labs were negative except for elevated IgM anticardiolipin antibody titer of 27.  Repeat lab work for the same on 03/10/2021 showed level had come down to 20.  She had transcranial Doppler bubble study on 01/23/2021 which was negative for right-to-left shunt.  2D echo  on 11/20/2020 showed normal ejection fraction of 55 to 60%..  14-day long-term heart monitor study on 11/18/2020 showed sinus rhythm with PVCs and PACs but no atrial fibrillation.  Patient denies any history of DVT, pulmonary embolism, recurrent miscarriages.  There is no family history of strokes or TIA in a young age.  There is no family history of strokes later on in life either.  Patient had elevated liver enzymes on 12/10/2020 with alkaline phosphatase 321, AST 288 and ALT 722.  She had abdominal ultrasound on 02/12/2021 which showed multiple gallbladder stones and she is scheduled to undergo gallbladder surgery on 04/16/2021.  She has not been started on statin yet but he is on fish oil. ROS:   14 system review of systems is positive for headaches only all other systems negative  PMH:  Past Medical History:  Diagnosis Date   Anxiety    Depression    Headache    Hypothyroidism    Migraine    S/P tubal ligation 10/06/2017   Stroke (HCC)    SVD (spontaneous vaginal delivery) 10/05/2017   Thyroid disease     Social History:  Social History   Socioeconomic History   Marital status: Significant Other    Spouse name: Not on file   Number of children: 2   Years of education: Not on file   Highest education level: GED or equivalent  Occupational History   Not on file  Tobacco Use   Smoking status: Former  Packs/day: 1.50    Pack years: 0.00    Types: Cigarettes    Quit date: 10/03/2020    Years since quitting: 0.4   Smokeless tobacco: Never  Vaping Use   Vaping Use: Never used  Substance and Sexual Activity   Alcohol use: Yes    Comment: "once in awhile"   Drug use: Never   Sexual activity: Yes    Birth control/protection: Surgical    Comment: tubal ligation  Other Topics Concern   Not on file  Social History Narrative   Lives at home with fiance and 2 children   Right handed   Caffeine: "a lot of pepsi" but also tries to drink a lot of water as well   Social  Determinants of Corporate investment banker Strain: Not on file  Food Insecurity: Not on file  Transportation Needs: Not on file  Physical Activity: Not on file  Stress: Not on file  Social Connections: Not on file  Intimate Partner Violence: Not on file    Medications:   Current Outpatient Medications on File Prior to Visit  Medication Sig Dispense Refill   ALPRAZolam (XANAX) 0.25 MG tablet Take 1-2 tabs (0.25mg -0.50mg ) 30-60 minutes before procedure. May repeat if needed.Do not drive. 10 tablet 0   amLODipine (NORVASC) 5 MG tablet Take 1 tablet (5 mg total) by mouth daily. 90 tablet 0   Biotin 5000 MCG TABS Take 5,000 mcg by mouth daily.     busPIRone (BUSPAR) 15 MG tablet Take 15 mg by mouth 3 (three) times daily.     CALCIUM PO Take 1,000 Int'l Units by mouth daily.     Cetirizine HCl (ALLERGY RELIEF) 10 MG CAPS Take by mouth.     Cholecalciferol (VITAMIN D3 PO) Take 1,000 Int'l Units by mouth daily.     fluticasone (VERAMYST) 27.5 MCG/SPRAY nasal spray Place 2 sprays into the nose daily.     Fluvoxamine Maleate 150 MG CP24 Take 300 mg by mouth at bedtime.     metoCLOPramide (REGLAN) 10 MG tablet Take 1 tablet (10 mg total) by mouth every 6 (six) hours as needed. For migraine or nausea. 30 tablet 1   Multiple Vitamins-Minerals (ONE-A-DAY FOR HER VITACRAVES PO) Take by mouth.     Omega-3 Fatty Acids (FISH OIL) 1000 MG CAPS Take by mouth daily.     ondansetron (ZOFRAN-ODT) 4 MG disintegrating tablet Take 1 tablet by mouth as needed.     topiramate (TOPAMAX) 50 MG tablet Take 1 tablet (50 mg total) by mouth at bedtime. 30 tablet 6   acetaminophen (TYLENOL) 500 MG tablet Take 1,000 mg by mouth 5 (five) times daily.     No current facility-administered medications on file prior to visit.    Allergies:   Allergies  Allergen Reactions   Cinnamon Itching   Codeine Nausea And Vomiting    Physical Exam General: well developed, well nourished, seated, in no evident distress Head:  head normocephalic and atraumatic.   Neck: supple with no carotid or supraclavicular bruits Cardiovascular: regular rate and rhythm, no murmurs Musculoskeletal: no deformity Skin:  no rash/petichiae Vascular:  Normal pulses all extremities  Neurologic Exam Mental Status: Awake and fully alert. Oriented to place and time. Recent and remote memory intact. Attention span, concentration and fund of knowledge appropriate. Mood and affect appropriate.  Cranial Nerves: Fundoscopic exam reveals sharp disc margins. Pupils equal, briskly reactive to light. Extraocular movements full without nystagmus. Visual fields full to confrontation. Hearing intact. Facial sensation intact.  Face, tongue, palate moves normally and symmetrically.  Motor: Normal bulk and tone. Normal strength in all tested extremity muscles. Sensory.: intact to touch , pinprick , position and vibratory sensation.  Coordination: Rapid alternating movements normal in all extremities. Finger-to-nose and heel-to-shin performed accurately bilaterally. Gait and Station: Arises from chair without difficulty. Stance is normal. Gait demonstrates normal stride length and balance . Able to heel, toe and tandem walk without difficulty.  Reflexes: 1+ and symmetric. Toes downgoing.   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  She does not have significant vascular risk factors except mild hyperlipidemia and smoking..  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 in the absence of other clinical symptomatology of antiphospholipid antibody syndrome History of chronic daily headaches which returned from migraine headaches which appear to have improved on Topamax    PLAN: I had a long d/w patient about her remote  thalamic stroke, risk for recurrent stroke/TIAs, personally independently reviewed imaging studies and stroke evaluation results and answered questions.Continue aspirin 81 mg daily  for secondary stroke prevention and maintain strict control of hypertension with blood pressure goal below 130/90, diabetes with hemoglobin A1c goal below 6.5% and lipids with LDL cholesterol goal below 70 mg/dL. I also advised the patient to eat a healthy diet with plenty of whole grains, cereals, fruits and vegetables, exercise regularly and maintain ideal body weight .  I have encouraged her to to quit smoking completely.  I recommend she check TEE to look for PFO and repeat liver function test today.  If liver function tests are okay start Lipitor 20 mg daily for her mild hyperlipidemia.  She has slightly elevated antiphospholipid antibody IgM but no clinical symptomatology to support antiphospholipid antibodies hence I recommend repeat level in 3 months.  Continue follow-up with Dr. Lucia Gaskins for her migraines.  No scheduled routine follow-up with me is necessary.  Greater than 50% time during this 45-minute consultation visit was spent on counseling and coordination of care about cryptogenic stroke and discussion about stroke prevention and treatment and answering questions. Delia Heady, MD  Note: This document was prepared with digital dictation and possible smart phrase technology. Any transcriptional errors that result from this process are unintentional.

## 2021-03-31 NOTE — Patient Instructions (Signed)
I had a long d/w patient about her remote thalamic stroke, risk for recurrent stroke/TIAs, personally independently reviewed imaging studies and stroke evaluation results and answered questions.Continue aspirin 81 mg daily  for secondary stroke prevention and maintain strict control of hypertension with blood pressure goal below 130/90, diabetes with hemoglobin A1c goal below 6.5% and lipids with LDL cholesterol goal below 70 mg/dL. I also advised the patient to eat a healthy diet with plenty of whole grains, cereals, fruits and vegetables, exercise regularly and maintain ideal body weight .  I recommend she check TEE to look for PFO and repeat liver function test today.  If liver function tests are okay start Lipitor 20 mg daily for her mild hyperlipidemia.  She has slightly elevated antiphospholipid antibody IgM but no clinical symptomatology to support antiphospholipid antibodies hence I recommend repeat level in 3 months.  Continue follow-up with Dr. Lucia Gaskins for her migraines.  No scheduled routine follow-up with me is necessary. Stroke Prevention Some medical conditions and behaviors are associated with a higher chance of having a stroke. You can help prevent a stroke by making nutrition, lifestyle,and other changes, including managing any medical conditions you may have. What nutrition changes can be made?  Eat healthy foods. You can do this by: Choosing foods high in fiber, such as fresh fruits and vegetables and whole grains. Eating at least 5 or more servings of fruits and vegetables a day. Try to fill half of your plate at each meal with fruits and vegetables. Choosing lean protein foods, such as lean cuts of meat, poultry without skin, fish, tofu, beans, and nuts. Eating low-fat dairy products. Avoiding foods that are high in salt (sodium). This can help lower blood pressure. Avoiding foods that have saturated fat, trans fat, and cholesterol. This can help prevent high cholesterol. Avoiding  processed and premade foods. Follow your health care provider's specific guidelines for losing weight, controlling high blood pressure (hypertension), lowering high cholesterol, and managing diabetes. These may include: Reducing your daily calorie intake. Limiting your daily sodium intake to 1,500 milligrams (mg). Using only healthy fats for cooking, such as olive oil, canola oil, or sunflower oil. Counting your daily carbohydrate intake. What lifestyle changes can be made? Maintain a healthy weight. Talk to your health care provider about your ideal weight. Get at least 30 minutes of moderate physical activity at least 5 days a week. Moderate activity includes brisk walking, biking, and swimming. Do not use any products that contain nicotine or tobacco, such as cigarettes and e-cigarettes. If you need help quitting, ask your health care provider. It may also be helpful to avoid exposure to secondhand smoke. Limit alcohol intake to no more than 1 drink a day for nonpregnant women and 2 drinks a day for men. One drink equals 12 oz of beer, 5 oz of wine, or 1 oz of hard liquor. Stop any illegal drug use. Avoid taking birth control pills. Talk to your health care provider about the risks of taking birth control pills if: You are over 35 years old. You smoke. You get migraines. You have ever had a blood clot. What other changes can be made? Manage your cholesterol levels. Eating a healthy diet is important for preventing high cholesterol. If cholesterol cannot be managed through diet alone, you may also need to take medicines. Take any prescribed medicines to control your cholesterol as told by your health care provider. Manage your diabetes. Eating a healthy diet and exercising regularly are important parts of managing your  blood sugar. If your blood sugar cannot be managed through diet and exercise, you may need to take medicines. Take any prescribed medicines to control your diabetes as told  by your health care provider. Control your hypertension. To reduce your risk of stroke, try to keep your blood pressure below 130/80. Eating a healthy diet and exercising regularly are an important part of controlling your blood pressure. If your blood pressure cannot be managed through diet and exercise, you may need to take medicines. Take any prescribed medicines to control hypertension as told by your health care provider. Ask your health care provider if you should monitor your blood pressure at home. Have your blood pressure checked every year, even if your blood pressure is normal. Blood pressure increases with age and some medical conditions. Get evaluated for sleep disorders (sleep apnea). Talk to your health care provider about getting a sleep evaluation if you snore a lot or have excessive sleepiness. Take over-the-counter and prescription medicines only as told by your health care provider. Aspirin or blood thinners (antiplatelets or anticoagulants) may be recommended to reduce your risk of forming blood clots that can lead to stroke. Make sure that any other medical conditions you have, such as atrial fibrillation or atherosclerosis, are managed. What are the warning signs of a stroke? The warning signs of a stroke can be easily remembered as BEFAST. B is for balance. Signs include: Dizziness. Loss of balance or coordination. Sudden trouble walking. E is for eyes. Signs include: A sudden change in vision. Trouble seeing. F is for face. Signs include: Sudden weakness or numbness of the face. The face or eyelid drooping to one side. A is for arms. Signs include: Sudden weakness or numbness of the arm, usually on one side of the body. S is for speech. Signs include: Trouble speaking (aphasia). Trouble understanding. T is for time. These symptoms may represent a serious problem that is an emergency. Do not wait to see if the symptoms will go away. Get medical help right away.  Call your local emergency services (911 in the [place] Do not drive yourself to the hospital. Other signs of stroke may include: A sudden, severe headache with no known cause. Nausea or vomiting. Seizure. Where to find more information For more information, visit: American Stroke Association: www.strokeassociation.org National Stroke Association: www.stroke.org Summary You can prevent a stroke by eating healthy, exercising, not smoking, limiting alcohol intake, and managing any medical conditions you may have. Do not use any products that contain nicotine or tobacco, such as cigarettes and e-cigarettes. If you need help quitting, ask your health care provider. It may also be helpful to avoid exposure to secondhand smoke. Remember BEFAST for warning signs of stroke. Get help right away if you or a loved one has any of these signs. This information is not intended to replace advice given to you by your health care provider. Make sure you discuss any questions you have with your healthcare provider. Document Revised: 09/02/2017 Document Reviewed: 10/26/2016 Elsevier Patient Education  2021 [employer].

## 2021-04-01 LAB — HEPATIC FUNCTION PANEL
ALT: 20 IU/L (ref 0–32)
AST: 17 IU/L (ref 0–40)
Albumin: 4.3 g/dL (ref 3.8–4.8)
Alkaline Phosphatase: 134 IU/L — ABNORMAL HIGH (ref 44–121)
Bilirubin Total: <0.2 mg/dL (ref 0.0–1.2)
Bilirubin, Direct: <0.1 mg/dL (ref 0.00–0.40)
Total Protein: 6.5 g/dL (ref 6.0–8.5)

## 2021-04-03 NOTE — Progress Notes (Signed)
Kindly inform the patient that lab work for liver function was mostly normal and significantly improved from 3 months ago.

## 2021-04-07 ENCOUNTER — Telehealth: Payer: Self-pay | Admitting: *Deleted

## 2021-04-07 NOTE — Telephone Encounter (Signed)
-----   Message from Micki Riley, MD sent at 04/03/2021  3:45 PM EDT ----- Joneen Roach inform the patient that lab work for liver function was mostly normal and significantly improved from 3 months ago.

## 2021-04-07 NOTE — Telephone Encounter (Signed)
Left patient a detailed message, with results, on voicemail (ok per DPR).  Provided our number to call back with any questions.  

## 2021-04-09 NOTE — Patient Instructions (Addendum)
DUE TO COVID-19 ONLY ONE VISITOR IS ALLOWED TO COME WITH YOU AND STAY IN THE WAITING ROOM ONLY DURING PRE OP AND PROCEDURE.   **NO VISITORS ARE ALLOWED IN THE SHORT STAY AREA OR RECOVERY ROOM!!**        Your procedure is scheduled on: Thursday, 04-16-21   Report to [facility] Main  Entrance    Report to admitting at 7:00 AM   Call this number if you have problems the morning of surgery [phone]   Do not eat food :After Midnight.   May have liquids until 6:00 AM  day of surgery  CLEAR LIQUID DIET  Foods Allowed                                                                     Foods Excluded  Water, Black Coffee and tea, regular and decaf               liquids that you cannot  Plain Jell-O in any flavor  (No red)                                     see through such as: Fruit ices (not with fruit pulp)                                      milk, soups, orange juice              Iced Popsicles (No red)                                      All solid food                                   Apple juices Sports drinks like Gatorade (No red) Lightly seasoned clear broth or consume(fat free) Sugar, honey syrup    Complete one Ensure drink the morning of surgery at  6:00 AM  the day of surgery.     The day of surgery:  Drink ONE (1) Pre-Surgery Clear Ensure by am the morning of surgery. Drink in one sitting. Do not sip.  This drink was given to you during your hospital  pre-op appointment visit. Nothing else to drink after completing the  Pre-Surgery Clear Ensure          If you have questions, please contact your surgeon's office.     Oral Hygiene is also important to reduce your risk of infection.                                    Remember - BRUSH YOUR TEETH THE MORNING OF SURGERY WITH YOUR REGULAR TOOTHPASTE   Do NOT smoke after Midnight   Take these medicines the morning of surgery with A SIP OF WATER:  Alprazolam, Amlodipine, Buspar, Gabapentin, Loratadine  You may not have any metal on your body including hair pins, jewelry, and body piercing             Do not wear make-up, lotions, powders, perfumes or deodorant  Do not wear nail polish including gel and S&S, artificial/acrylic nails, or any other type of covering on natural nails including finger and toenails. If you have artificial nails, gel coating, etc. that needs to be removed by a nail salon please have this removed prior to surgery or surgery may need to be canceled/ delayed if the surgeon/ anesthesia feels like they are unable to be safely monitored.   Do not shave  48 hours prior to surgery.         Do not bring valuables to the hospital. [facility] IS NOT RESPONSIBLE   FOR VALUABLES.   Contacts, dentures or bridgework may not be worn into surgery.   Patients discharged the day of surgery will not be allowed to drive home.  Please read over the following fact sheets you were given: IF YOU HAVE QUESTIONS ABOUT YOUR PRE OP INSTRUCTIONS PLEASE CALL [phone] [facility] - Preparing for Surgery Before surgery, you can play an important role.  Because skin is not sterile, your skin needs to be as free of germs as possible.  You can reduce the number of germs on your skin by washing with CHG (chlorahexidine gluconate) soap before surgery.  CHG is an antiseptic cleaner which kills germs and bonds with the skin to continue killing germs even after washing. Please DO NOT use if you have an allergy to CHG or antibacterial soaps.  If your skin becomes reddened/irritated stop using the CHG and inform your nurse when you arrive at Short Stay. Do not shave (including legs and underarms) for at least 48 hours prior to the first CHG shower.  You may shave your face/neck.  Please follow these instructions carefully:  1.  Shower with CHG Soap the night before surgery and the  morning of surgery.  2.  If you choose to wash your hair, wash your hair first as usual with your normal   shampoo.  3.  After you shampoo, rinse your hair and body thoroughly to remove the shampoo.                             4.  Use CHG as you would any other liquid soap.  You can apply chg directly to the skin and wash.  Gently with a scrungie or clean washcloth.  5.  Apply the CHG Soap to your body ONLY FROM THE NECK DOWN.   Do   not use on face/ open                           Wound or open sores. Avoid contact with eyes, ears mouth and   genitals (private parts).                       Wash face,  Genitals (private parts) with your normal soap.             6.  Wash thoroughly, paying special attention to the area where your    surgery  will be performed.  7.  Thoroughly rinse your body with warm water from the neck down.  8.  DO NOT shower/wash with your normal soap after  using and rinsing off the CHG Soap.                9.  Pat yourself dry with a clean towel.            10.  Wear clean pajamas.            11.  Place clean sheets on your bed the night of your first shower and do not  sleep with pets. Day of Surgery : Do not apply any lotions/deodorants the morning of surgery.  Please wear clean clothes to the hospital/surgery center.  FAILURE TO FOLLOW THESE INSTRUCTIONS MAY RESULT IN THE CANCELLATION OF YOUR SURGERY  PATIENT SIGNATURE_________________________________  NURSE SIGNATURE__________________________________  ________________________________________________________________________

## 2021-04-09 NOTE — Progress Notes (Addendum)
COVID Vaccine Completed: No Date COVID Vaccine completed: Has received booster: COVID vaccine manufacturer: Pfizer    [employer]   Date of COVID positive in last 74 days:N/A  PCP - Lance Bosch, NP Cardiologist - Elvin So, PA-C  Chest x-ray - N/A EKG - 11-03-20 Epic Stress Test - N/A ECHO - 11-20-20 Epic Cardiac Cath - N/A Pacemaker/ICD device last checked: Spinal Cord Stimulator: Long Term Monitor - 2022 Epic   Sleep Study - N/A CPAP -   Fasting Blood Sugar - N/A Checks Blood Sugar _____ times a day  Blood Thinner Instructions: Aspirin Instructions:  ASA 81 mg.  Patient was advised to continue Last Dose:  Activity level:  Can go up a flight of stairs and perform activities of daily living without stopping and without symptoms of chest pain or shortness of breath.  Able to exercise without symptoms   Anesthesia review:  Hx of SOB, palpitations, HTN, hx of cryptogenic stroke (no deficits per patient).  Patient states that she continues to have occasional palpitations but no chest pain or SOB.  Patient denies shortness of breath, fever, cough and chest pain at PAT appointment   Patient verbalized understanding of instructions that were given to them at the PAT appointment. Patient was also instructed that they will need to review over the PAT instructions again at home before surgery.

## 2021-04-10 ENCOUNTER — Other Ambulatory Visit: Payer: Self-pay

## 2021-04-10 ENCOUNTER — Encounter (HOSPITAL_COMMUNITY): Payer: Self-pay

## 2021-04-10 ENCOUNTER — Encounter (HOSPITAL_COMMUNITY)
Admission: RE | Admit: 2021-04-10 | Discharge: 2021-04-10 | Disposition: A | Discharge status: Home / Self Care | Payer: Medicaid Other | Source: Ambulatory Visit | Attending: General Surgery | Admitting: General Surgery

## 2021-04-10 DIAGNOSIS — Z01812 Encounter for preprocedural laboratory examination: Secondary | ICD-10-CM | POA: Insufficient documentation

## 2021-04-10 HISTORY — DX: Essential (primary) hypertension: I10

## 2021-04-10 HISTORY — DX: Gastro-esophageal reflux disease without esophagitis: K21.9

## 2021-04-10 LAB — CBC
HCT: 36.3 % (ref 36.0–46.0)
Hemoglobin: 11.2 g/dL — ABNORMAL LOW (ref 12.0–15.0)
MCH: 28.1 pg (ref 26.0–34.0)
MCHC: 30.9 g/dL (ref 30.0–36.0)
MCV: 91.2 fL (ref 80.0–100.0)
Platelets: 175 10*3/uL (ref 150–400)
RBC: 3.98 MIL/uL (ref 3.87–5.11)
RDW: 12.9 % (ref 11.5–15.5)
WBC: 5.3 10*3/uL (ref 4.0–10.5)
nRBC: 0 % (ref 0.0–0.2)

## 2021-04-10 LAB — COMPREHENSIVE METABOLIC PANEL
ALT: 22 U/L (ref 0–44)
AST: 20 U/L (ref 15–41)
Albumin: 4 g/dL (ref 3.5–5.0)
Alkaline Phosphatase: 67 U/L (ref 38–126)
Anion gap: 7 (ref 5–15)
BUN: 11 mg/dL (ref 6–20)
CO2: 22 mmol/L (ref 22–32)
Calcium: 9.3 mg/dL (ref 8.9–10.3)
Chloride: 113 mmol/L — ABNORMAL HIGH (ref 98–111)
Creatinine, Ser: 0.92 mg/dL (ref 0.44–1.00)
GFR, Estimated: >60 mL/min (ref >60)
Glucose, Bld: 89 mg/dL (ref 70–99)
Potassium: 3.7 mmol/L (ref 3.5–5.1)
Sodium: 142 mmol/L (ref 135–145)
Total Bilirubin: 0.5 mg/dL (ref 0.3–1.2)
Total Protein: 6.8 g/dL (ref 6.5–8.1)

## 2021-04-13 ENCOUNTER — Other Ambulatory Visit: Payer: Self-pay | Admitting: Neurology

## 2021-04-13 MED ORDER — TOPIRAMATE 100 MG PO TABS: 100.0000 mg | ORAL_TABLET | Freq: Every evening | ORAL | 6 refills | Status: DC

## 2021-04-15 NOTE — Anesthesia Preprocedure Evaluation (Addendum)
Anesthesia Evaluation  Patient identified by MRN, date of birth, ID band Patient awake    Reviewed: Allergy & Precautions, NPO status , Patient's Chart, lab work & pertinent test results  Airway Mallampati: II  TM Distance: >3 FB Neck ROM: Full    Dental no notable dental hx. (+) Teeth Intact, Dental Advisory Given   Pulmonary former smoker,    Pulmonary exam normal breath sounds clear to auscultation       Cardiovascular hypertension, Pt. on medications Normal cardiovascular exam Rhythm:Regular Rate:Normal     Neuro/Psych  Headaches, CVA    GI/Hepatic Neg liver ROS, GERD  Medicated,  Endo/Other  Hypothyroidism   Renal/GU Lab Results      Component                Value               Date                      WBC                      5.3                 04/10/2021                HGB                      11.2 (L)            04/10/2021                HCT                      36.3                04/10/2021                MCV                      91.2                04/10/2021                PLT                      175                 04/10/2021                Musculoskeletal negative musculoskeletal ROS (+)   Abdominal   Peds  Hematology Lab Results      Component                Value               Date                      WBC                      5.3                 04/10/2021                HGB                      11.2 (L)  04/10/2021                HCT                      36.3                04/10/2021                MCV                      91.2                04/10/2021                PLT                      175                 04/10/2021              Anesthesia Other Findings   Reproductive/Obstetrics                            Anesthesia Physical Anesthesia Plan  ASA: 3  Anesthesia Plan: General   Post-op Pain Management:    Induction: Intravenous  PONV Risk  Score and Plan: 4 or greater and Ondansetron, Treatment may vary due to age or medical condition, Midazolam and Dexamethasone  Airway Management Planned: Oral ETT  Additional Equipment: None  Intra-op Plan:   Post-operative Plan: Extubation in OR  Informed Consent: I have reviewed the patients History and Physical, chart, labs and discussed the procedure including the risks, benefits and alternatives for the proposed anesthesia with the patient or authorized representative who has indicated his/her understanding and acceptance.     Dental advisory given  Plan Discussed with: CRNA and Anesthesiologist  Anesthesia Plan Comments:        Anesthesia Quick Evaluation

## 2021-04-16 ENCOUNTER — Encounter (HOSPITAL_COMMUNITY): Payer: Self-pay | Admitting: General Surgery

## 2021-04-16 ENCOUNTER — Ambulatory Visit (HOSPITAL_COMMUNITY): Payer: Medicaid Other | Admitting: Anesthesiology

## 2021-04-16 ENCOUNTER — Encounter (HOSPITAL_COMMUNITY)
Admission: RE | Disposition: A | Discharge status: Home / Self Care | Payer: Self-pay | Source: Home / Self Care | Attending: General Surgery

## 2021-04-16 ENCOUNTER — Ambulatory Visit (HOSPITAL_COMMUNITY): Payer: Medicaid Other | Admitting: Physician Assistant

## 2021-04-16 ENCOUNTER — Ambulatory Visit (HOSPITAL_COMMUNITY)
Admission: RE | Admit: 2021-04-16 | Discharge: 2021-04-16 | Disposition: A | Discharge status: Home / Self Care | Payer: Medicaid Other | Attending: General Surgery | Admitting: General Surgery

## 2021-04-16 DIAGNOSIS — Z7982 Long term (current) use of aspirin: Secondary | ICD-10-CM | POA: Insufficient documentation

## 2021-04-16 DIAGNOSIS — Z885 Allergy status to narcotic agent status: Secondary | ICD-10-CM | POA: Insufficient documentation

## 2021-04-16 DIAGNOSIS — K219 Gastro-esophageal reflux disease without esophagitis: Secondary | ICD-10-CM | POA: Insufficient documentation

## 2021-04-16 DIAGNOSIS — Z79899 Other long term (current) drug therapy: Secondary | ICD-10-CM | POA: Diagnosis not present

## 2021-04-16 DIAGNOSIS — Z87891 Personal history of nicotine dependence: Secondary | ICD-10-CM | POA: Diagnosis not present

## 2021-04-16 DIAGNOSIS — E039 Hypothyroidism, unspecified: Secondary | ICD-10-CM | POA: Diagnosis not present

## 2021-04-16 DIAGNOSIS — Z8249 Family history of ischemic heart disease and other diseases of the circulatory system: Secondary | ICD-10-CM | POA: Diagnosis not present

## 2021-04-16 DIAGNOSIS — K8010 Calculus of gallbladder with chronic cholecystitis without obstruction: Secondary | ICD-10-CM | POA: Diagnosis not present

## 2021-04-16 DIAGNOSIS — Z833 Family history of diabetes mellitus: Secondary | ICD-10-CM | POA: Insufficient documentation

## 2021-04-16 HISTORY — PX: CHOLECYSTECTOMY: SHX55

## 2021-04-16 LAB — PREGNANCY, URINE: Preg Test, Ur: NEGATIVE

## 2021-04-16 SURGERY — LAPAROSCOPIC CHOLECYSTECTOMY
Anesthesia: General

## 2021-04-16 MED ORDER — BUPIVACAINE-EPINEPHRINE 0.25% -1:200000 IJ SOLN
INTRAMUSCULAR | Status: DC | PRN
  Administered 2021-04-16: 30 mL

## 2021-04-16 MED ORDER — KETOROLAC TROMETHAMINE 30 MG/ML IJ SOLN
INTRAMUSCULAR | Status: AC
  Filled 2021-04-16: qty 1

## 2021-04-16 MED ORDER — FENTANYL CITRATE (PF) 250 MCG/5ML IJ SOLN
INTRAMUSCULAR | Status: DC | PRN
  Administered 2021-04-16: 100 ug via INTRAVENOUS
  Administered 2021-04-16: 50 ug via INTRAVENOUS
  Administered 2021-04-16 (×2): 25 ug via INTRAVENOUS

## 2021-04-16 MED ORDER — PHENYLEPHRINE 40 MCG/ML (10ML) SYRINGE FOR IV PUSH (FOR BLOOD PRESSURE SUPPORT)
PREFILLED_SYRINGE | INTRAVENOUS | Status: DC | PRN
  Administered 2021-04-16: 80 ug via INTRAVENOUS

## 2021-04-16 MED ORDER — SODIUM CHLORIDE 0.9 % IV SOLN
2.0000 g | INTRAVENOUS | Status: AC
  Administered 2021-04-16: 2 g via INTRAVENOUS
  Filled 2021-04-16: qty 2

## 2021-04-16 MED ORDER — AMISULPRIDE (ANTIEMETIC) 5 MG/2ML IV SOLN: 10.0000 mg | Freq: Once | INTRAVENOUS | Status: DC | PRN

## 2021-04-16 MED ORDER — ONDANSETRON HCL 4 MG/2ML IJ SOLN
INTRAMUSCULAR | Status: DC | PRN
  Administered 2021-04-16: 4 mg via INTRAVENOUS

## 2021-04-16 MED ORDER — HYDROMORPHONE HCL 1 MG/ML IJ SOLN
INTRAMUSCULAR | Status: AC
  Filled 2021-04-16: qty 1

## 2021-04-16 MED ORDER — ROCURONIUM BROMIDE 10 MG/ML (PF) SYRINGE
PREFILLED_SYRINGE | INTRAVENOUS | Status: AC
  Filled 2021-04-16: qty 10

## 2021-04-16 MED ORDER — KETOROLAC TROMETHAMINE 30 MG/ML IJ SOLN
30.0000 mg | Freq: Once | INTRAMUSCULAR | Status: AC | PRN
  Administered 2021-04-16: 30 mg via INTRAVENOUS

## 2021-04-16 MED ORDER — LIDOCAINE 2% (20 MG/ML) 5 ML SYRINGE
INTRAMUSCULAR | Status: AC
  Filled 2021-04-16: qty 5

## 2021-04-16 MED ORDER — CELECOXIB 200 MG PO CAPS
ORAL_CAPSULE | ORAL | Status: AC
  Filled 2021-04-16: qty 1

## 2021-04-16 MED ORDER — DEXAMETHASONE SODIUM PHOSPHATE 10 MG/ML IJ SOLN
INTRAMUSCULAR | Status: AC
  Filled 2021-04-16: qty 1

## 2021-04-16 MED ORDER — LIDOCAINE 2% (20 MG/ML) 5 ML SYRINGE
INTRAMUSCULAR | Status: DC | PRN
  Administered 2021-04-16: 100 mg via INTRAVENOUS

## 2021-04-16 MED ORDER — ROCURONIUM BROMIDE 10 MG/ML (PF) SYRINGE
PREFILLED_SYRINGE | INTRAVENOUS | Status: DC | PRN
  Administered 2021-04-16: 60 mg via INTRAVENOUS

## 2021-04-16 MED ORDER — FENTANYL CITRATE (PF) 250 MCG/5ML IJ SOLN
INTRAMUSCULAR | Status: AC
  Filled 2021-04-16: qty 5

## 2021-04-16 MED ORDER — BUPIVACAINE-EPINEPHRINE (PF) 0.25% -1:200000 IJ SOLN
INTRAMUSCULAR | Status: AC
  Filled 2021-04-16: qty 30

## 2021-04-16 MED ORDER — CHLORHEXIDINE GLUCONATE CLOTH 2 % EX PADS: 6.0000 | MEDICATED_PAD | Freq: Once | CUTANEOUS | Status: DC

## 2021-04-16 MED ORDER — MIDAZOLAM HCL 2 MG/2ML IJ SOLN
INTRAMUSCULAR | Status: AC
  Filled 2021-04-16: qty 2

## 2021-04-16 MED ORDER — HYDROMORPHONE HCL 1 MG/ML IJ SOLN
0.2500 mg | INTRAMUSCULAR | Status: DC | PRN
  Administered 2021-04-16: 0.25 mg via INTRAVENOUS
  Administered 2021-04-16: 0.5 mg via INTRAVENOUS

## 2021-04-16 MED ORDER — CELECOXIB 200 MG PO CAPS
400.0000 mg | ORAL_CAPSULE | ORAL | Status: AC
  Administered 2021-04-16: 400 mg via ORAL
  Filled 2021-04-16: qty 2

## 2021-04-16 MED ORDER — ONDANSETRON HCL 4 MG/2ML IJ SOLN
INTRAMUSCULAR | Status: AC
  Filled 2021-04-16: qty 2

## 2021-04-16 MED ORDER — SUGAMMADEX SODIUM 200 MG/2ML IV SOLN
INTRAVENOUS | Status: DC | PRN
  Administered 2021-04-16: 200 mg via INTRAVENOUS

## 2021-04-16 MED ORDER — OXYCODONE HCL 5 MG/5ML PO SOLN: 5.0000 mg | Freq: Once | ORAL | Status: DC | PRN

## 2021-04-16 MED ORDER — LACTATED RINGERS IR SOLN
Status: DC | PRN
  Administered 2021-04-16: 1000 mL

## 2021-04-16 MED ORDER — OXYCODONE HCL 5 MG PO TABS: 5.0000 mg | ORAL_TABLET | Freq: Once | ORAL | Status: DC | PRN

## 2021-04-16 MED ORDER — ENSURE PRE-SURGERY PO LIQD
296.0000 mL | Freq: Once | ORAL | Status: DC
  Filled 2021-04-16: qty 296

## 2021-04-16 MED ORDER — LACTATED RINGERS IV SOLN: INTRAVENOUS | Status: DC

## 2021-04-16 MED ORDER — ORAL CARE MOUTH RINSE: 15.0000 mL | Freq: Once | OROMUCOSAL | Status: AC

## 2021-04-16 MED ORDER — DEXAMETHASONE SODIUM PHOSPHATE 10 MG/ML IJ SOLN
INTRAMUSCULAR | Status: DC | PRN
  Administered 2021-04-16: 5 mg via INTRAVENOUS

## 2021-04-16 MED ORDER — ALPRAZOLAM 0.25 MG PO TABS: 0.2500 mg | ORAL_TABLET | ORAL | Status: DC

## 2021-04-16 MED ORDER — ACETAMINOPHEN 500 MG PO TABS
1000.0000 mg | ORAL_TABLET | ORAL | Status: DC
  Filled 2021-04-16: qty 2

## 2021-04-16 MED ORDER — PHENYLEPHRINE 40 MCG/ML (10ML) SYRINGE FOR IV PUSH (FOR BLOOD PRESSURE SUPPORT)
PREFILLED_SYRINGE | INTRAVENOUS | Status: AC
  Filled 2021-04-16: qty 10

## 2021-04-16 MED ORDER — ONDANSETRON HCL 4 MG/2ML IJ SOLN: 4.0000 mg | Freq: Once | INTRAMUSCULAR | Status: DC | PRN

## 2021-04-16 MED ORDER — METOCLOPRAMIDE HCL 10 MG PO TABS: 10.0000 mg | ORAL_TABLET | Freq: Four times a day (QID) | ORAL | Status: DC | PRN

## 2021-04-16 MED ORDER — OXYCODONE HCL 5 MG PO TABS
5.0000 mg | ORAL_TABLET | Freq: Four times a day (QID) | ORAL | 0 refills | Status: DC | PRN
Start: 2021-04-16 — End: 2022-03-24

## 2021-04-16 MED ORDER — MIDAZOLAM HCL 2 MG/2ML IJ SOLN
INTRAMUSCULAR | Status: DC | PRN
  Administered 2021-04-16 (×2): 1 mg via INTRAVENOUS

## 2021-04-16 MED ORDER — CHLORHEXIDINE GLUCONATE 0.12 % MT SOLN
15.0000 mL | Freq: Once | OROMUCOSAL | Status: AC
  Administered 2021-04-16: 15 mL via OROMUCOSAL

## 2021-04-16 MED ORDER — PROPOFOL 10 MG/ML IV BOLUS
INTRAVENOUS | Status: AC
  Filled 2021-04-16: qty 20

## 2021-04-16 MED ORDER — PROPOFOL 10 MG/ML IV BOLUS
INTRAVENOUS | Status: DC | PRN
  Administered 2021-04-16: 170 mg via INTRAVENOUS

## 2021-04-16 SURGICAL SUPPLY — 49 items
APL PRP STRL LF DISP 70% ISPRP (MISCELLANEOUS) ×1
APL SKNCLS STERI-STRIP NONHPOA (GAUZE/BANDAGES/DRESSINGS)
APPLIER CLIP 5 13 M/L LIGAMAX5 (MISCELLANEOUS)
APPLIER CLIP ROT 10 11.4 M/L (STAPLE)
APR CLP MED LRG 11.4X10 (STAPLE)
APR CLP MED LRG 5 ANG JAW (MISCELLANEOUS)
BAG COUNTER SPONGE SURGICOUNT (BAG) IMPLANT
BAG SPNG CNTER NS LX DISP (BAG)
BENZOIN TINCTURE PRP APPL 2/3 (GAUZE/BANDAGES/DRESSINGS) IMPLANT
BNDG ADH 1X3 SHEER STRL LF (GAUZE/BANDAGES/DRESSINGS) IMPLANT
BNDG ADH THN 3X1 STRL LF (GAUZE/BANDAGES/DRESSINGS)
CABLE HIGH FREQUENCY MONO STRZ (ELECTRODE) ×2 IMPLANT
CHLORAPREP W/TINT 26 (MISCELLANEOUS) ×2 IMPLANT
CLIP APPLIE 5 13 M/L LIGAMAX5 (MISCELLANEOUS) IMPLANT
CLIP APPLIE ROT 10 11.4 M/L (STAPLE) IMPLANT
CLIP VESOLOCK LG 6/CT PURPLE (CLIP) IMPLANT
CLIP VESOLOCK MED LG 6/CT (CLIP) ×2 IMPLANT
COVER MAYO STAND STRL (DRAPES) ×2 IMPLANT
COVER SURGICAL LIGHT HANDLE (MISCELLANEOUS) ×2 IMPLANT
DECANTER SPIKE VIAL GLASS SM (MISCELLANEOUS) ×2 IMPLANT
DERMABOND ADVANCED (GAUZE/BANDAGES/DRESSINGS) ×1
DERMABOND ADVANCED .7 DNX12 (GAUZE/BANDAGES/DRESSINGS) ×1 IMPLANT
DRAIN CHANNEL 19F RND (DRAIN) IMPLANT
DRAPE C-ARM 42X120 X-RAY (DRAPES) IMPLANT
EVACUATOR SILICONE 100CC (DRAIN) IMPLANT
GLOVE SURG POLYISO LF SZ7 (GLOVE) ×2 IMPLANT
GLOVE SURG UNDER POLY LF SZ7 (GLOVE) ×2 IMPLANT
GOWN STRL REUS W/TWL LRG LVL3 (GOWN DISPOSABLE) ×2 IMPLANT
GOWN STRL REUS W/TWL XL LVL3 (GOWN DISPOSABLE) ×4 IMPLANT
GRASPER SUT TROCAR 14GX15 (MISCELLANEOUS) IMPLANT
KIT BASIN OR (CUSTOM PROCEDURE TRAY) ×2 IMPLANT
KIT TURNOVER KIT A (KITS) ×2 IMPLANT
POUCH RETRIEVAL ECOSAC 10 (ENDOMECHANICALS) ×1 IMPLANT
POUCH RETRIEVAL ECOSAC 10MM (ENDOMECHANICALS) ×2
SCISSORS LAP 5X35 DISP (ENDOMECHANICALS) ×2 IMPLANT
SET CHOLANGIOGRAPH MIX (MISCELLANEOUS) IMPLANT
SET IRRIG TUBING LAPAROSCOPIC (IRRIGATION / IRRIGATOR) ×2 IMPLANT
SET TUBE SMOKE EVAC HIGH FLOW (TUBING) ×2 IMPLANT
SLEEVE XCEL OPT CAN 5 100 (ENDOMECHANICALS) ×4 IMPLANT
STOPCOCK 4 WAY LG BORE MALE ST (IV SETS) IMPLANT
STRIP CLOSURE SKIN 1/2X4 (GAUZE/BANDAGES/DRESSINGS) ×2 IMPLANT
SUT ETHILON 2 0 PS N (SUTURE) IMPLANT
SUT MNCRL AB 4-0 PS2 18 (SUTURE) ×2 IMPLANT
SUT VICRYL 0 ENDOLOOP (SUTURE) IMPLANT
TOWEL OR 17X26 10 PK STRL BLUE (TOWEL DISPOSABLE) ×2 IMPLANT
TOWEL OR NON WOVEN STRL DISP B (DISPOSABLE) IMPLANT
TRAY LAPAROSCOPIC (CUSTOM PROCEDURE TRAY) ×2 IMPLANT
TROCAR BLADELESS OPT 5 100 (ENDOMECHANICALS) ×2 IMPLANT
TROCAR XCEL NON-BLD 11X100MML (ENDOMECHANICALS) ×2 IMPLANT

## 2021-04-16 NOTE — Anesthesia Postprocedure Evaluation (Signed)
Anesthesia Post Note  Patient: KEYERRA LAMERE  Procedure(s) Performed: LAPAROSCOPIC CHOLECYSTECTOMY     Patient location during evaluation: PACU Anesthesia Type: General Level of consciousness: awake and alert Pain management: pain level controlled Vital Signs Assessment: post-procedure vital signs reviewed and stable Respiratory status: spontaneous breathing, nonlabored ventilation, respiratory function stable and patient connected to nasal cannula oxygen Cardiovascular status: blood pressure returned to baseline and stable Postop Assessment: no apparent nausea or vomiting Anesthetic complications: no   No notable events documented.  Last Vitals:  Vitals:   04/16/21 1045 04/16/21 1048  BP: 107/66 97/79  Pulse: 97 69  Resp: 16 16  Temp: 36.7 C 36.9 C  SpO2: 97% 100%    Last Pain:  Vitals:   04/16/21 1048  TempSrc:   PainSc: 0-No pain                 Trevor Iha

## 2021-04-16 NOTE — Discharge Instructions (Signed)
[facility] ______[facility] LAPAROSCOPIC SURGERY: POST OP INSTRUCTIONS Always review your discharge instruction sheet given to you by the facility where your surgery was performed. IF YOU HAVE DISABILITY OR FAMILY LEAVE FORMS, YOU MUST BRING THEM TO THE OFFICE FOR PROCESSING.   DO NOT GIVE THEM TO YOUR DOCTOR.  A prescription for pain medication may be given to you upon discharge.  Take your pain medication as prescribed, if needed.  If narcotic pain medicine is not needed, then you may take acetaminophen (Tylenol) or ibuprofen (Advil) as needed. Take your usually prescribed medications unless otherwise directed. If you need a refill on your pain medication, please contact your pharmacy.  They will contact our office to request authorization. Prescriptions will not be filled after 5pm or on week-ends. You should follow a light diet the first few days after arrival home, such as soup and crackers, etc.  Be sure to include lots of fluids daily. Most patients will experience some swelling and bruising in the area of the incisions.  Ice packs will help.  Swelling and bruising can take several days to resolve.  It is common to experience some constipation if taking pain medication after surgery.  Increasing fluid intake and taking a stool softener (such as Colace) will usually help or prevent this problem from occurring.  A mild laxative (Milk of Magnesia or Miralax) should be taken according to package instructions if there are no bowel movements after 48 hours. Unless discharge instructions indicate otherwise, you may remove your bandages 24-48 hours after surgery, and you may shower at that time.  You may have steri-strips (small skin tapes) in place directly over the incision.  These strips should be left on the skin for 7-10 days.  If your surgeon used skin glue on the incision, you may shower in 24 hours.  The glue will flake off over the next 2-3 weeks.  Any sutures or staples will be  removed at the office during your follow-up visit. ACTIVITIES:  You may resume regular (light) daily activities beginning the next day--such as daily self-care, walking, climbing stairs--gradually increasing activities as tolerated.  You may have sexual intercourse when it is comfortable.  Refrain from any heavy lifting or straining until approved by your doctor. You may drive when you are no longer taking prescription pain medication, you can comfortably wear a seatbelt, and you can safely maneuver your car and apply brakes. RETURN TO WORK:  __________________________________________________________ You should see your doctor in the office for a follow-up appointment approximately 2-3 weeks after your surgery.  Make sure that you call for this appointment within a day or two after you arrive home to insure a convenient appointment time. OTHER INSTRUCTIONS: __________________________________________________________________________________________________________________________ __________________________________________________________________________________________________________________________ WHEN TO CALL YOUR DOCTOR: Fever over 101.0 Inability to urinate Continued bleeding from incision. Increased pain, redness, or drainage from the incision. Increasing abdominal pain  The clinic staff is available to answer your questions during regular business hours.  Please don't hesitate to call and ask to speak to one of the nurses for clinical concerns.  If you have a medical emergency, go to the nearest emergency room or call 911.  A surgeon from [facility] is always on call at the hospital. [address], [address], [place], [place]  [address] ? [address] [address], [place], [place]   [address] [phone] ? [phone] ? FAX [phone] Web site: [email or web address]  

## 2021-04-16 NOTE — H&P (Signed)
Christine Beard is an 30 y.o. female.   Chief Complaint: abdominal pain HPI: 30 yo female with upper abdominal pain, nausea and vomiting. She underwent an ultrasound showing mutliple gallstones.  Past Medical History:  Diagnosis Date   Anxiety    Depression    GERD (gastroesophageal reflux disease)    Headache    Hypertension    Hypothyroidism    Migraine    S/P tubal ligation 10/06/2017   Stroke (HCC)    SVD (spontaneous vaginal delivery) 10/05/2017   Thyroid disease     Past Surgical History:  Procedure Laterality Date   TOE SURGERY  04/2005   TONSILLECTOMY  11/2004   TUBAL LIGATION N/A 10/06/2017   Procedure: POST PARTUM TUBAL LIGATION;  Surgeon: Sherian Rein, MD;  Location: [facility];  Service: Gynecology;  Laterality: N/A;    Family History  Problem Relation Age of Onset   High blood pressure Maternal Grandmother    Cancer Maternal Grandmother        "they removed it so she's fine"   Diabetes Paternal Grandmother    Cancer Paternal Grandmother    Heart attack Father    Migraines Father    Heart attack Paternal Grandfather    Social History:  reports that she quit smoking about 6 months ago. Her smoking use included cigarettes. She smoked an average of 1.5 packs per day. She has never used smokeless tobacco. She reports previous alcohol use. She reports that she does not use drugs.  Allergies:  Allergies  Allergen Reactions   Cinnamon Itching   Codeine Nausea And Vomiting    Medications Prior to Admission  Medication Sig Dispense Refill   amLODipine (NORVASC) 5 MG tablet Take 1 tablet (5 mg total) by mouth daily. 90 tablet 0   aspirin (ASPIRIN 81) 81 MG EC tablet Take 1 tablet (81 mg total) by mouth daily. 1 tablet 0   busPIRone (BUSPAR) 15 MG tablet Take 15 mg by mouth 2 (two) times daily.     calcium carbonate (OSCAL) 1500 (600 Ca) MG TABS tablet Take 1,200 mg of elemental calcium by mouth daily.     cholecalciferol (VITAMIN D3) 25 MCG  (1000 UNIT) tablet Take 1,000 Units by mouth daily.     Fluvoxamine Maleate 150 MG CP24 Take 300 mg by mouth at bedtime.     gabapentin (NEURONTIN) 100 MG capsule Take 100 mg by mouth 2 (two) times daily.     loratadine (CLARITIN) 10 MG tablet Take 10 mg by mouth daily.     metoCLOPramide (REGLAN) 10 MG tablet Take 1 tablet (10 mg total) by mouth every 6 (six) hours as needed. For migraine or nausea. (Patient taking differently: Take 10 mg by mouth every 6 (six) hours as needed (For migraine or nausea.).) 30 tablet 1   Multiple Vitamins-Minerals (ONE-A-DAY FOR HER VITACRAVES PO) Take 1 tablet by mouth daily.     Omega-3 Fatty Acids (FISH OIL) 1000 MG CAPS Take 1,000 mg by mouth daily.     ondansetron (ZOFRAN) 8 MG tablet Take 8 mg by mouth every 8 (eight) hours as needed for nausea/vomiting.     topiramate (TOPAMAX) 100 MG tablet Take 1 tablet (100 mg total) by mouth at bedtime. 90 tablet 6   ALPRAZolam (XANAX) 0.25 MG tablet Take 1-2 tabs (0.25mg -0.50mg ) 30-60 minutes before procedure. May repeat if needed.Do not drive. (Patient taking differently: Take 0.25-0.5 mg by mouth See admin instructions. Take 1-2 tabs (0.25mg -0.50mg ) 30-60 minutes before procedure. May repeat if  needed.Do not drive.) 10 tablet 0   atorvastatin (LIPITOR) 20 MG tablet Take 1 tablet (20 mg total) by mouth daily. 30 tablet 11   Biotin 5 MG CAPS Take 5 mg by mouth daily.     fluticasone (VERAMYST) 27.5 MCG/SPRAY nasal spray Place 2 sprays into the nose daily.     SRONYX 0.1-20 MG-MCG tablet Take 1 tablet by mouth daily.      Results for orders placed or performed during the hospital encounter of 04/16/21 (from the past 48 hour(s))  Pregnancy, urine per protocol     Status: None   Collection Time: 04/16/21  7:12 AM  Result Value Ref Range   Preg Test, Ur NEGATIVE NEGATIVE    Comment:        THE SENSITIVITY OF THIS METHODOLOGY IS >20 mIU/mL. Performed at [facility], [address], [place],  [place] [address]    No results found.  Review of Systems  Constitutional:  Negative for chills and fever.  HENT:  Negative for hearing loss.   Respiratory:  Negative for cough.   Cardiovascular:  Negative for chest pain and palpitations.  Gastrointestinal:  Positive for abdominal pain and nausea. Negative for vomiting.  Genitourinary:  Negative for dysuria and urgency.  Musculoskeletal:  Negative for myalgias and neck pain.  Skin:  Negative for rash.  Neurological:  Positive for headaches. Negative for dizziness.  Hematological:  Does not bruise/bleed easily.  Psychiatric/Behavioral:  Negative for suicidal ideas.    Blood pressure 114/65, pulse 77, temperature 97.7 F (36.5 C), temperature source Oral, resp. rate 16, height 4\' 7"  (1.397 m), weight 50.8 kg, last menstrual period 03/23/2021, SpO2 100 %, unknown if currently breastfeeding. Physical Exam Vitals reviewed.  Constitutional:      Appearance: She is well-developed.  HENT:     Head: Normocephalic and atraumatic.  Eyes:     Conjunctiva/sclera: Conjunctivae normal.     Pupils: Pupils are equal, round, and reactive to light.  Cardiovascular:     Rate and Rhythm: Normal rate and regular rhythm.  Pulmonary:     Effort: Pulmonary effort is normal.     Breath sounds: Normal breath sounds.  Abdominal:     General: Bowel sounds are normal. There is no distension.     Palpations: Abdomen is soft.     Tenderness: There is no abdominal tenderness.  Musculoskeletal:        General: Normal range of motion.     Cervical back: Normal range of motion and neck supple.  Skin:    General: Skin is warm and dry.  Neurological:     Mental Status: She is alert and oriented to person, place, and time.  Psychiatric:        Behavior: Behavior normal.     Assessment/Plan 30 yo female with gallstones -lap chole -ERAS protocol -planned outpatient procedure  38, MD 04/16/2021, 7:33 AM

## 2021-04-16 NOTE — Anesthesia Procedure Notes (Signed)
Procedure Name: Intubation Date/Time: 04/16/2021 9:07 AM Performed by: Florene Route, CRNA Pre-anesthesia Checklist: Patient identified, Emergency Drugs available, Suction available and Patient being monitored Patient Re-evaluated:Patient Re-evaluated prior to induction Oxygen Delivery Method: Circle system utilized Preoxygenation: Pre-oxygenation with 100% oxygen Induction Type: IV induction Ventilation: Mask ventilation without difficulty Laryngoscope Size: Miller and 2 Grade View: Grade I Tube type: Oral Tube size: 7.0 mm Number of attempts: 1 Airway Equipment and Method: Stylet and Oral airway Placement Confirmation: ETT inserted through vocal cords under direct vision, positive ETCO2 and breath sounds checked- equal and bilateral Secured at: 21 cm Tube secured with: Tape Dental Injury: Teeth and Oropharynx as per pre-operative assessment

## 2021-04-16 NOTE — Transfer of Care (Signed)
Immediate Anesthesia Transfer of Care Note  Patient: Christine Beard  Procedure(s) Performed: LAPAROSCOPIC CHOLECYSTECTOMY  Patient Location: PACU  Anesthesia Type:General  Level of Consciousness: drowsy  Airway & Oxygen Therapy: Patient Spontanous Breathing and Patient connected to face mask oxygen  Post-op Assessment: Report given to RN and Post -op Vital signs reviewed and stable  Post vital signs: Reviewed and stable  Last Vitals:  Vitals Value Taken Time  BP 119/77 04/16/21 1015  Temp 36.9 C 04/16/21 1013  Pulse 101 04/16/21 1016  Resp 17 04/16/21 1016  SpO2 100 % 04/16/21 1016  Vitals shown include unvalidated device data.  Last Pain:  Vitals:   04/16/21 1013  TempSrc:   PainSc: Asleep      Patients Stated Pain Goal: 2 (04/16/21 0734)  Complications: No notable events documented.

## 2021-04-16 NOTE — Op Note (Signed)
PATIENT:  Christine Beard  30 y.o. female  PRE-OPERATIVE DIAGNOSIS:  CHRONIC CALCULOUS CHOLECYSTITIS  POST-OPERATIVE DIAGNOSIS:  CHRONIC CALCULOUS CHOLECYSTITIS  PROCEDURE:  Procedure(s): LAPAROSCOPIC CHOLECYSTECTOMY   SURGEON:  Aleksis Jiggetts, De Blanch, MD   ASSISTANT: none  ANESTHESIA:   local and general  Indications for procedure: [name] is a 30 y.o. female with symptoms of Abdominal pain and Nausea and vomiting consistent with gallbladder disease, Confirmed by ultrasound.  Description of procedure: The patient was brought into the operative suite, placed supine. Anesthesia was administered with endotracheal tube. Patient was strapped in place and foot board was secured. All pressure points were offloaded by foam padding. The patient was prepped and draped in the usual sterile fashion.  A periumbilical incision was made and optical entry was used to enter the abdomen. 2 5 mm trocars were placed on in the right lateral space on in the right subcostal space. A 56mm trocar was placed in the subxiphoid space. Marcaine was infused to the subxiphoid space and lateral upper right abdomen in the transversus abdominis plane. Next the patient was placed in reverse trendelenberg. The gallbladder appearedfull of stones and dilated.   The gallbladder was retracted cephalad and lateral. The peritoneum was reflected off the infundibulum working lateral to medial. The cystic duct and cystic artery were identified and further dissection revealed a critical view. The cystic duct and cystic artery were doubly clipped and ligated.   The gallbladder was removed off the liver bed with cautery. The Gallbladder was placed in a specimen bag. The gallbladder fossa was irrigated and hemostasis was applied with cautery. The gallbladder was removed via the 18mm trocar. The fascial defect was closed with interrupted 0 vicryl suture via laparoscopic trans-fascial suture passer. Pneumoperitoneum was removed,  all trocar were removed. All incisions were closed with 4-0 monocryl subcuticular stitch. The patient woke from anesthesia and was brought to PACU in stable condition. All counts were correct  Findings: chronic cholecystitis with stones  Specimen: gallbladder  Blood loss: 10 ml  Local anesthesia: 30 ml Marcaine  Complications: none  PLAN OF CARE: Discharge to home after PACU  PATIENT DISPOSITION:  PACU - hemodynamically stable.  De Blanch [facility], [place]

## 2021-04-17 ENCOUNTER — Encounter (HOSPITAL_COMMUNITY): Payer: Self-pay | Admitting: General Surgery

## 2021-04-17 LAB — SURGICAL PATHOLOGY

## 2021-07-20 ENCOUNTER — Encounter: Payer: Self-pay | Admitting: Neurology

## 2021-07-20 ENCOUNTER — Telehealth: Payer: Self-pay | Admitting: Neurology

## 2021-07-20 ENCOUNTER — Ambulatory Visit: Payer: Medicaid Other | Admitting: Neurology

## 2021-07-20 NOTE — Telephone Encounter (Signed)
Patient did not show up for her follow up appointment today.  If she calls back she can be rescheduled with any NP however please let her know we have a long list of people who need an appointment and are waiting for an appointment.  So please if she schedules 1 please plan to show up.  Also if she no-shows again or cancels within a short period of time she may be dismissed from our practice per practice policy.  Although we are happy to see her via NP.

## 2021-10-15 ENCOUNTER — Encounter: Payer: Self-pay | Admitting: Neurology

## 2021-10-15 ENCOUNTER — Encounter: Payer: Self-pay | Admitting: *Deleted

## 2021-10-15 ENCOUNTER — Telehealth: Payer: Self-pay | Admitting: *Deleted

## 2021-10-15 NOTE — Telephone Encounter (Signed)
I called pt and she said her daughter has been diagnosed with noonan syndrome.  (31yr old).  We see for migraines.  She could onto make appt that I made for her on 12-10-2021, she will call back to reschedule.

## 2021-10-23 ENCOUNTER — Other Ambulatory Visit: Payer: Self-pay | Admitting: Student

## 2021-10-23 ENCOUNTER — Other Ambulatory Visit: Payer: Self-pay | Admitting: Neurology

## 2021-10-23 DIAGNOSIS — R002 Palpitations: Secondary | ICD-10-CM

## 2021-10-23 DIAGNOSIS — I10 Essential (primary) hypertension: Secondary | ICD-10-CM

## 2021-10-29 ENCOUNTER — Encounter: Payer: Self-pay | Admitting: *Deleted

## 2021-11-28 ENCOUNTER — Other Ambulatory Visit: Payer: Self-pay | Admitting: Neurology

## 2021-12-10 ENCOUNTER — Ambulatory Visit: Payer: Medicaid Other | Admitting: Neurology

## 2021-12-15 ENCOUNTER — Ambulatory Visit: Payer: Medicaid Other | Admitting: Student

## 2021-12-16 ENCOUNTER — Ambulatory Visit (INDEPENDENT_AMBULATORY_CARE_PROVIDER_SITE_OTHER): Payer: Medicaid Other | Admitting: Neurology

## 2021-12-16 ENCOUNTER — Telehealth: Payer: Self-pay | Admitting: *Deleted

## 2021-12-16 ENCOUNTER — Encounter: Payer: Self-pay | Admitting: Neurology

## 2021-12-16 VITALS — BP 131/70 | HR 85 | Ht <= 58 in | Wt 106.5 lb

## 2021-12-16 DIAGNOSIS — R519 Headache, unspecified: Secondary | ICD-10-CM | POA: Diagnosis not present

## 2021-12-16 DIAGNOSIS — I6381 Other cerebral infarction due to occlusion or stenosis of small artery: Secondary | ICD-10-CM

## 2021-12-16 DIAGNOSIS — G8929 Other chronic pain: Secondary | ICD-10-CM

## 2021-12-16 MED ORDER — EMGALITY 120 MG/ML ~~LOC~~ SOAJ: 120.0000 mg | SUBCUTANEOUS | 11 refills | Status: DC

## 2021-12-16 NOTE — Patient Instructions (Signed)
Emgality once a month ? ?Galcanezumab injection ?What is this medication? ?GALCANEZUMAB (gal ka NEZ ue mab) is used to prevent migraines and treat cluster headaches. ?This medicine may be used for other purposes; ask your health care provider or pharmacist if you have questions. ?COMMON BRAND NAME(S): Emgality ?What should I tell my care team before I take this medication? ?They need to know if you have any of these conditions: ?an unusual or allergic reaction to galcanezumab, other medicines, foods, dyes, or preservatives ?pregnant or trying to get pregnant ?breast-feeding ?How should I use this medication? ?This medicine is for injection under the skin. You will be taught how to prepare and give this medicine. Use exactly as directed. Take your medicine at regular intervals. Do not take your medicine more often than directed. ?It is important that you put your used needles and syringes in a special sharps container. Do not put them in a trash can. If you do not have a sharps container, call your pharmacist or healthcare provider to get one. ?Talk to your pediatrician regarding the use of this medicine in children. Special care may be needed. ?Overdosage: If you think you have taken too much of this medicine contact a poison control center or emergency room at once. ?NOTE: This medicine is only for you. Do not share this medicine with others. ?What if I miss a dose? ?If you miss a dose, take it as soon as you can. If it is almost time for your next dose, take only that dose. Do not take double or extra doses. ?What may interact with this medication? ?Interactions are not expected. ?This list may not describe all possible interactions. Give your health care provider a list of all the medicines, herbs, non-prescription drugs, or dietary supplements you use. Also tell them if you smoke, drink alcohol, or use illegal drugs. Some items may interact with your medicine. ?What should I watch for while using this  medication? ?Tell your doctor or healthcare professional if your symptoms do not start to get better or if they get worse. ?What side effects may I notice from receiving this medication? ?Side effects that you should report to your doctor or health care professional as soon as possible: ?allergic reactions like skin rash, itching or hives, swelling of the face, lips, or tongue ?Side effects that usually do not require medical attention (report these to your doctor or health care professional if they continue or are bothersome): ?pain, redness, or irritation at site where injected ?This list may not describe all possible side effects. Call your doctor for medical advice about side effects. You may report side effects to FDA at 1-800-FDA-1088. ?Where should I keep my medication? ?Keep out of the reach of children. ?You will be instructed on how to store this medicine. Throw away any unused medicine after the expiration date on the label. ?NOTE: This sheet is a summary. It may not cover all possible information. If you have questions about this medicine, talk to your doctor, pharmacist, or health care provider. ?? 2022 Elsevier/Gold Standard (2018-03-10 00:00:00) ? ?

## 2021-12-16 NOTE — Progress Notes (Signed)
?[facility] ? ? ? ?Provider:  Dr Jaynee Eagles ?Requesting Provider: Ferd Hibbs, NP ?Primary Care Provider:  Ferd Hibbs, NP ? ?CC:  migraines ? ?12/16/2021: Here for follow up of migraines. The Topiramate is not working. She had tubal ligation no chance of having children. Having daily migraines.  Discussed  the multiple options that she has, at this time she has failed multiple medications. She has daily headaches and > 15 migraine days a month, unilateral, pulsating pounding, nausea, lasting up to 24 hours, photophobia and phonophobia.  We discussed other oral traditional medications, we discussed the new class of CGRP such as Aimovig, Ajovy, Emgality, we also discussed atogepant and we decided on Emgality.  We discussed migraine management, lifestyle factors, preventative and acute management.  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 ? ?MRI brain 12/05/2020: personally reviewed and agree with findings ?IMPRESSION: This MRI of the brain with and without contrast shows the following: ?1.   4 mm T2 hyperintense focus in the left thalamus consistent with a small focus of gliosis or lacunar infarction.  Though more subtle on CT scan, it appears to be present on the 10/31/2018 images. ?2.   Several punctate T2/flair hyperintense foci in the subcortical white matter.  This is nonspecific and could be the sequela of migraine or chronic ischemic change. ?3.   Normal enhancement pattern.  No acute findings. ? ?CTA head(reviewed reports) unremarkable ? ?(Additional 20 minutes reviewing images) ? ?Patient  complains of symptoms per HPI as well as the following symptoms: migraine,strokes . Pertinent negatives and positives per HPI. All others negative ? ?HPI:  Christine Beard is a 31 y.o. female here as requested by Ferd Hibbs, NP for daily headaches for one year --- actually most of her life. PMHx thyroid disease, SVD, migraine, hypothyroidism, headache, depression, anxiety. She has had migraines for years. Worsened after her second child when she had a scare with her children and her child almost fell down the stairs and she had acute right-sided weakness and a headache in 2019. One day she had ringing in her right ear and ever since then she has daily headaches and migraines, very bad migraines, dizziness, she takes daily OTC medications. Migraines are usually on the right side, ppounding/pulsating/throbbing, photophobia/phonophobia, nausea all the time, she has vomited, the migraine and headache last all day long 24 hours and are daily and moderate to severe pain. Lots of stress at home too. She gets so dizzy she feels she is going to faint. She has vision changes, headaches are worse positionally esp in the morning.  No more children, had tubal ligation. No other focal neurologic deficits, associated symptoms, inciting events or modifiable factors. ? ?Reviewed notes, labs and imaging from outside physicians, which showed: ? ?I tried to review primary care notes, they are handwritten and difficult to accurately interpret but it appears as though head, eyes, ears, nose, throat were normal, pupils equally round reactive to light, cardiovascularly normal, respiratory lungs clear to auscultation, neuro remote and recent memory intact alert and oriented x4, paresthesias, she has headaches and migraines, stressors versus psych follow-up as indicated, referral to neurology as well as [facility],  she was prescribed meclizine for vertigo and dizziness ? ?Echo cardiogram normal : reviewed report ? ?From a  thorough review of records, Medications tried that can be used in migraine management: tylenol, amlodipine, ibuprofen,fioricet, stadol, celexa, benadryl, prozac, ibuprofen, toradol, labetalol, magnesium, meclizine, reglan, naproxen, zofran, phenergan, propranolol, zoloft, tramadol,  ? ?CT head 10/31/2018: Ct showed No acute intracranial abnormalities including mass lesion or mass effect, hydrocephalus, extra-axial fluid collection, midline shift, hemorrhage, or acute infarction, large ischemic events (personally reviewed images) ? ?06/11/2015: MR MRV ? ?FINDINGS: ?Superior sagittal sinus, internal cerebral veins, vein of Galen, ?straight sinus, right transverse sinus, right sigmoid sinus, and ?jugular bulbs are patent without evidence of thrombus. Apparent lack ?of flow in the proximal right transverse sinus on MIP images is ?artifactual, without evidence of thrombus on the source images. ?  ?The left transverse and left sigmoid sinuses are hypoplastic with ?areas of signal dropout felt to be artifactual without discrete ?filling defects seen on source images to indicate thrombus. ?  ?IMPRESSION: ?No evidence of dural venous sinus thrombosis. 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 ? ?Review of Systems: ?Patient complains of symptoms per HPI as well as the following symptoms: headache, dizziness, medication overuse, social  stressors. Pertinent negatives and positives per HPI. All others negative. ? ? ?Social History  ? ?Socioeconomic History  ? Marital status: Significant Other  ?  Spouse name: Not on file  ? Number of children: 2  ? Years of education: Not on file  ? Highest education level: GED or equivalent  ?Occupational History  ? Not on file  ?Tobacco Use  ? Smoking status: Former  ?  Packs/day: 1.50  ?  Types: Cigarettes  ?  Quit date: 10/03/2020  ?  Years since quitting: 1.2  ? Smokeless tobacco: Never  ?Vaping Use  ? Vaping Use: Never used  ?Substance and Sexual Activity  ? Alcohol use: Not  Currently  ? Drug use: Never  ? Sexual activity: Yes  ?  Birth control/protection: Surgical  ?  Comment: tubal ligation  ?Other Topics Concern  ? Not on file  ?Social History Narrative  ? Lives at home with fiance and 2 children  ? Right handed  ? Caffeine: "a lot of pepsi" but also tries to drink a lot of water as well  ? ?Social Determinants of Health  ? ?Financial Resource Strain: Not on file  ?Food Insecurity: Not on file  ?Transportation Needs: Not on file  ?Physical Activity: Not on file  ?Stress: Not on file  ?Social Connections: Not on file  ?Intimate Partner Violence: Not on file  ? ? ?Family History  ?Problem Relation Age of Onset  ? High blood pressure Maternal Grandmother   ? Cancer Maternal Grandmother   ?     "they removed it so she's fine"  ? Diabetes Paternal Grandmother   ? Cancer Paternal Grandmother   ? Heart attack Father   ? Migraines Father   ? Heart attack Paternal Grandfather   ? ? ?Past Medical History:  ?Diagnosis Date  ? Anxiety   ? Depression   ? GERD (gastroesophageal reflux disease)   ? Headache   ? Hypertension   ? Hypothyroidism   ? Migraine   ? Noonan syndrome   ? S/P tubal ligation 10/06/2017  ? Stroke [facility])   ? SVD (spontaneous vaginal delivery) 10/05/2017  ? Thyroid disease   ? ? ?Patient Active Problem List  ? Diagnosis Date Noted  ? Chronic intractable headache 12/19/2021  ?  Chronic migraine without aura, with intractable migraine, so stated, with status migrainosus 11/25/2020  ? Severe recurrent major depression without psychotic features ([place]) 10/27/2018  ? Postpartum hypertension 10/30/2017  ? S/P tubal ligation 10/06/2017  ? SVD (spontaneous vaginal delivery) 10/05/2017  ? Labor and delivery indication for care or intervention 10/04/2017  ? Preeclampsia in postpartum period 06/07/2015  ? Intrauterine growth restriction affecting antepartum care of mother 05/29/2015  ? PROM (premature rupture of membranes) 05/27/2015  ? NAUSEA AND VOMITING 05/26/2009  ? ABDOMINAL PAIN, LUQ  05/26/2009  ? ? ?Past Surgical History:  ?Procedure Laterality Date  ? CHOLECYSTECTOMY N/A 04/16/2021  ? Procedure: LAPAROSCOPIC CHOLECYSTECTOMY;  Surgeon: Kinsinger, Arta Bruce, MD;  Location: [facility];  Service: Gene

## 2021-12-16 NOTE — Telephone Encounter (Signed)
Completed Emgality PA on Cover My Meds. [id number]. Awaiting determination from [facility] Rx Medicaid.  ?

## 2021-12-17 NOTE — Telephone Encounter (Signed)
Request Reference Number: [id number]. EMGALITY INJ 120MG /ML is approved through 12/17/2022. For further questions, call 12/19/2022 at [phone]. ?

## 2021-12-19 DIAGNOSIS — R519 Headache, unspecified: Secondary | ICD-10-CM | POA: Insufficient documentation

## 2021-12-19 MED ORDER — ASPIRIN 81 MG PO TBEC: 81.0000 mg | DELAYED_RELEASE_TABLET | Freq: Every day | ORAL | 3 refills | Status: DC

## 2021-12-22 ENCOUNTER — Ambulatory Visit: Payer: Medicaid Other | Admitting: Student

## 2021-12-22 ENCOUNTER — Encounter: Payer: Self-pay | Admitting: Student

## 2021-12-22 ENCOUNTER — Other Ambulatory Visit: Payer: Self-pay

## 2021-12-22 VITALS — BP 115/77 | HR 102 | Temp 98.3°F | Resp 17 | Ht <= 58 in | Wt 103.0 lb

## 2021-12-22 DIAGNOSIS — R002 Palpitations: Secondary | ICD-10-CM

## 2021-12-22 DIAGNOSIS — I491 Atrial premature depolarization: Secondary | ICD-10-CM

## 2021-12-22 DIAGNOSIS — I493 Ventricular premature depolarization: Secondary | ICD-10-CM

## 2021-12-22 DIAGNOSIS — I10 Essential (primary) hypertension: Secondary | ICD-10-CM

## 2021-12-22 MED ORDER — AMLODIPINE BESYLATE 5 MG PO TABS: 2.5000 mg | ORAL_TABLET | Freq: Every day | ORAL | 0 refills | Status: DC

## 2021-12-22 NOTE — Progress Notes (Signed)
? ?Primary Physician/Referring:  Ferd Hibbs, NP ? ?Patient ID: Christine Beard, female    DOB: 1991/01/04, 31 y.o.   MRN: [id number] ? ?Chief Complaint  ?Patient presents with  ? Follow-up  ?  1 YEAR  ? Hypertension  ? PAC/PVC  ? ?HPI:   ? ?Christine Beard  is a 31 y.o. Caucasian female with history of preeclampsia and postpartum hypertension, as well as intermittent smoking for the last 10 years. She has history of tubal ligation. She was referred to our office by PCP for evaluation and management of hypertension.  Patient denies history of coronary artery disease, hyperlipidemia, diabetes, TIA/CVA, DVT, pulmonary embolism.  Patient does not have details regarding family history but does know there is family history of heart disease on both her mother and father sides.  She has a history of tobacco use, quit January 2022. ? ?Patient has history of postpartum hypertension, requiring hospitalization and treatment with magnesium in the past. Patient has had extensive evaluation by neurology given history of cryptogenic stroke.  Neurology HA had previously ordered TEE to further evaluate for PFO/ASD, however this has not been done.  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 ? ?Overall patient's primary concern today is intermittent dizziness over the last few days without an identifiable trigger.  Denies chest pain, dyspnea, syncope.  She does report episodes of near syncope, "feeling like she might pass out".  Patient occasionally monitors her blood pressure at home and is noted readings as low as 947 mmHg systolic.  Patient's palpitations remain well controlled.  She does report stress and anxiety surrounding the health of her daughters currently. ? ?Past Medical History:  ?Diagnosis Date  ? Anxiety   ? Depression   ? GERD (gastroesophageal reflux disease)   ? Headache   ?  Hypertension   ? Hypothyroidism   ? Migraine   ? Noonan syndrome   ? S/P tubal ligation 10/06/2017  ? Stroke [facility])   ? SVD (spontaneous vaginal delivery) 10/05/2017  ? Thyroid disease   ? ?Past Surgical History:  ?Procedure Laterality Date  ? CHOLECYSTECTOMY N/A 04/16/2021  ? Procedure: LAPAROSCOPIC CHOLECYSTECTOMY;  Surgeon: Kinsinger, Arta Bruce, MD;  Location: [facility];  Service: General;  Laterality: N/A;  ? TOE SURGERY  04/2005  ? TONSILLECTOMY  11/2004  ? TUBAL LIGATION N/A 10/06/2017  ? Procedure: POST PARTUM TUBAL LIGATION;  Surgeon: Janyth Contes, MD;  Location: [place];  Service: Gynecology;  Laterality: N/A;  ? ?Family History  ?Problem Relation Age of Onset  ? High blood pressure Maternal Grandmother   ? Cancer Maternal Grandmother   ?     "they removed it so she's fine"  ? Diabetes Paternal Grandmother   ? Cancer Paternal Grandmother   ? Heart attack Father   ? Migraines Father   ? Heart attack Paternal Grandfather   ?  ?Social History  ? ?Tobacco Use  ? Smoking status: Former  ?  Packs/day: 1.50  ?  Types: Cigarettes  ?  Quit date: 10/03/2020  ?  Years since quitting: 1.2  ? Smokeless tobacco: Never  ?Substance Use Topics  ? Alcohol use: Not Currently  ? ?Marital Status: Significant Other  ? ?ROS  ?Review of Systems  ?Cardiovascular:  Positive for near-syncope. Negative for chest pain, claudication, dyspnea on exertion, leg swelling, orthopnea, palpitations, paroxysmal nocturnal dyspnea and syncope.  ?Neurological:  Positive for dizziness.  ? ?  Objective  ?Blood pressure 115/77, pulse (!) 102, temperature 98.3 ?F (36.8 ?C), temperature source Temporal, resp. rate 17, height '4\' 7"'  (1.397 m), weight 103 lb (46.7 kg), SpO2 99 %, unknown if currently breastfeeding.  ?Vitals with BMI 12/22/2021 12/16/2021 04/16/2021  ?Height '4\' 7"'  '4\' 7"'  -  ?Weight 103 lbs 106 lbs 8 oz -  ?BMI 23.94 24.75 -  ?Systolic 742 595 97  ?Diastolic 77 70 79  ?Pulse 102 85 69  ?Some encounter information is confidential and  restricted. Go to Review Flowsheets activity to see all data.  ?Orthostatic VS for the past 72 hrs (Last 3 readings): ? Orthostatic BP Patient Position BP Location Cuff Size Orthostatic Pulse  ?12/22/21 1200 107/67 Standing Left Arm Normal 81  ?12/22/21 1159 114/70 Sitting Left Arm Normal 97  ?12/22/21 1158 120/73 Supine Left Arm Normal 80  ? ? ? Physical Exam ?Vitals reviewed.  ?Cardiovascular:  ?   Rate and Rhythm: Normal rate and regular rhythm. Occasional Extrasystoles are present. ?   Pulses: Intact distal pulses.  ?   Heart sounds: S1 normal and S2 normal. No murmur heard. ?  No gallop.  ?Pulmonary:  ?   Effort: Pulmonary effort is normal. No respiratory distress.  ?   Breath sounds: No wheezing, rhonchi or rales.  ?Musculoskeletal:  ?   Right lower leg: No edema.  ?   Left lower leg: No edema.  ?Skin: ?   Capillary Refill: Capillary refill takes less than 2 seconds.  ?Neurological:  ?   General: No focal deficit present.  ?   Mental Status: She is alert and oriented to person, place, and time.  ? ? ?Laboratory examination:  ? ?CMP Latest Ref Rng & Units 04/10/2021 03/31/2021 12/10/2020  ?Glucose 70 - 99 mg/dL 89 - 106(H)  ?BUN 6 - 20 mg/dL 11 - 8  ?Creatinine 0.44 - 1.00 mg/dL 0.92 - 0.87  ?Sodium 135 - 145 mmol/L 142 - 138  ?Potassium 3.5 - 5.1 mmol/L 3.7 - 4.0  ?Chloride 98 - 111 mmol/L 113(H) - 104  ?CO2 22 - 32 mmol/L 22 - 17(L)  ?Calcium 8.9 - 10.3 mg/dL 9.3 - 9.5  ?Total Protein 6.5 - 8.1 g/dL 6.8 6.5 7.4  ?Total Bilirubin 0.3 - 1.2 mg/dL 0.5 <0.2 0.7  ?Alkaline Phos 38 - 126 U/L 67 134(H) 321(H)  ?AST 15 - 41 U/L 20 17 288(H)  ?ALT 0 - 44 U/L 22 20 722(HH)  ? ?CBC Latest Ref Rng & Units 04/10/2021 12/10/2020 10/28/2018  ?WBC 4.0 - 10.5 K/uL 5.3 9.8 5.7  ?Hemoglobin 12.0 - 15.0 g/dL 11.2(L) 14.1 12.3  ?Hematocrit 36.0 - 46.0 % 36.3 42.2 38.4  ?Platelets 150 - 400 K/uL 175 179 170  ? ?Lipid Panel  ?   ?Component Value Date/Time  ? CHOL 193 12/10/2020 1120  ? TRIG 38 12/10/2020 1120  ? HDL 79 12/10/2020 1120  ?  CHOLHDL 2.4 12/10/2020 1120  ? CHOLHDL 3.2 10/28/2018 0646  ? VLDL 47 (H) 10/28/2018 0646  ? LDLCALC 106 (H) 12/10/2020 1120  ? ?HEMOGLOBIN A1C ?Lab Results  ?Component Value Date  ? HGBA1C 4.4 (L) 12/10/2020  ? MPG 82.45 10/28/2018  ? ?TSH ?No results for input(s): TSH in the last 8760 hours. ?External labs:  ?10/27/2021: ?Magnesium 1.9 ?Sodium 139, potassium 4.0, BUN 13, creatinine 0.87, alk phos 65, ALT 11, AST 12, GFR >60 ?Hgb 12.7, HCT 38.4, platelet 163 ? ?03/12/2021: ?TSH 2.68 ? ?12/10/2020: ?Total cholesterol 193, HDL 79, LDL 106, triglycerides 38 ? ?  08/04/2020: ?Hemoglobin 12.3, hematocrit 37.4, MCV 91,  RDW 11.3, CBC otherwise normal ?Glucose 88, BUN 9, creatinine 0.58, GFR 123, sodium 140, potassium 4.7, chloride 109, calcium 8.4, CMP otherwise normal. ?TSH 4.08 (normal) ?Vitamin D 44.2 (normal) ? ?Allergies  ? ?Allergies  ?Allergen Reactions  ? Cinnamon Itching  ? Codeine Nausea And Vomiting  ?  ?Medications Prior to Visit:  ? ?Outpatient Medications Prior to Visit  ?Medication Sig Dispense Refill  ? ALPRAZolam (XANAX) 0.25 MG tablet Take 1-2 tablets (0.25-0.5 mg total) by mouth See admin instructions. Take 1-2 tabs (0.69m-0.50mg) 30-60 minutes before procedure. May repeat if needed.Do not drive.    ? amoxicillin (AMOXIL) 500 MG capsule Take 500 mg by mouth 3 (three) times daily.    ? aspirin ([employer] ASPIRIN LOW DOSE) 81 MG EC tablet Take 1 tablet (81 mg total) by mouth 5 (five) times daily. SWALLOW WHOLE. 90 tablet 3  ? Biotin 5 MG CAPS Take 5 mg by mouth daily.    ? busPIRone (BUSPAR) 15 MG tablet Take 15 mg by mouth 2 (two) times daily.    ? calcium carbonate (OSCAL) 1500 (600 Ca) MG TABS tablet Take 1,200 mg of elemental calcium by mouth daily.    ? cholecalciferol (VITAMIN D3) 25 MCG (1000 UNIT) tablet Take 1,000 Units by mouth daily.    ? fluticasone (VERAMYST) 27.5 MCG/SPRAY nasal spray Place 2 sprays into the nose daily.    ? Fluvoxamine Maleate 150 MG CP24 Take 300 mg by mouth at bedtime.    ?  Galcanezumab-gnlm (EMGALITY) 120 MG/ML SOAJ Inject 120 mg into the skin every 30 (thirty) days. 1.12 mL 11  ? levothyroxine (SYNTHROID) 25 MCG tablet Take 25 mcg by mouth every morning.    ? loratadine (CLARITIN) 10 MG ta

## 2022-02-26 ENCOUNTER — Telehealth: Payer: Self-pay | Admitting: Family Medicine

## 2022-02-26 NOTE — Telephone Encounter (Signed)
Rescheduled 04/26/22 appointment with pt over the phone- Aundra Millet out. Moved to [place], pt has never seen NP before.

## 2022-03-23 NOTE — Progress Notes (Unsigned)
Primary Physician/Referring:  Ferd Hibbs, NP  Patient ID: Christine Beard, female    DOB: 05-Mar-1991, 31 y.o.   MRN: [id number]  No chief complaint on file.  HPI:    ONESTY CLAIR  is a 31 y.o. Caucasian female with history of preeclampsia and postpartum hypertension, as well as intermittent smoking for the last 10 years. She has history of tubal ligation. She was referred to our office by PCP for evaluation and management of hypertension.  Patient denies history of coronary artery disease, hyperlipidemia, diabetes, TIA/CVA, DVT, pulmonary embolism.  Patient does not have details regarding family history but does know there is family history of heart disease on both her mother and father sides.  She has a history of tobacco use, quit January 2022.  Patient has history of postpartum hypertension, requiring hospitalization and treatment with magnesium in the past. Patient has had extensive evaluation by neurology given history of cryptogenic stroke.  Neurology HA had previously ordered TEE to further evaluate for PFO/ASD, however this has not been done.  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  Overall patient's primary concern today is intermittent dizziness over the last few days without an identifiable trigger.  Denies chest pain, dyspnea, syncope.  She does report episodes of near syncope, "feeling like she might pass out".  Patient occasionally monitors her blood pressure at home and is noted readings as low as 001 mmHg systolic.  Patient's palpitations remain well controlled.  She does report stress and anxiety surrounding the health of her daughters currently.  Past Medical History:  Diagnosis Date   Anxiety    Depression    GERD (gastroesophageal reflux disease)    Headache    Hypertension    Hypothyroidism    Migraine    Noonan syndrome    S/P  tubal ligation 10/06/2017   Stroke ([place])    SVD (spontaneous vaginal delivery) 10/05/2017   Thyroid disease    Past Surgical History:  Procedure Laterality Date   CHOLECYSTECTOMY N/A 04/16/2021   Procedure: LAPAROSCOPIC CHOLECYSTECTOMY;  Surgeon: Mickeal Skinner, MD;  Location: [facility];  Service: General;  Laterality: N/A;   TOE SURGERY  04/2005   TONSILLECTOMY  11/2004   TUBAL LIGATION N/A 10/06/2017   Procedure: POST PARTUM TUBAL LIGATION;  Surgeon: Janyth Contes, MD;  Location: [facility];  Service: Gynecology;  Laterality: N/A;   Family History  Problem Relation Age of Onset   High blood pressure Maternal Grandmother    Cancer Maternal Grandmother        "they removed it so she's fine"   Diabetes Paternal Grandmother    Cancer Paternal Grandmother    Heart attack Father    Migraines Father    Heart attack Paternal Grandfather     Social History   Tobacco Use   Smoking status: Former    Packs/day: 1.50    Types: Cigarettes    Quit date: 10/03/2020    Years since quitting: 1.4   Smokeless tobacco: Never  Substance Use Topics   Alcohol use: Not Currently   Marital Status: Significant Other   ROS  Review of Systems  Cardiovascular:  Positive for near-syncope. Negative for chest pain, claudication, dyspnea on exertion, leg swelling, orthopnea, palpitations, paroxysmal nocturnal dyspnea and syncope.  Neurological:  Positive for dizziness.    Objective  unknown if currently breastfeeding.     12/22/2021   11:22  AM 12/16/2021   10:49 AM 04/16/2021   10:48 AM  Vitals with BMI  Height '4\' 7"'  '4\' 7"'    Weight 103 lbs 106 lbs 8 oz   BMI 87.86 76.72   Systolic 094 709 97  Diastolic 77 70 79  Pulse 628 85 69  No data found.    Physical Exam Vitals reviewed.  Cardiovascular:     Rate and Rhythm: Normal rate and regular rhythm. Occasional Extrasystoles are present.    Pulses: Intact distal pulses.     Heart sounds: S1 normal and S2 normal. No murmur  heard.    No gallop.  Pulmonary:     Effort: Pulmonary effort is normal. No respiratory distress.     Breath sounds: No wheezing, rhonchi or rales.  Musculoskeletal:     Right lower leg: No edema.     Left lower leg: No edema.  Skin:    Capillary Refill: Capillary refill takes less than 2 seconds.  Neurological:     General: No focal deficit present.     Mental Status: She is alert and oriented to person, place, and time.     Laboratory examination:      Latest Ref Rng & Units 04/10/2021   10:22 AM 03/31/2021    4:17 PM 12/10/2020   11:20 AM  CMP  Glucose 70 - 99 mg/dL 89   106   BUN 6 - 20 mg/dL 11   8   Creatinine 0.44 - 1.00 mg/dL 0.92   0.87   Sodium 135 - 145 mmol/L 142   138   Potassium 3.5 - 5.1 mmol/L 3.7   4.0   Chloride 98 - 111 mmol/L 113   104   CO2 22 - 32 mmol/L 22   17   Calcium 8.9 - 10.3 mg/dL 9.3   9.5   Total Protein 6.5 - 8.1 g/dL 6.8  6.5  7.4   Total Bilirubin 0.3 - 1.2 mg/dL 0.5  <0.2  0.7   Alkaline Phos 38 - 126 U/L 67  134  321   AST 15 - 41 U/L 20  17  288   ALT 0 - 44 U/L 22  20  722       Latest Ref Rng & Units 04/10/2021   10:22 AM 12/10/2020   11:20 AM 10/28/2018    6:46 AM  CBC  WBC 4.0 - 10.5 K/uL 5.3  9.8  5.7   Hemoglobin 12.0 - 15.0 g/dL 11.2  14.1  12.3   Hematocrit 36.0 - 46.0 % 36.3  42.2  38.4   Platelets 150 - 400 K/uL 175  179  170    Lipid Panel     Component Value Date/Time   CHOL 193 12/10/2020 1120   TRIG 38 12/10/2020 1120   HDL 79 12/10/2020 1120   CHOLHDL 2.4 12/10/2020 1120   CHOLHDL 3.2 10/28/2018 0646   VLDL 47 (H) 10/28/2018 0646   LDLCALC 106 (H) 12/10/2020 1120   HEMOGLOBIN A1C Lab Results  Component Value Date   HGBA1C 4.4 (L) 12/10/2020   MPG 82.45 10/28/2018   TSH No results for input(s): "TSH" in the last 8760 hours. External labs:  10/27/2021: Magnesium 1.9 Sodium 139, potassium 4.0, BUN 13, creatinine 0.87, alk phos 65, ALT 11, AST 12, GFR >60 Hgb 12.7, HCT 38.4, platelet 163  03/12/2021: TSH  2.68  12/10/2020: Total cholesterol 193, HDL 79, LDL 106, triglycerides 38  08/04/2020: Hemoglobin 12.3, hematocrit 37.4, MCV 91,  RDW 11.3, CBC  otherwise normal Glucose 88, BUN 9, creatinine 0.58, GFR 123, sodium 140, potassium 4.7, chloride 109, calcium 8.4, CMP otherwise normal. TSH 4.08 (normal) Vitamin D 44.2 (normal)  Allergies   Allergies  Allergen Reactions   Cinnamon Itching   Codeine Nausea And Vomiting    Medications Prior to Visit:   Outpatient Medications Prior to Visit  Medication Sig Dispense Refill   ALPRAZolam (XANAX) 0.25 MG tablet Take 1-2 tablets (0.25-0.5 mg total) by mouth See admin instructions. Take 1-2 tabs (0.37m-0.50mg) 30-60 minutes before procedure. May repeat if needed.Do not drive.     amLODipine (NORVASC) 5 MG tablet Take 0.5 tablets (2.5 mg total) by mouth daily. 90 tablet 0   amoxicillin (AMOXIL) 500 MG capsule Take 500 mg by mouth 3 (three) times daily.     aspirin ([employer] ASPIRIN LOW DOSE) 81 MG EC tablet Take 1 tablet (81 mg total) by mouth 5 (five) times daily. SWALLOW WHOLE. 90 tablet 3   atorvastatin (LIPITOR) 20 MG tablet Take 1 tablet (20 mg total) by mouth daily. (Patient not taking: Reported on 12/22/2021) 30 tablet 11   Biotin 5 MG CAPS Take 5 mg by mouth daily.     busPIRone (BUSPAR) 15 MG tablet Take 15 mg by mouth 2 (two) times daily.     calcium carbonate (OSCAL) 1500 (600 Ca) MG TABS tablet Take 1,200 mg of elemental calcium by mouth daily.     cholecalciferol (VITAMIN D3) 25 MCG (1000 UNIT) tablet Take 1,000 Units by mouth daily.     fluticasone (VERAMYST) 27.5 MCG/SPRAY nasal spray Place 2 sprays into the nose daily.     Fluvoxamine Maleate 150 MG CP24 Take 300 mg by mouth at bedtime.     Galcanezumab-gnlm (EMGALITY) 120 MG/ML SOAJ Inject 120 mg into the skin every 30 (thirty) days. 1.12 mL 11   levothyroxine (SYNTHROID) 25 MCG tablet Take 25 mcg by mouth every morning.     loratadine (CLARITIN) 10 MG tablet Take 10 mg by mouth daily.      metoCLOPramide (REGLAN) 10 MG tablet TAKE 1 TABLET (10 MG TOTAL) BY MOUTH EVERY 6 (SIX) HOURS AS NEEDED. FOR MIGRAINE OR NAUSEA. 30 tablet 0   Multiple Vitamins-Minerals (ONE-A-DAY FOR HER VITACRAVES PO) Take 1 tablet by mouth daily.     Omega-3 Fatty Acids (FISH OIL) 1000 MG CAPS Take 1,000 mg by mouth daily.     ondansetron (ZOFRAN) 8 MG tablet Take 8 mg by mouth every 8 (eight) hours as needed for nausea/vomiting.     oxyCODONE (OXY IR/ROXICODONE) 5 MG immediate release tablet Take 1 tablet (5 mg total) by mouth every 6 (six) hours as needed for severe pain. 20 tablet 0   No facility-administered medications prior to visit.   Final Medications at End of Visit    No outpatient medications have been marked as taking for the 03/24/22 encounter (Appointment) with CRayetta Pigg Madalyne Husk C, PA-C.   Radiology:   No results found. Transcranial Doppler with bubbles 01/23/2021: No HITS at rest or during Valsalva. Negative transcranial Doppler Bubble study with no evidence of right to left intracardiac communication.   A vascular evaluation was performed. The right middle cerebral artery was studied. An IV was inserted into the patient's left antecubital. Verbal informed consent was obtained.  Cardiac Studies:   ABI 11/20/2020:  This exam reveals mildly decreased perfusion of the right lower extremity, noted at the anterior tibial and post tibial artery level (ABI 0.89) and mildly decreased perfusion of the left lower extremity,  noted at the anterior tibial and post tibial artery level (ABI 0.80). There is mildly abnormal biphasic waveform at the PT arteries bilaterally.   PCV ECHOCARDIOGRAM COMPLETE 14/97/0263 Normal LV systolic function with visual EF 55-60%. Left ventricle cavity is normal in size. Normal global wall motion. Normal diastolic filling pattern, normal LAP. Mild tricuspid regurgitation. No evidence of pulmonary hypertension. No prior study for comparison.   Long-term monitor 7 days  11/03/2020-11/10/2020: Predominant underlying rhythm was sinus with minimum heart rate of 57 bpm, maximum heart rate of 153 bpm, average heart rate of 85 bpm.  Symptoms correlated with sinus rates >90 bpm, PVCs and PACs.  PACs and PVCs were rare, PAC couplets and triplets were rare.  No PVC couplets or triplets.  No ventricular tachycardia, pauses >3 seconds, high degree AV block, atrial fibrillation, or supraventricular tachycardia greater than 4 beats.   EKG:  12/22/2021: Sinus rhythm at a rate of 80 bpm.  Normal axis.  No evidence of ischemia or underlying injury pattern.  Compared EKG 11/03/2020, no significant change.  Assessment   No diagnosis found.    There are no discontinued medications.   No orders of the defined types were placed in this encounter.   Recommendations:   EYLEEN RAWLINSON is a 31 y.o. Caucasian female with history of preeclampsia and postpartum hypertension, as well as intermittent smoking for the last 10 years. She has history of tubal ligation. She was referred to our office by PCP for evaluation and management of hypertension.  Patient denies history of coronary artery disease, hyperlipidemia, diabetes, TIA/CVA, DVT, pulmonary embolism.  Patient does not have details regarding family history but does know there is family history of heart disease on both her mother and father sides.  She has a history of tobacco use, quit January 2022.  Patient has history of postpartum hypertension, requiring hospitalization and treatment with magnesium in the past. Patient has had extensive evaluation by neurology given history of cryptogenic stroke.  Neurology HA had previously ordered TEE to further evaluate for PFO/ASD, however this has not been done.  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   Given history of cyst of Noonan syndrome, discussed with  patient option to proceed with TEE.  Discussed alternatives, risk, benefits, indications with patient at length.  Patient verbalized understanding and wishes to proceed with TEE.  Patient's cardiac work-up has otherwise been unyielding including cardiac monitor, and thoracic echocardiogram, transcranial Doppler.  Notably patient's blood pressure is soft which may be contributing to episodes of dizziness, will therefore reduce amlodipine from 5 mg to 2.5 mg daily.  Advised patient to continue aspirin therapy and to start statin therapy as prescribed by neurology and June of last year, patient verbalized understanding agreement.  Further recommendations pending results of transesophageal echocardiogram.  We will tentatively plan to follow-up in 3 months.  Patient was seen in collaboration with Dr. Einar Gip and he is in agreement with the plan.     Alethia Berthold, PA-C 03/23/2022, 1:46 PM Office: [phone]

## 2022-03-24 ENCOUNTER — Encounter: Payer: Self-pay | Admitting: Student

## 2022-03-24 ENCOUNTER — Ambulatory Visit: Payer: Medicaid Other | Admitting: Student

## 2022-03-24 VITALS — BP 115/76 | HR 96 | Temp 98.7°F | Resp 16 | Ht <= 58 in | Wt 99.6 lb

## 2022-03-24 DIAGNOSIS — I491 Atrial premature depolarization: Secondary | ICD-10-CM

## 2022-03-24 DIAGNOSIS — Z8673 Personal history of transient ischemic attack (TIA), and cerebral infarction without residual deficits: Secondary | ICD-10-CM

## 2022-03-24 DIAGNOSIS — I493 Ventricular premature depolarization: Secondary | ICD-10-CM

## 2022-04-20 ENCOUNTER — Encounter (HOSPITAL_COMMUNITY): Payer: Self-pay | Admitting: Cardiology

## 2022-04-26 ENCOUNTER — Ambulatory Visit: Payer: Medicaid Other | Admitting: Adult Health

## 2022-04-27 ENCOUNTER — Ambulatory Visit (HOSPITAL_COMMUNITY)
Admission: RE | Admit: 2022-04-27 | Discharge: 2022-04-27 | Disposition: A | Discharge status: Home / Self Care | Payer: Medicaid Other | Attending: Cardiology | Admitting: Cardiology

## 2022-04-27 ENCOUNTER — Ambulatory Visit (HOSPITAL_BASED_OUTPATIENT_CLINIC_OR_DEPARTMENT_OTHER): Payer: Medicaid Other | Admitting: Anesthesiology

## 2022-04-27 ENCOUNTER — Encounter (HOSPITAL_COMMUNITY)
Admission: RE | Disposition: A | Discharge status: Home / Self Care | Payer: Self-pay | Source: Home / Self Care | Attending: Cardiology

## 2022-04-27 ENCOUNTER — Ambulatory Visit (HOSPITAL_COMMUNITY): Payer: Medicaid Other | Admitting: Anesthesiology

## 2022-04-27 ENCOUNTER — Other Ambulatory Visit: Payer: Self-pay

## 2022-04-27 ENCOUNTER — Ambulatory Visit (HOSPITAL_COMMUNITY)
Admission: RE | Admit: 2022-04-27 | Discharge: 2022-04-27 | Disposition: A | Discharge status: Home / Self Care | Payer: Medicaid Other | Source: Ambulatory Visit | Attending: Cardiology | Admitting: Cardiology

## 2022-04-27 ENCOUNTER — Encounter (HOSPITAL_COMMUNITY): Payer: Self-pay | Admitting: Cardiology

## 2022-04-27 DIAGNOSIS — E039 Hypothyroidism, unspecified: Secondary | ICD-10-CM | POA: Diagnosis not present

## 2022-04-27 DIAGNOSIS — I10 Essential (primary) hypertension: Secondary | ICD-10-CM

## 2022-04-27 DIAGNOSIS — I639 Cerebral infarction, unspecified: Secondary | ICD-10-CM | POA: Diagnosis not present

## 2022-04-27 DIAGNOSIS — Z8249 Family history of ischemic heart disease and other diseases of the circulatory system: Secondary | ICD-10-CM | POA: Diagnosis not present

## 2022-04-27 DIAGNOSIS — Z8673 Personal history of transient ischemic attack (TIA), and cerebral infarction without residual deficits: Secondary | ICD-10-CM | POA: Diagnosis present

## 2022-04-27 DIAGNOSIS — Z87891 Personal history of nicotine dependence: Secondary | ICD-10-CM

## 2022-04-27 DIAGNOSIS — F32A Depression, unspecified: Secondary | ICD-10-CM | POA: Insufficient documentation

## 2022-04-27 DIAGNOSIS — F419 Anxiety disorder, unspecified: Secondary | ICD-10-CM | POA: Diagnosis not present

## 2022-04-27 HISTORY — PX: BUBBLE STUDY: SHX6837

## 2022-04-27 HISTORY — PX: TEE WITHOUT CARDIOVERSION: SHX5443

## 2022-04-27 SURGERY — ECHOCARDIOGRAM, TRANSESOPHAGEAL
Anesthesia: Monitor Anesthesia Care

## 2022-04-27 MED ORDER — LIDOCAINE 2% (20 MG/ML) 5 ML SYRINGE
INTRAMUSCULAR | Status: DC | PRN
  Administered 2022-04-27: 100 mg via INTRAVENOUS

## 2022-04-27 MED ORDER — SODIUM CHLORIDE 0.9 % IV SOLN: INTRAVENOUS | Status: DC

## 2022-04-27 MED ORDER — PROPOFOL 500 MG/50ML IV EMUL
INTRAVENOUS | Status: DC | PRN
  Administered 2022-04-27: 150 ug/kg/min via INTRAVENOUS

## 2022-04-27 MED ORDER — PROPOFOL 10 MG/ML IV BOLUS
INTRAVENOUS | Status: DC | PRN
  Administered 2022-04-27: 20 mg via INTRAVENOUS
  Administered 2022-04-27: 50 mg via INTRAVENOUS

## 2022-04-27 NOTE — Anesthesia Postprocedure Evaluation (Signed)
Anesthesia Post Note  Patient: Christine Beard  Procedure(s) Performed: TRANSESOPHAGEAL ECHOCARDIOGRAM (TEE) BUBBLE STUDY     Patient location during evaluation: Endoscopy Anesthesia Type: MAC Level of consciousness: awake and alert Pain management: pain level controlled Vital Signs Assessment: post-procedure vital signs reviewed and stable Respiratory status: spontaneous breathing, nonlabored ventilation and respiratory function stable Cardiovascular status: stable and blood pressure returned to baseline Postop Assessment: no apparent nausea or vomiting Anesthetic complications: no   No notable events documented.  Last Vitals:  Vitals:   04/27/22 1120 04/27/22 1130  BP: 110/81 112/71  Pulse: 81 80  Resp: 10 11  Temp:    SpO2: 100% 100%    Last Pain:  Vitals:   04/27/22 1130  TempSrc:   PainSc: 0-No pain                 Belenda Cruise P Judye Lorino

## 2022-04-27 NOTE — Progress Notes (Signed)
  Echocardiogram Echocardiogram Transesophageal has been performed.  Augustine Radar 04/27/2022, 11:23 AM

## 2022-04-27 NOTE — Discharge Instructions (Signed)

## 2022-04-27 NOTE — Anesthesia Preprocedure Evaluation (Addendum)
Anesthesia Evaluation  Patient identified by MRN, date of birth, ID band Patient awake    Reviewed: Allergy & Precautions, NPO status , Patient's Chart, lab work & pertinent test results  Airway Mallampati: II  TM Distance: >3 FB Neck ROM: Full    Dental no notable dental hx.    Pulmonary neg pulmonary ROS, former smoker,    Pulmonary exam normal        Cardiovascular hypertension, Pt. on medications  Rhythm:Regular Rate:Normal     Neuro/Psych  Headaches, Anxiety Depression CVA    GI/Hepatic Neg liver ROS, GERD  ,  Endo/Other  Hypothyroidism   Renal/GU negative Renal ROS  negative genitourinary   Musculoskeletal negative musculoskeletal ROS (+)   Abdominal Normal abdominal exam  (+)   Peds  Hematology negative hematology ROS (+)   Anesthesia Other Findings   Reproductive/Obstetrics                             Anesthesia Physical Anesthesia Plan  ASA: 3  Anesthesia Plan: MAC   Post-op Pain Management:    Induction: Intravenous  PONV Risk Score and Plan: 2 and Propofol infusion and Treatment may vary due to age or medical condition  Airway Management Planned: Simple Face Mask, Natural Airway and Nasal Cannula  Additional Equipment: None  Intra-op Plan:   Post-operative Plan:   Informed Consent: I have reviewed the patients History and Physical, chart, labs and discussed the procedure including the risks, benefits and alternatives for the proposed anesthesia with the patient or authorized representative who has indicated his/her understanding and acceptance.     Dental advisory given  Plan Discussed with: CRNA  Anesthesia Plan Comments: (Lab Results      Component                Value               Date                      WBC                      5.3                 04/10/2021                HGB                      11.2 (L)            04/10/2021                HCT                       36.3                04/10/2021                MCV                      91.2                04/10/2021                PLT                      175  04/10/2021           Lab Results      Component                Value               Date                      NA                       142                 04/10/2021                K                        3.7                 04/10/2021                CO2                      22                  04/10/2021                GLUCOSE                  89                  04/10/2021                BUN                      11                  04/10/2021                CREATININE               0.92                04/10/2021                CALCIUM                  9.3                 04/10/2021                EGFR                     91                  12/10/2020                GFRNONAA                 >60                 04/10/2021          )        Anesthesia Quick Evaluation

## 2022-04-27 NOTE — CV Procedure (Signed)
TEE: Under MAC sedation, TEE was performed without complications: LV: Normal. Normal EF. RV: Normal LA: Normal. Left atrial appendage: Normal without thrombus. Normal function. Inter atrial septum is intact without defect. Double contrast study negative for atrial level shunting. Late appearance of bubbles, mildly positive for probable intra pulmonary AV shunting.Marland Kitchen RA: Normal  MV: Normal Trace MR. TV: Normal Trace TR AV: Normal. No AI or AS. PV: Normal. Trace PI.  Thoracic and ascending aorta: Normal without significant plaque or atheromatous changes.   Adrian Prows, MD, [facility] 04/27/2022, 10:59 AM Office: [phone] Fax: [phone] Pager: [phone]

## 2022-04-27 NOTE — Progress Notes (Signed)
Primary Physician/Referring:  Ferd Hibbs, NP  Patient ID: Christine Beard, female    DOB: 1991-01-29, 31 y.o.   MRN: [id number]  Chief complaints: Cryptogenic stroke, presents for TEE  HPI:    Christine Beard  is a 31 y.o. Caucasian female with history of preeclampsia and postpartum hypertension, as well as intermittent smoking for the last 10 years. Patient denies history of coronary artery disease, hyperlipidemia, diabetes, TIA/CVA, DVT, pulmonary embolism.  Patient does not have details regarding family history but does know there is family history of heart disease on both her mother and father sides.  She has a history of tobacco use, quit January 2022.  Patient has history of postpartum hypertension, requiring hospitalization and treatment with magnesium in the past. Patient has had extensive evaluation by neurology given history of cryptogenic stroke.  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  She is now being treated for hypertension, in view of prior history of TIA, cryptogenic stroke, she was recommended to have TEE.  She now presents for the procedure.  No new symptomatology. Past Medical History:  Diagnosis Date   Anxiety    Depression    GERD (gastroesophageal reflux disease)    Headache    Hypertension    Hypothyroidism    Migraine    Noonan syndrome    S/P tubal ligation 10/06/2017   Stroke ([place])    SVD (spontaneous vaginal delivery) 10/05/2017   Thyroid disease    Past Surgical History:  Procedure Laterality Date   CHOLECYSTECTOMY N/A 04/16/2021   Procedure: LAPAROSCOPIC CHOLECYSTECTOMY;  Surgeon: Mickeal Skinner, MD;  Location: [facility];  Service: General;  Laterality: N/A;   TOE SURGERY  04/2005   TONSILLECTOMY  11/2004   TUBAL LIGATION N/A 10/06/2017   Procedure: POST PARTUM TUBAL LIGATION;  Surgeon: Janyth Contes, MD;  Location: [facility];  Service: Gynecology;  Laterality: N/A;   Family  History  Problem Relation Age of Onset   High blood pressure Maternal Grandmother    Cancer Maternal Grandmother        "they removed it so she's fine"   Diabetes Paternal Grandmother    Cancer Paternal Grandmother    Heart attack Father    Migraines Father    Heart attack Paternal Grandfather     Social History   Tobacco Use   Smoking status: Former    Packs/day: 1.50    Types: Cigarettes    Quit date: 10/03/2020    Years since quitting: 1.5   Smokeless tobacco: Never  Substance Use Topics   Alcohol use: Not Currently   Marital Status: Significant Other   ROS  Review of Systems  Cardiovascular:  Positive for palpitations. Negative for chest pain, claudication, dyspnea on exertion, leg swelling, near-syncope, orthopnea, paroxysmal nocturnal dyspnea and syncope.  Neurological:  Negative for dizziness.   Objective  Blood pressure 110/81, pulse 78, temperature 98.5 F (36.9 C), temperature source Temporal, resp. rate 15, height '4\' 7"'  (1.397 m), weight 43.5 kg, last menstrual period 04/19/2022, SpO2 100 %, unknown if currently breastfeeding.     04/27/2022   10:29 AM 03/24/2022   11:00 AM 12/22/2021   11:22 AM  Vitals with BMI  Height '4\' 7"'  '4\' 7"'  '4\' 7"'   Weight 96 lbs 99 lbs 10 oz 103 lbs  BMI 22.31 43.15 40.08  Systolic 676 195 093  Diastolic 81 76 77  Pulse 78 96 102  No data found.    Physical Exam  Vitals reviewed.  Cardiovascular:     Rate and Rhythm: Normal rate and regular rhythm. Occasional Extrasystoles are present.    Pulses: Intact distal pulses.     Heart sounds: S1 normal and S2 normal. No murmur heard.    No gallop.  Pulmonary:     Effort: Pulmonary effort is normal. No respiratory distress.     Breath sounds: No wheezing, rhonchi or rales.  Musculoskeletal:     Right lower leg: No edema.     Left lower leg: No edema.  Neurological:     General: No focal deficit present.     Mental Status: She is oriented to person, place, and time.    Laboratory  examination:      Latest Ref Rng & Units 04/10/2021   10:22 AM 03/31/2021    4:17 PM 12/10/2020   11:20 AM  CMP  Glucose 70 - 99 mg/dL 89   106   BUN 6 - 20 mg/dL 11   8   Creatinine 0.44 - 1.00 mg/dL 0.92   0.87   Sodium 135 - 145 mmol/L 142   138   Potassium 3.5 - 5.1 mmol/L 3.7   4.0   Chloride 98 - 111 mmol/L 113   104   CO2 22 - 32 mmol/L 22   17   Calcium 8.9 - 10.3 mg/dL 9.3   9.5   Total Protein 6.5 - 8.1 g/dL 6.8  6.5  7.4   Total Bilirubin 0.3 - 1.2 mg/dL 0.5  <0.2  0.7   Alkaline Phos 38 - 126 U/L 67  134  321   AST 15 - 41 U/L 20  17  288   ALT 0 - 44 U/L 22  20  722       Latest Ref Rng & Units 04/10/2021   10:22 AM 12/10/2020   11:20 AM 10/28/2018    6:46 AM  CBC  WBC 4.0 - 10.5 K/uL 5.3  9.8  5.7   Hemoglobin 12.0 - 15.0 g/dL 11.2  14.1  12.3   Hematocrit 36.0 - 46.0 % 36.3  42.2  38.4   Platelets 150 - 400 K/uL 175  179  170    Lipid Panel     Component Value Date/Time   CHOL 193 12/10/2020 1120   TRIG 38 12/10/2020 1120   HDL 79 12/10/2020 1120   CHOLHDL 2.4 12/10/2020 1120   CHOLHDL 3.2 10/28/2018 0646   VLDL 47 (H) 10/28/2018 0646   LDLCALC 106 (H) 12/10/2020 1120   HEMOGLOBIN A1C Lab Results  Component Value Date   HGBA1C 4.4 (L) 12/10/2020   MPG 82.45 10/28/2018   TSH No results for input(s): "TSH" in the last 8760 hours. External labs:  10/27/2021: Magnesium 1.9 Sodium 139, potassium 4.0, BUN 13, creatinine 0.87, alk phos 65, ALT 11, AST 12, GFR >60 Hgb 12.7, HCT 38.4, platelet 163  03/12/2021: TSH 2.68  12/10/2020: Total cholesterol 193, HDL 79, LDL 106, triglycerides 38  08/04/2020: Hemoglobin 12.3, hematocrit 37.4, MCV 91,  RDW 11.3, CBC otherwise normal Glucose 88, BUN 9, creatinine 0.58, GFR 123, sodium 140, potassium 4.7, chloride 109, calcium 8.4, CMP otherwise normal. TSH 4.08 (normal) Vitamin D 44.2 (normal)  Allergies   Allergies  Allergen Reactions   Cinnamon Itching   Codeine Nausea And Vomiting    Final Medications at End  of Visit    Current Meds  Medication Sig   amLODipine (NORVASC) 5 MG tablet Take 0.5 tablets (2.5 mg total) by  mouth daily.   aspirin ([employer] ASPIRIN LOW DOSE) 81 MG EC tablet Take 1 tablet (81 mg total) by mouth 5 (five) times daily. SWALLOW WHOLE. (Patient taking differently: Take 81 mg by mouth daily. SWALLOW WHOLE.)   atorvastatin (LIPITOR) 20 MG tablet Take 20 mg by mouth daily.   Biotin 5 MG CAPS Take 5 mg by mouth daily.   buPROPion (WELLBUTRIN XL) 300 MG 24 hr tablet Take 300 mg by mouth daily.   busPIRone (BUSPAR) 15 MG tablet Take 15 mg by mouth daily.   calcium carbonate (OS-CAL) 600 MG TABS tablet Take 1,200 mg of elemental calcium by mouth daily.   cholecalciferol (VITAMIN D3) 25 MCG (1000 UNIT) tablet Take 1,000 Units by mouth daily.   ELDERBERRY PO Take 1 tablet by mouth daily.   fluticasone (VERAMYST) 27.5 MCG/SPRAY nasal spray Place 2 sprays into the nose daily.   Fluvoxamine Maleate 150 MG CP24 Take 300 mg by mouth at bedtime.   Galcanezumab-gnlm (EMGALITY) 120 MG/ML SOAJ Inject 120 mg into the skin every 30 (thirty) days.   levothyroxine (SYNTHROID) 25 MCG tablet Take 25 mcg by mouth daily before breakfast.   loratadine (CLARITIN) 10 MG tablet Take 10 mg by mouth daily.   metoCLOPramide (REGLAN) 10 MG tablet TAKE 1 TABLET (10 MG TOTAL) BY MOUTH EVERY 6 (SIX) HOURS AS NEEDED. FOR MIGRAINE OR NAUSEA.   Multiple Vitamins-Minerals (ONE-A-DAY FOR HER VITACRAVES PO) Take 1 tablet by mouth daily.   Omega-3 Fatty Acids (FISH OIL) 1000 MG CAPS Take 1,000 mg by mouth daily.   ondansetron (ZOFRAN) 8 MG tablet Take 8 mg by mouth every 8 (eight) hours as needed for nausea/vomiting.   traZODone (DESYREL) 50 MG tablet Take 50 mg by mouth at bedtime.   vitamin B-12 (CYANOCOBALAMIN) 1000 MCG tablet Take 1,000 mcg by mouth daily.   Radiology:   No results found. Transcranial Doppler with bubbles 01/23/2021: No HITS at rest or during Valsalva. Negative transcranial Doppler Bubble study with  no evidence of right to left intracardiac communication.   A vascular evaluation was performed. The right middle cerebral artery was studied. An IV was inserted into the patient's left antecubital. Verbal informed consent was obtained.  Cardiac Studies:   ABI 11/20/2020:  This exam reveals mildly decreased perfusion of the right lower extremity, noted at the anterior tibial and post tibial artery level (ABI 0.89) and mildly decreased perfusion of the left lower extremity, noted at the anterior tibial and post tibial artery level (ABI 0.80). There is mildly abnormal biphasic waveform at the PT arteries bilaterally.   PCV ECHOCARDIOGRAM COMPLETE 28/08/8866 Normal LV systolic function with visual EF 55-60%. Left ventricle cavity is normal in size. Normal global wall motion. Normal diastolic filling pattern, normal LAP. Mild tricuspid regurgitation. No evidence of pulmonary hypertension. No prior study for comparison.   Long-term monitor 7 days 11/03/2020-11/10/2020: Predominant underlying rhythm was sinus with minimum heart rate of 57 bpm, maximum heart rate of 153 bpm, average heart rate of 85 bpm.  Symptoms correlated with sinus rates >90 bpm, PVCs and PACs.  PACs and PVCs were rare, PAC couplets and triplets were rare.  No PVC couplets or triplets.  No ventricular tachycardia, pauses >3 seconds, high degree AV block, atrial fibrillation, or supraventricular tachycardia greater than 4 beats.   EKG:  12/22/2021: Sinus rhythm at a rate of 80 bpm.  Normal axis.  No evidence of ischemia or underlying injury pattern.  Compared EKG 11/03/2020, no significant change.  Assessment  ICD-10-CM   1. Cryptogenic stroke ([place])  I63.9 ECHO TEE    ECHO TEE      Recommendations:   Christine Beard is a 31 y.o. Caucasian female now presents for evaluation of cryptogenic stroke and TEE to the office.  She has no new symptoms, she has not had any new neurologic deficits.  I have discussed regarding the risks  associated with TEE including but not limited to aspiration pneumonia, trauma to her upper airway, mild bleeding complication, and rarely major complication including esophageal perforation.  Patient is aware and is willing to proceed.   Adrian Prows, PA-C 04/27/2022, 10:34 AM Office: [phone]

## 2022-04-27 NOTE — Anesthesia Procedure Notes (Signed)
Procedure Name: MAC Date/Time: 04/27/2022 10:45 AM  Performed by: Dorann Lodge, CRNAPre-anesthesia Checklist: Patient identified, Emergency Drugs available, Suction available and Patient being monitored Patient Re-evaluated:Patient Re-evaluated prior to induction Oxygen Delivery Method: Nasal cannula Preoxygenation: Pre-oxygenation with 100% oxygen Airway Equipment and Method: Bite block Dental Injury: Teeth and Oropharynx as per pre-operative assessment

## 2022-04-27 NOTE — Transfer of Care (Signed)
Immediate Anesthesia Transfer of Care Note  Patient: Christine Beard  Procedure(s) Performed: TRANSESOPHAGEAL ECHOCARDIOGRAM (TEE) BUBBLE STUDY  Patient Location: Endoscopy Unit  Anesthesia Type:MAC  Level of Consciousness: awake and alert   Airway & Oxygen Therapy: Patient Spontanous Breathing  Post-op Assessment: Report given to RN and Post -op Vital signs reviewed and stable  Post vital signs: Reviewed and stable  Last Vitals:  Vitals Value Taken Time  BP    Temp    Pulse 96 04/27/22 1103  Resp 23 04/27/22 1103  SpO2 99 % 04/27/22 1103  Vitals shown include unvalidated device data.  Last Pain:  Vitals:   04/27/22 1029  TempSrc: Temporal  PainSc: 0-No pain         Complications: No notable events documented.

## 2022-04-28 ENCOUNTER — Encounter (HOSPITAL_COMMUNITY): Payer: Self-pay | Admitting: Cardiology

## 2022-04-29 ENCOUNTER — Other Ambulatory Visit: Payer: Self-pay | Admitting: Family Medicine

## 2022-04-29 ENCOUNTER — Ambulatory Visit: Payer: Medicaid Other | Admitting: Family Medicine

## 2022-04-29 ENCOUNTER — Encounter: Payer: Self-pay | Admitting: Family Medicine

## 2022-04-29 VITALS — BP 103/65 | HR 82 | Ht <= 58 in | Wt 97.0 lb

## 2022-04-29 DIAGNOSIS — I639 Cerebral infarction, unspecified: Secondary | ICD-10-CM | POA: Diagnosis not present

## 2022-04-29 DIAGNOSIS — G43719 Chronic migraine without aura, intractable, without status migrainosus: Secondary | ICD-10-CM

## 2022-04-29 MED ORDER — QULIPTA 60 MG PO TABS: 60.0000 mg | ORAL_TABLET | Freq: Every day | ORAL | 3 refills | Status: DC

## 2022-04-29 MED ORDER — NURTEC 75 MG PO TBDP: 75.0000 mg | ORAL_TABLET | Freq: Every day | ORAL | 11 refills | Status: DC | PRN

## 2022-04-29 NOTE — Progress Notes (Signed)
Chief Complaint  Patient presents with   Follow-up    Pt alone, rm 14, taking emgality and helps initially but around 3rd week feels like its wearing off. She is noticing increase in occurrence and states the injections hurt     HISTORY OF PRESENT ILLNESS:  04/29/22 ALL:  Guy Beginatricia A Sebastiani is a 31 y.o. female here today for follow up for  migraines and previous CVA. She was seen by Dr Lucia GaskinsAhern 12/2021 and started on Emgality. She reports that headaches are significantly better. She does feel that Emgality wears off after three weeks. She also reports pain with injections. She reports having 8-12 headache days with 5-7 migraines per month. She usually takes ondansetron or metoclopramide for abortive therapy.   She continues atorvastatin 20mg  and asa 81mg  daily. She is followed closely by PCP. She was diagnosed with Noonan Syndrome. She had TEE 04/27/2022 that showed EF 60-65% and was positive with shunting observed after >6 cardiac cycles suggestive of intrapulmonary shunting. There is no evidence of a patent foramen ovale. There is no evidence of an atrial septal defect. She is followed closely by PCP and cardiology. BP . She quit smoking 4-5 years ago.   HISTORY (copied from Dr Trevor MaceAhern's previous note)  12/16/2021: Here for follow up of migraines. The Topiramate is not working. She had tubal ligation no chance of having children. Having daily migraines.  Discussed  the multiple options that she has, at this time she has failed multiple medications. She has daily headaches and > 15 migraine days a month, unilateral, pulsating pounding, nausea, lasting up to 24 hours, photophobia and phonophobia.  We discussed other oral traditional medications, we discussed the new class of CGRP such as Aimovig, Ajovy, Emgality, we also discussed atogepant and we decided on Emgality.  We discussed migraine management, lifestyle factors, preventative and acute management.  I also encouraged her not to miss her daily  aspirin, she had an extensive work-up for stroke after finding a left thalamic infarct of cryptogenic etiology, extensive blood work only showed mildly elevated IgM anticardiolipin antibody of unclear significance in the absence of other clinical symptomatology of antiphospholipid antibody syndrome, her vascular risk factors are mild hyperlipidemia and smoking and we discussed that again today.   MRI brain 12/05/2020: personally reviewed and agree with findings IMPRESSION: This MRI of the brain with and without contrast shows the following: 1.   4 mm T2 hyperintense focus in the left thalamus consistent with a small focus of gliosis or lacunar infarction.  Though more subtle on CT scan, it appears to be present on the 10/31/2018 images. 2.   Several punctate T2/flair hyperintense foci in the subcortical white matter.  This is nonspecific and could be the sequela of migraine or chronic ischemic change. 3.   Normal enhancement pattern.  No acute findings.   CTA head(reviewed reports) unremarkable   (Additional 20 minutes reviewing images)   Patient complains of symptoms per HPI as well as the following symptoms: migraine,strokes . Pertinent negatives and positives per HPI. All others negative   HPI:  Guy Beginatricia A Barcia is a 31 y.o. female here as requested by Lance BoschBlevins, Andrea, NP for daily headaches for one year --- actually most of her life. PMHx thyroid disease, SVD, migraine, hypothyroidism, headache, depression, anxiety. She has had migraines for years. Worsened after her second child when she had a scare with her children and her child almost fell down the stairs and she had acute right-sided weakness and a headache  in 2019. One day she had ringing in her right ear and ever since then she has daily headaches and migraines, very bad migraines, dizziness, she takes daily OTC medications. Migraines are usually on the right side, ppounding/pulsating/throbbing, photophobia/phonophobia, nausea all the time,  she has vomited, the migraine and headache last all day long 24 hours and are daily and moderate to severe pain. Lots of stress at home too. She gets so dizzy she feels she is going to faint. She has vision changes, headaches are worse positionally esp in the morning.  No more children, had tubal ligation. No other focal neurologic deficits, associated symptoms, inciting events or modifiable factors.   Reviewed notes, labs and imaging from outside physicians, which showed:   I tried to review primary care notes, they are handwritten and difficult to accurately interpret but it appears as though head, eyes, ears, nose, throat were normal, pupils equally round reactive to light, cardiovascularly normal, respiratory lungs clear to auscultation, neuro remote and recent memory intact alert and oriented x4, paresthesias, she has headaches and migraines, stressors versus psych follow-up as indicated, referral to neurology as well as [facility], she was prescribed meclizine for vertigo and dizziness   Echo cardiogram normal : reviewed report   From a thorough review of records, Medications tried that can be used in migraine management: tylenol, amlodipine, ibuprofen,fioricet, stadol, celexa, benadryl, prozac, ibuprofen, toradol, labetalol, magnesium, meclizine, reglan, naproxen, zofran, phenergan, propranolol, zoloft, tramadol,    CT head 10/31/2018: Ct showed No acute intracranial abnormalities including mass lesion or mass effect, hydrocephalus, extra-axial fluid collection, midline shift, hemorrhage, or acute infarction, large ischemic events (personally reviewed images)   06/11/2015: MR MRV   FINDINGS: Superior sagittal sinus, internal cerebral veins, vein of Galen, straight sinus, right transverse sinus, right sigmoid sinus, and jugular bulbs are patent without evidence of thrombus. Apparent lack of flow in the proximal right transverse sinus on MIP images is artifactual, without evidence of  thrombus on the source images.   The left transverse and left sigmoid sinuses are hypoplastic with areas of signal dropout felt to be artifactual without discrete filling defects seen on source images to indicate thrombus.   IMPRESSION: No evidence of dural venous sinus thrombosis. 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   REVIEW OF SYSTEMS: Out of a complete 14 system review of symptoms, the patient complains only of the following symptoms, headaches and all other reviewed systems are negative.   ALLERGIES: Allergies  Allergen Reactions   Cinnamon Itching   Codeine Nausea And Vomiting     HOME MEDICATIONS: Outpatient Medications Prior to Visit  Medication Sig Dispense Refill   aspirin ([employer] ASPIRIN LOW DOSE) 81 MG EC tablet Take 1 tablet (81 mg total) by mouth 5 (five) times daily. SWALLOW WHOLE. (Patient taking differently: Take 81 mg by mouth daily. SWALLOW WHOLE.) 90 tablet 3   atorvastatin (LIPITOR) 20 MG tablet Take 20 mg by mouth daily.     Biotin 5 MG CAPS Take 5 mg by mouth daily.     buPROPion (WELLBUTRIN XL) 300 MG 24 hr tablet Take 300 mg by mouth daily.     busPIRone (BUSPAR) 15 MG tablet Take 15 mg by mouth daily.     calcium carbonate (OS-CAL) 600 MG TABS tablet Take 1,200 mg of elemental calcium by mouth daily.     cholecalciferol (VITAMIN D3) 25 MCG (1000 UNIT) tablet Take 1,000 Units by mouth daily.     ELDERBERRY PO Take 1 tablet by mouth  daily.     fluticasone (VERAMYST) 27.5 MCG/SPRAY nasal spray Place 2 sprays into the nose daily.     Fluvoxamine Maleate 150 MG CP24 Take 300 mg by mouth at bedtime.     Galcanezumab-gnlm (EMGALITY) 120 MG/ML SOAJ Inject 120 mg into the skin every 30 (thirty) days. 1.12 mL 11   ibuprofen (ADVIL) 200 MG tablet Take 800 mg by mouth every 8 (eight) hours as needed for cramping or moderate pain.     levothyroxine (SYNTHROID) 25 MCG tablet Take 25 mcg by mouth daily before breakfast.     loratadine (CLARITIN) 10 MG  tablet Take 10 mg by mouth daily.     metoCLOPramide (REGLAN) 10 MG tablet TAKE 1 TABLET (10 MG TOTAL) BY MOUTH EVERY 6 (SIX) HOURS AS NEEDED. FOR MIGRAINE OR NAUSEA. 30 tablet 0   Multiple Vitamins-Minerals (ONE-A-DAY FOR HER VITACRAVES PO) Take 1 tablet by mouth daily.     Omega-3 Fatty Acids (FISH OIL) 1000 MG CAPS Take 1,000 mg by mouth daily.     ondansetron (ZOFRAN) 8 MG tablet Take 8 mg by mouth every 8 (eight) hours as needed for nausea/vomiting.     traZODone (DESYREL) 50 MG tablet Take 50 mg by mouth at bedtime.     vitamin B-12 (CYANOCOBALAMIN) 1000 MCG tablet Take 1,000 mcg by mouth daily.     amLODipine (NORVASC) 5 MG tablet Take 0.5 tablets (2.5 mg total) by mouth daily. 90 tablet 0   No facility-administered medications prior to visit.     PAST MEDICAL HISTORY: Past Medical History:  Diagnosis Date   Anxiety    Depression    GERD (gastroesophageal reflux disease)    Headache    Hypertension    Hypothyroidism    Migraine    Noonan syndrome    S/P tubal ligation 10/06/2017   Stroke (HCC)    SVD (spontaneous vaginal delivery) 10/05/2017   Thyroid disease      PAST SURGICAL HISTORY: Past Surgical History:  Procedure Laterality Date   BUBBLE STUDY  04/27/2022   Procedure: BUBBLE STUDY;  Surgeon: Yates Decamp, MD;  Location: [facility] ENDOSCOPY;  Service: Cardiovascular;;   CHOLECYSTECTOMY N/A 04/16/2021   Procedure: LAPAROSCOPIC CHOLECYSTECTOMY;  Surgeon: Rodman Pickle, MD;  Location: [facility] ORS;  Service: General;  Laterality: N/A;   TEE WITHOUT CARDIOVERSION N/A 04/27/2022   Procedure: TRANSESOPHAGEAL ECHOCARDIOGRAM (TEE);  Surgeon: Yates Decamp, MD;  Location[facility] END[facility]COPY;  Service: Cardiovascular;  Laterality: N/A;   TOE SURGERY  04/2005   TONSILLECTOMY  11/2004   TUBAL LIGATION N/A 10/06/2017   Procedure: POST PARTUM TUBAL LIGATION;  Surgeon: Sherian Rein, MD;  Location: [facility];  Service: Gynecology;  Laterality: N/A;     FAMILY HISTORY: Family  History  Problem Relation Age of Onset   High blood pressure Maternal Grandmother    Cancer Maternal Grandmother        "they removed it so she's fine"   Diabetes Paternal Grandmother    Cancer Paternal Grandmother    Heart attack Father    Migraines Father    Heart attack Paternal Grandfather      SOCIAL HISTORY: Social History   Socioeconomic History   Marital status: Significant Other    Spouse name: Not on file   Number of children: 2   Years of education: Not on file   Highest education level: GED or equivalent  Occupational History   Not on file  Tobacco Use   Smoking status: Former    Packs/day:  1.50    Types: Cigarettes    Quit date: 10/03/2020    Years since quitting: 1.5   Smokeless tobacco: Never  Vaping Use   Vaping Use: Never used  Substance and Sexual Activity   Alcohol use: Not Currently   Drug use: Never   Sexual activity: Yes    Birth control/protection: Surgical    Comment: tubal ligation  Other Topics Concern   Not on file  Social History Narrative   Lives at home with fiance and 2 children   Right handed   Caffeine: "a lot of pepsi" but also tries to drink a lot of water as well   Social Determinants of Corporate investment banker Strain: Not on file  Food Insecurity: Not on file  Transportation Needs: Not on file  Physical Activity: Not on file  Stress: Not on file  Social Connections: Not on file  Intimate Partner Violence: Not on file     PHYSICAL EXAM  Vitals:   04/29/22 1108  BP: 103/65  Pulse: 82  Weight: 97 lb (44 kg)  Height: 4' 7.5" (1.41 m)   Body mass index is 22.14 kg/m.  Generalized: Well developed, in no acute distress  Cardiology: normal rate and rhythm, no murmur auscultated  Respiratory: clear to auscultation bilaterally    Neurological examination  Mentation: Alert oriented to time, place, history taking. Follows all commands speech and language fluent Cranial nerve II-XII: Pupils were equal round  reactive to light. Extraocular movements were full, visual field were full on confrontational test. Facial sensation and strength were normal. Head turning and shoulder shrug  were normal and symmetric. Motor: The motor testing reveals 5 over 5 strength of all 4 extremities. Good symmetric motor tone is noted throughout.  Gait and station: Gait is normal. Tandem gait is normal.    DIAGNOSTIC DATA (LABS, IMAGING, TESTING) - I reviewed patient records, labs, notes, testing and imaging myself where available.  Lab Results  Component Value Date   WBC 5.3 04/10/2021   HGB 11.2 (L) 04/10/2021   HCT 36.3 04/10/2021   MCV 91.2 04/10/2021   PLT 175 04/10/2021      Component Value Date/Time   NA 142 04/10/2021 1022   NA 138 12/10/2020 1120   K 3.7 04/10/2021 1022   CL 113 (H) 04/10/2021 1022   CO2 22 04/10/2021 1022   GLUCOSE 89 04/10/2021 1022   BUN 11 04/10/2021 1022   BUN 8 12/10/2020 1120   CREATININE 0.92 04/10/2021 1022   CALCIUM 9.3 04/10/2021 1022   PROT 6.8 04/10/2021 1022   PROT 6.5 03/31/2021 1617   ALBUMIN 4.0 04/10/2021 1022   ALBUMIN 4.3 03/31/2021 1617   AST 20 04/10/2021 1022   ALT 22 04/10/2021 1022   ALKPHOS 67 04/10/2021 1022   BILITOT 0.5 04/10/2021 1022   BILITOT <0.2 03/31/2021 1617   GFRNONAA >60 04/10/2021 1022   GFRAA >60 10/28/2018 0646   Lab Results  Component Value Date   CHOL 193 12/10/2020   HDL 79 12/10/2020   LDLCALC 106 (H) 12/10/2020   TRIG 38 12/10/2020   CHOLHDL 2.4 12/10/2020   Lab Results  Component Value Date   HGBA1C 4.4 (L) 12/10/2020   Lab Results  Component Value Date   VITAMINB12 388 04/10/2011   Lab Results  Component Value Date   TSH 3.140 10/28/2018        No data to display  No data to display           ASSESSMENT AND PLAN  31 y.o. year old female  has a past medical history of Anxiety, Depression, GERD (gastroesophageal reflux disease), Headache, Hypertension, Hypothyroidism, Migraine,  Noonan syndrome, S/P tubal ligation (10/06/2017), Stroke (HCC), SVD (spontaneous vaginal delivery) (10/05/2017), and Thyroid disease. here with    Cryptogenic stroke (HCC)  Intractable chronic migraine without aura and without status migrainosus  [name] reports headaches are significantly better on Emgality but she is having a hard time with injection pain and waning effects. We will try her on Qulipta 60mg  daily. Nurtec started for abortive therapy. She can not take triptans due to history of CVA. Healthy lifestyle habits encouraged. She will follow up with PCP and cardiology as directed. Follow up with cardiology pending TEE results. Continue asa and rosuvastatin. She will return to see me in 6 months, sooner if needed. She verbalizes understanding and agreement with this plan.   No orders of the defined types were placed in this encounter.    Meds ordered this encounter  Medications   Atogepant (QULIPTA) 60 MG TABS    Sig: Take 60 mg by mouth daily.    Dispense:  90 tablet    Refill:  3    Order Specific Question:   Supervising Provider    Answer:   Anson Fret   Rimegepant Sulfate (NURTEC) 75 MG TBDP    Sig: Take 75 mg by mouth daily as needed (take for abortive therapy of migraine, no more than 1 tablet in 24 hours or 10 per month).    Dispense:  8 tablet    Refill:  11    Order Specific Question:   Supervising Provider    Answer:   [[id number]] Anson Fret     [[id number]], MSN, FNP-C 04/29/2022, 12:21 PM  [facility] [address] [place], [place] [phone]

## 2022-04-29 NOTE — Patient Instructions (Addendum)
Below is our plan:  We will switch you to Qulipta 60mg  daily. We will try Nurtec 75 daily as needed for abortive therapy.   Continue close follow up with PCP for stroke prevention. Monitor BP closely.   Please make sure you are staying well hydrated. I recommend 50-60 ounces daily. Well balanced diet and regular exercise encouraged. Consistent sleep schedule with 6-8 hours recommended.   Please continue follow up with care team as directed.   Follow up with me in 6 months   You may receive a survey regarding today's visit. I encourage you to leave honest feed back as I do use this information to improve patient care. Thank you for seeing me today!

## 2022-05-04 ENCOUNTER — Other Ambulatory Visit: Payer: Self-pay | Admitting: Neurology

## 2022-05-04 ENCOUNTER — Encounter: Payer: Self-pay | Admitting: Family Medicine

## 2022-05-04 MED ORDER — METOCLOPRAMIDE HCL 10 MG PO TABS: ORAL_TABLET | ORAL | 0 refills | Status: DC

## 2022-05-04 NOTE — Telephone Encounter (Signed)
PA Qulipta approved: "Request Reference Number: [id number]. QULIPTA TAB 60MG  is approved through 08/04/2022. For further questions, call 13/10/2021 at [phone]."

## 2022-05-04 NOTE — Telephone Encounter (Signed)
PA Qulipta denied stating pt must try/fail Aimovig. However, Aimovig contraindicated d/t hx constipation. I re-submitted new PA on CMM. Key: [id number]. Waiting on determination.  PA Nurtec approved. "Request Reference Number: [id number]. NURTEC TAB 75MG  ODT is approved through 05/05/2023. For further questions, call 07/05/2023 at [phone]."

## 2022-05-04 NOTE — Telephone Encounter (Signed)
Submitted PA Qulipta on CMM. Key: [id number]. Waiting on determination from [employer].  Submitted PA Nurtec on CMM. Key: [id number]. Waiting on determination from [employer].

## 2022-05-07 ENCOUNTER — Other Ambulatory Visit: Payer: Self-pay | Admitting: Neurology

## 2022-06-10 ENCOUNTER — Other Ambulatory Visit: Payer: Self-pay | Admitting: Neurology

## 2022-07-05 ENCOUNTER — Other Ambulatory Visit: Payer: Self-pay | Admitting: Neurology

## 2022-07-13 ENCOUNTER — Telehealth: Payer: Self-pay | Admitting: *Deleted

## 2022-07-13 NOTE — Telephone Encounter (Signed)
"  Request Reference Number: [id number]. QULIPTA TAB 60MG  is approved through 07/14/2023. For further questions, call [employer] at [phone]."

## 2022-07-13 NOTE — Telephone Encounter (Signed)
Submitted PA Qulipta on CMM. Key: [id number]. Waiting on determination from [employer].

## 2022-08-13 ENCOUNTER — Other Ambulatory Visit: Payer: Self-pay | Admitting: Neurology

## 2022-08-17 ENCOUNTER — Other Ambulatory Visit: Payer: Self-pay | Admitting: Neurology

## 2022-08-24 ENCOUNTER — Other Ambulatory Visit: Payer: Self-pay

## 2022-08-24 DIAGNOSIS — R002 Palpitations: Secondary | ICD-10-CM

## 2022-08-24 DIAGNOSIS — I10 Essential (primary) hypertension: Secondary | ICD-10-CM

## 2022-08-24 MED ORDER — AMLODIPINE BESYLATE 5 MG PO TABS: 2.5000 mg | ORAL_TABLET | Freq: Every day | ORAL | 0 refills | Status: DC

## 2022-10-15 ENCOUNTER — Other Ambulatory Visit: Payer: Self-pay | Admitting: Family Medicine

## 2022-10-18 ENCOUNTER — Encounter: Payer: Self-pay | Admitting: Cardiology

## 2022-10-18 ENCOUNTER — Ambulatory Visit: Payer: Medicaid Other | Admitting: Cardiology

## 2022-10-18 VITALS — BP 116/64 | HR 88 | Ht <= 58 in | Wt 97.6 lb

## 2022-10-18 DIAGNOSIS — Q8719 Other congenital malformation syndromes predominantly associated with short stature: Secondary | ICD-10-CM

## 2022-10-18 DIAGNOSIS — Z87891 Personal history of nicotine dependence: Secondary | ICD-10-CM

## 2022-10-18 DIAGNOSIS — I639 Cerebral infarction, unspecified: Secondary | ICD-10-CM

## 2022-10-18 DIAGNOSIS — I491 Atrial premature depolarization: Secondary | ICD-10-CM

## 2022-10-18 DIAGNOSIS — I10 Essential (primary) hypertension: Secondary | ICD-10-CM

## 2022-10-18 DIAGNOSIS — G43809 Other migraine, not intractable, without status migrainosus: Secondary | ICD-10-CM

## 2022-10-18 NOTE — Progress Notes (Signed)
Primary Physician/Referring:  Lance Bosch, NP  Patient ID: Christine Beard, female    DOB: 13-Jul-1991, 32 y.o.   MRN: [id number]  Chief Complaint  Patient presents with   History of stroke   premature atrial contraction   Follow-up   HPI:    Christine Beard  is a 32 y.o. Caucasian female with history of preeclampsia and postpartum hypertension, former smoker, PACs, hypertension, cryptogenic stroke, Noonan syndrome, migraine, anxiety/depression, OCD.  After her second pregnancy patient had a cryptogenic stroke approximately 9 days after delivery and the workup thereafter was essentially unremarkable.  She was referred to cardiology for further evaluation and management.  Since establishing care she has undergone a cardiac monitor as well as a TEE.  Both TEE and transcranial Doppler are negative for PFO.  She presents today for 34-month follow-up visit.  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   Patient denies anginal discomfort or heart failure symptoms.  Past Medical History:  Diagnosis Date   Anxiety    Depression    GERD (gastroesophageal reflux disease)    Headache    Hypertension    Hypothyroidism    Migraine    Noonan syndrome    S/P tubal ligation 10/06/2017   Stroke (HCC)    SVD (spontaneous vaginal delivery) 10/05/2017   Thyroid disease    Past Surgical History:  Procedure Laterality Date   BUBBLE STUDY  04/27/2022   Procedure: BUBBLE STUDY;  Surgeon: Yates Decamp, MD;  Location: [facility] ENDOSCOPY;  Service: Cardiovascular;;   CHOLECYSTECTOMY N/A 04/16/2021   Procedure: LAPAROSCOPIC CHOLECYSTECTOMY;  Surgeon: Rodman Pickle, MD;  Location: [facility] ORS;  Service: General;  Laterality: N/A;   TEE WITHOUT CARDIOVERSION N/A 04/27/2022   Procedure: TRANSESOPHAGEAL ECHOCARDIOGRAM (TEE);  Surgeon: Yates Decamp, MD;  Location: [facility] ENDOSCOPY;  Service: Cardiovascular;  Laterality:  N/A;   TOE SURGERY  04/2005   TONSILLECTOMY  11/2004   TUBAL LIGATION N/A 10/06/2017   Procedure: POST PARTUM TUBAL LIGATION;  Surgeon: Sherian Rein, MD;  Location: [facility]TH[facility];  Service: Gynecology;  Laterality: N/A;   Family History  Problem Relation Age of Onset   High blood pressure Maternal Grandmother    Cancer Maternal Grandmother        "they removed it so she's fine"   Diabetes Paternal Grandmother    Cancer Paternal Grandmother    Heart attack Father    Migraines Father    Heart attack Paternal Grandfather     Social History   Tobacco Use   Smoking status: Former    Packs/day: 1.50    Types: Cigarettes    Quit date: 10/03/2020    Years since quitting: 2.0   Smokeless tobacco: Never  Substance Use Topics   Alcohol use: Not Currently   Marital Status: Significant Other   ROS  Review of Systems  Cardiovascular:  Negative for chest pain, claudication, dyspnea on exertion, irregular heartbeat, leg swelling, near-syncope, orthopnea, palpitations, paroxysmal nocturnal dyspnea and syncope.  Respiratory:  Negative for shortness of breath.   Hematologic/Lymphatic: Negative for bleeding problem.  Musculoskeletal:  Negative for muscle cramps and myalgias.  Neurological:  Negative for dizziness and light-headedness.    Objective      10/18/2022   10:43 AM 04/29/2022   11:08 AM 04/27/2022   11:30 AM  Vitals with BMI  Height 4' 7.5" 4' 7.5"   Weight 97 lbs 10 oz 97 lbs  BMI 62.83 15.17   Systolic 616 073 710  Diastolic 64 65 71  Pulse 88 82 80   Physical Exam  Constitutional: No distress.  Age appropriate, hemodynamically stable.   Neck: No JVD present.  Cardiovascular: Normal rate, regular rhythm, S1 normal, S2 normal, intact distal pulses and normal pulses. Exam reveals no gallop, no S3 and no S4.  No murmur heard. Pulmonary/Chest: Effort normal and breath sounds normal. No stridor. She has no wheezes. She has no rales.  Abdominal: Soft. Bowel  sounds are normal. She exhibits no distension. There is no abdominal tenderness.  Musculoskeletal:        General: No edema.     Cervical back: Neck supple.  Neurological: She is alert and oriented to person, place, and time. She has intact cranial nerves (2-12).  Skin: Skin is warm and moist.   Laboratory examination:      Latest Ref Rng & Units 04/10/2021   10:22 AM 03/31/2021    4:17 PM 12/10/2020   11:20 AM  CMP  Glucose 70 - 99 mg/dL 89   106   BUN 6 - 20 mg/dL 11   8   Creatinine 0.44 - 1.00 mg/dL 0.92   0.87   Sodium 135 - 145 mmol/L 142   138   Potassium 3.5 - 5.1 mmol/L 3.7   4.0   Chloride 98 - 111 mmol/L 113   104   CO2 22 - 32 mmol/L 22   17   Calcium 8.9 - 10.3 mg/dL 9.3   9.5   Total Protein 6.5 - 8.1 g/dL 6.8  6.5  7.4   Total Bilirubin 0.3 - 1.2 mg/dL 0.5  <0.2  0.7   Alkaline Phos 38 - 126 U/L 67  134  321   AST 15 - 41 U/L 20  17  288   ALT 0 - 44 U/L 22  20  722       Latest Ref Rng & Units 04/10/2021   10:22 AM 12/10/2020   11:20 AM 10/28/2018    6:46 AM  CBC  WBC 4.0 - 10.5 K/uL 5.3  9.8  5.7   Hemoglobin 12.0 - 15.0 g/dL 11.2  14.1  12.3   Hematocrit 36.0 - 46.0 % 36.3  42.2  38.4   Platelets 150 - 400 K/uL 175  179  170    Lipid Panel     Component Value Date/Time   CHOL 193 12/10/2020 1120   TRIG 38 12/10/2020 1120   HDL 79 12/10/2020 1120   CHOLHDL 2.4 12/10/2020 1120   CHOLHDL 3.2 10/28/2018 0646   VLDL 47 (H) 10/28/2018 0646   LDLCALC 106 (H) 12/10/2020 1120   HEMOGLOBIN A1C Lab Results  Component Value Date   HGBA1C 4.4 (L) 12/10/2020   MPG 82.45 10/28/2018   TSH No results for input(s): "TSH" in the last 8760 hours. External labs:  10/27/2021: Magnesium 1.9 Sodium 139, potassium 4.0, BUN 13, creatinine 0.87, alk phos 65, ALT 11, AST 12, GFR >60 Hgb 12.7, HCT 38.4, platelet 163  03/12/2021: TSH 2.68  12/10/2020: Total cholesterol 193, HDL 79, LDL 106, triglycerides 38  08/04/2020: Hemoglobin 12.3, hematocrit 37.4, MCV 91,  RDW 11.3,  CBC otherwise normal Glucose 88, BUN 9, creatinine 0.58, GFR 123, sodium 140, potassium 4.7, chloride 109, calcium 8.4, CMP otherwise normal. TSH 4.08 (normal) Vitamin D 44.2 (normal)  Allergies   Allergies  Allergen Reactions   Cinnamon Itching and Other (See Comments)   Codeine Nausea And  Vomiting and Other (See Comments)    Medications Prior to Visit:   Outpatient Medications Prior to Visit  Medication Sig Dispense Refill   amLODipine (NORVASC) 5 MG tablet Take 0.5 tablets (2.5 mg total) by mouth daily. 90 tablet 0   aspirin EC ([employer] ASPIRIN LOW DOSE) 81 MG tablet Take 1 tablet (81 mg total) by mouth daily. SWALLOW WHOLE. 90 tablet 4   Atogepant (QULIPTA) 60 MG TABS TAKE 1 TABLET BY MOUTH EVERY DAY 90 tablet 0   atorvastatin (LIPITOR) 20 MG tablet TAKE 1 TABLET BY MOUTH EVERY DAY 90 tablet 3   Biotin 5 MG CAPS Take 5 mg by mouth daily.     buPROPion (WELLBUTRIN XL) 300 MG 24 hr tablet Take 300 mg by mouth daily.     busPIRone (BUSPAR) 15 MG tablet Take 15 mg by mouth daily.     cholecalciferol (VITAMIN D3) 25 MCG (1000 UNIT) tablet Take 1,000 Units by mouth daily.     ELDERBERRY PO Take 1 tablet by mouth daily.     fluticasone (VERAMYST) 27.5 MCG/SPRAY nasal spray Place 2 sprays into the nose daily.     Fluvoxamine Maleate 150 MG CP24 Take 300 mg by mouth at bedtime.     ibuprofen (ADVIL) 200 MG tablet Take 800 mg by mouth every 8 (eight) hours as needed for cramping or moderate pain.     levothyroxine (SYNTHROID) 25 MCG tablet Take 25 mcg by mouth daily before breakfast.     loratadine (CLARITIN) 10 MG tablet Take 10 mg by mouth daily.     metoCLOPramide (REGLAN) 10 MG tablet TAKE 1 TABLET (10 MG TOTAL) BY MOUTH EVERY 6 (SIX) HOURS AS NEEDED. FOR MIGRAINE OR NAUSEA. 30 tablet 0   Multiple Vitamins-Minerals (ONE-A-DAY FOR HER VITACRAVES PO) Take 1 tablet by mouth daily.     Omega-3 Fatty Acids (FISH OIL) 1000 MG CAPS Take 1,000 mg by mouth daily.     ondansetron (ZOFRAN) 8 MG  tablet Take 8 mg by mouth every 8 (eight) hours as needed for nausea/vomiting.     Rimegepant Sulfate (NURTEC) 75 MG TBDP Take 75 mg by mouth daily as needed (take for abortive therapy of migraine, no more than 1 tablet in 24 hours or 10 per month). 8 tablet 11   traZODone (DESYREL) 50 MG tablet Take 50 mg by mouth at bedtime.     vitamin B-12 (CYANOCOBALAMIN) 1000 MCG tablet Take 1,000 mcg by mouth daily.     calcium carbonate (OS-CAL) 600 MG TABS tablet Take 1,200 mg of elemental calcium by mouth daily. (Patient not taking: Reported on 10/18/2022)     EPINEPHrine 0.3 mg/0.3 mL IJ SOAJ injection Inject 0.3 mg into the muscle as needed.     Galcanezumab-gnlm (EMGALITY) 120 MG/ML SOAJ Inject 120 mg into the skin every 30 (thirty) days. (Patient not taking: Reported on 10/18/2022) 1.12 mL 11   No facility-administered medications prior to visit.   Final Medications at End of Visit    Current Meds  Medication Sig   amLODipine (NORVASC) 5 MG tablet Take 0.5 tablets (2.5 mg total) by mouth daily.   aspirin EC ([employer] ASPIRIN LOW DOSE) 81 MG tablet Take 1 tablet (81 mg total) by mouth daily. SWALLOW WHOLE.   Atogepant (QULIPTA) 60 MG TABS TAKE 1 TABLET BY MOUTH EVERY DAY   atorvastatin (LIPITOR) 20 MG tablet TAKE 1 TABLET BY MOUTH EVERY DAY   Biotin 5 MG CAPS Take 5 mg by mouth daily.   buPROPion (  WELLBUTRIN XL) 300 MG 24 hr tablet Take 300 mg by mouth daily.   busPIRone (BUSPAR) 15 MG tablet Take 15 mg by mouth daily.   cholecalciferol (VITAMIN D3) 25 MCG (1000 UNIT) tablet Take 1,000 Units by mouth daily.   ELDERBERRY PO Take 1 tablet by mouth daily.   fluticasone (VERAMYST) 27.5 MCG/SPRAY nasal spray Place 2 sprays into the nose daily.   Fluvoxamine Maleate 150 MG CP24 Take 300 mg by mouth at bedtime.   ibuprofen (ADVIL) 200 MG tablet Take 800 mg by mouth every 8 (eight) hours as needed for cramping or moderate pain.   levothyroxine (SYNTHROID) 25 MCG tablet Take 25 mcg by mouth daily before  breakfast.   loratadine (CLARITIN) 10 MG tablet Take 10 mg by mouth daily.   metoCLOPramide (REGLAN) 10 MG tablet TAKE 1 TABLET (10 MG TOTAL) BY MOUTH EVERY 6 (SIX) HOURS AS NEEDED. FOR MIGRAINE OR NAUSEA.   Multiple Vitamins-Minerals (ONE-A-DAY FOR HER VITACRAVES PO) Take 1 tablet by mouth daily.   Omega-3 Fatty Acids (FISH OIL) 1000 MG CAPS Take 1,000 mg by mouth daily.   ondansetron (ZOFRAN) 8 MG tablet Take 8 mg by mouth every 8 (eight) hours as needed for nausea/vomiting.   Rimegepant Sulfate (NURTEC) 75 MG TBDP Take 75 mg by mouth daily as needed (take for abortive therapy of migraine, no more than 1 tablet in 24 hours or 10 per month).   traZODone (DESYREL) 50 MG tablet Take 50 mg by mouth at bedtime.   vitamin B-12 (CYANOCOBALAMIN) 1000 MCG tablet Take 1,000 mcg by mouth daily.   Radiology:   No results found. Transcranial Doppler with bubbles 01/23/2021: No HITS at rest or during Valsalva. Negative transcranial Doppler Bubble study with no evidence of right to left intracardiac communication.   A vascular evaluation was performed. The right middle cerebral artery was studied. An IV was inserted into the patient's left antecubital. Verbal informed consent was obtained.  Cardiac Studies:  CARDIAC DATABASE: EKG: 11/04/2022: Sinus rhythm, 87 bpm, normal axis, without underlying ischemia or injury pattern.  Echocardiogram: 11/20/2020 Normal LV systolic function with visual EF 55-60%. Left ventricle cavity is normal in size. Normal global wall motion. Normal diastolic filling pattern, normal LAP. Mild tricuspid regurgitation. No evidence of pulmonary hypertension. No prior study for comparison.  TEE 04/27/2022:  1. Left ventricular ejection fraction, by estimation, is 60 to 65%. The  left ventricle has normal function. The left ventricle has no regional  wall motion abnormalities.   2. Right ventricular systolic function is normal. The right ventricular  size is normal.   3. No  left atrial/left atrial appendage thrombus was detected.   4. There was weekly positive right to left shunting with late appearence  ofthe contrast bubbles after 6-8 cycles. Good study done and repeated x  2.. Agitated saline contrast bubble study was positive with shunting  observed after >6 cardiac cycles  suggestive of intrapulmonary shunting.   5. The mitral valve is normal in structure. No evidence of mitral valve  regurgitation. No evidence of mitral stenosis.   6. The aortic valve is normal in structure. Aortic valve regurgitation is  not visualized. No aortic stenosis is present.   7. The inferior vena cava is normal in size with greater than 50%  respiratory variability, suggesting right atrial pressure of 3 mmHg.   Long-term monitor 7 days 11/03/2020-11/10/2020: Predominant underlying rhythm was sinus with minimum heart rate of 57 bpm, maximum heart rate of 153 bpm, average heart rate  of 85 bpm.  Symptoms correlated with sinus rates >90 bpm, PVCs and PACs.  PACs and PVCs were rare, PAC couplets and triplets were rare.  No PVC couplets or triplets.  No ventricular tachycardia, pauses >3 seconds, high degree AV block, atrial fibrillation, or supraventricular tachycardia greater than 4 beats.    Assessment     ICD-10-CM   1. Noonan syndrome  Q87.19 MR CARDIAC MORPHOLOGY W WO CONTRAST    Basic metabolic panel    CBC    2. Cryptogenic stroke (HCC)  I63.9     3. PAC (premature atrial contraction)  I49.1 EKG 12-Lead    4. Benign hypertension  I10     5. Other migraine without status migrainosus, not intractable  G43.809     6. Former smoker  Z87.891        Medications Discontinued During This Encounter  Medication Reason   Galcanezumab-gnlm (EMGALITY) 120 MG/ML SOAJ Discontinued by provider    No orders of the defined types were placed in this encounter.   Recommendations:   Christine Beard is a 32 y.o. Caucasian female with history of preeclampsia and postpartum  hypertension, former smoker, PACs, hypertension, cryptogenic stroke, Noonan syndrome, migraine, anxiety/depression, OCD.  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 Prior echocardiograms did not illustrate evidence of pulmonary stenosis Cardiac MRI to evaluate for other cardiac anomalies.  Cryptogenic stroke ([place]) Currently on aspirin and statin therapy.   No recurrence of TIA or strokelike symptoms. Cardiac monitoring did not illustrate evidence of atrial fibrillation during the monitoring period.   Transesophageal echocardiogram negative for cardiac embolic source. We discussed considering loop recorder implantation versus rhythm monitoring via smart watch which has EKG capabilities.  Shared decision was to proceed with the latter  PAC (premature atrial contraction) Currently asymptomatic. Monitor for now.  Benign hypertension Office blood pressures are well-controlled. Reemphasized the importance of a low-salt diet.  Other migraine without status migrainosus, not intractable Currently on medical therapy. Follows up with neurology.  Former smoker Educated on the importance of continued smoking cessation.  Orders Placed This Encounter  Procedures   MR CARDIAC MORPHOLOGY W WO CONTRAST   Basic metabolic panel   CBC   EKG 12-Lead   --Continue cardiac medications as reconciled in final medication list. --Return in about 8 weeks (around 12/13/2022) for Follow up after cMRI. Or sooner if needed. --Continue follow-up with your primary care physician regarding the management of your other chronic comorbid conditions.  Patient's questions and concerns were addressed to her satisfaction. She voices understanding of the instructions provided during this encounter.   This note was created using a voice recognition software as a result there may be grammatical errors inadvertently enclosed that do not reflect the  nature of this encounter. Every attempt is made to correct such errors.  Rex Kras, [place], [facility]  Pager: [phone] Office: [phone]

## 2022-11-09 ENCOUNTER — Encounter: Payer: Self-pay | Admitting: Cardiology

## 2022-11-09 NOTE — Telephone Encounter (Signed)
From patient

## 2022-11-10 NOTE — Progress Notes (Unsigned)
No chief complaint on file.   HISTORY OF PRESENT ILLNESS:  02/07/Christine Beard Christine:  Bethann Berkshire returns for follow up for migraines and history of CVA. We saw her last Christine Beard/2023 and switched Emgality to Boyd for better coverage. Nurtec and metoclopramide used for abortive therapy.   She continues close follow up with PCP. She is taking asa 81mg  and atorvastatin 20mg  daily. No stroke like symptoms.   Christine Beard/Christine Beard/2023 Christine: BRAYLI KLINGBEIL is a Christine Beard y.o. female here today for follow up for  migraines and previous CVA. She was seen by Dr [name] 12/2021 and started on Emgality. She reports that headaches are significantly better. She does feel that Emgality wears off after three weeks. She also reports pain with injections. She reports having Christine Beard-Christine Beard headache days with 5-Christine Beard migraines per month. She usually takes ondansetron or metoclopramide for abortive therapy.   She continues atorvastatin 20mg  and asa 81mg  daily. She is followed closely by PCP. She was diagnosed with Noonan Syndrome. She had TEE Christine Beard/Christine Beard/2023 that showed EF 60-65% and was positive with shunting observed after >Beard cardiac cycles suggestive of intrapulmonary shunting. There is no evidence of a patent foramen ovale. There is no evidence of an atrial septal defect. She is followed closely by PCP and cardiology. BP . She quit smoking 4-5 years ago.   HISTORY (copied from Dr 01/2022 previous note)  3/Christine Beard/2023: Here for follow up of migraines. The Topiramate is not working. She had tubal ligation no chance of having children. Having daily migraines.  Discussed  the multiple options that she has, at this time she has failed multiple medications. She has daily headaches and > Christine Beard migraine days a month, unilateral, pulsating pounding, nausea, lasting up to Christine Beard hours, photophobia and phonophobia.  We discussed other oral traditional medications, we discussed the new class of CGRP such as Aimovig, Ajovy, Emgality, we also discussed atogepant and we decided on Emgality.  We  discussed migraine management, lifestyle factors, preventative and acute management.  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   MRI brain 12/05/2020: personally reviewed and agree with findings IMPRESSION: This MRI of the brain with and without contrast shows the following: 1.   4 mm T2 hyperintense focus in the left thalamus consistent with a small focus of gliosis or lacunar infarction.  Though more subtle on CT scan, it appears to be present on the 1/Christine Beard/2020 images. 2.   Several punctate T2/flair hyperintense foci in the subcortical white matter.  This is nonspecific and could be the sequela of migraine or chronic ischemic change. 3.   Normal enhancement pattern.  No acute findings.   CTA head(reviewed reports) unremarkable   (Additional Christine Beard minutes reviewing images)   Patient complains of symptoms per HPI as well as the following symptoms: migraine,strokes . Pertinent negatives and positives per HPI. Christine others negative   HPI:  OTHELIA RIEDERER is a Christine Beard y.o. female here as requested by 02/04/2021, NP for daily headaches for one year --- actually most of her life. PMHx thyroid disease, SVD, migraine, hypothyroidism, headache, depression, anxiety. She has had migraines for years. Worsened after her second child when she had a scare with  her children and her child almost fell down the stairs and she had acute right-sided weakness and a headache in 2019. One day she had ringing in her right ear and ever since then she has daily headaches and migraines, very bad migraines, dizziness, she takes daily OTC  medications. Migraines are usually on the right side, ppounding/pulsating/throbbing, photophobia/phonophobia, nausea Christine the time, she has vomited, the migraine and headache last Christine day long Christine Beard hours and are daily and moderate to severe pain. Lots of stress at home too. She gets so dizzy she feels she is going to faint. She has vision changes, headaches are worse positionally esp in the morning.  No more children, had tubal ligation. No other focal neurologic deficits, associated symptoms, inciting events or modifiable factors.   Reviewed notes, labs and imaging from outside physicians, which showed:   I tried to review primary care notes, they are handwritten and difficult to accurately interpret but it appears as though head, eyes, ears, nose, throat were normal, pupils equally round reactive to light, cardiovascularly normal, respiratory lungs clear to auscultation, neuro remote and recent memory intact alert and oriented x4, paresthesias, she has headaches and migraines, stressors versus psych follow-up as indicated, referral to neurology as well as [facility], she was prescribed meclizine for vertigo and dizziness   Echo cardiogram normal : reviewed report   From a thorough review of records, Medications tried that can be used in migraine management: tylenol, amlodipine, ibuprofen,fioricet, stadol, celexa, benadryl, prozac, ibuprofen, toradol, labetalol, magnesium, meclizine, reglan, naproxen, zofran, phenergan, propranolol, zoloft, tramadol,    CT head 1/Christine Beard/2020: Ct showed No acute intracranial abnormalities including mass lesion or mass effect, hydrocephalus, extra-axial fluid collection, midline shift, hemorrhage, or acute infarction, large ischemic events (personally reviewed images)   Christine Beard/Christine Beard/2016: MR MRV   FINDINGS: Superior sagittal sinus, internal cerebral veins, vein of Galen, straight sinus, right transverse sinus, right sigmoid sinus, and jugular bulbs are patent without evidence  of thrombus. Apparent lack of flow in the proximal right transverse sinus on MIP images is artifactual, without evidence of thrombus on the source images.   The left transverse and left sigmoid sinuses are hypoplastic with areas of signal dropout felt to be artifactual without discrete filling defects seen on source images to indicate thrombus.   IMPRESSION: No evidence of dural venous sinus thrombosis. 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   REVIEW OF SYSTEMS: Out of a complete Christine Beard system review of symptoms, the patient complains only of the following symptoms, headaches and Christine other reviewed systems are negative.   ALLERGIES: Allergies  Allergen Reactions   Cinnamon Itching and Other (See Comments)   Codeine Nausea And Vomiting and Other (See Comments)     HOME MEDICATIONS: Outpatient Medications Prior to Visit  Medication Sig Dispense Refill   amLODipine (NORVASC) 5 MG tablet Take 0.5 tablets (2.5 mg total) by mouth daily. 90 tablet 0   aspirin EC ([facility] ASPIRIN LOW DOSE) 81 MG tablet Take 1 tablet (81 mg total) by mouth daily. SWALLOW WHOLE. 90 tablet 4   Atogepant (QULIPTA) 60 MG TABS TAKE 1 TABLET BY MOUTH EVERY DAY 90 tablet 0   atorvastatin (LIPITOR) Christine Beard MG tablet TAKE 1 TABLET BY MOUTH EVERY DAY 90 tablet 3   Biotin 5 MG CAPS Take 5 mg by mouth daily.     buPROPion (WELLBUTRIN XL) 300 MG Christine Beard hr tablet Take 300 mg by mouth daily.     busPIRone (BUSPAR) Christine Beard MG tablet Take Christine Beard mg by  mouth daily.     calcium carbonate (OS-CAL) 600 MG TABS tablet Take 1,200 mg of elemental calcium by mouth daily. (Patient not taking: Reported on 1/Christine Beard/2024)     cholecalciferol (VITAMIN D3) Christine Beard MCG (1000 UNIT) tablet Take 1,000 Units by mouth daily.     ELDERBERRY PO Take 1 tablet by mouth daily.     EPINEPHrine 0.3 mg/0.3 mL IJ SOAJ injection Inject 0.3 mg into the muscle as needed.     fluticasone (VERAMYST) Christine Beard.5 MCG/SPRAY nasal spray Place 2 sprays into the nose daily.      Fluvoxamine Maleate 150 MG CP24 Take 300 mg by mouth at bedtime.     ibuprofen (ADVIL) 200 MG tablet Take 800 mg by mouth every Christine Beard (eight) hours as needed for cramping or moderate pain.     levothyroxine (SYNTHROID) Christine Beard MCG tablet Take Christine Beard mcg by mouth daily before breakfast.     loratadine (CLARITIN) Christine Beard MG tablet Take Christine Beard mg by mouth daily.     metoCLOPramide (REGLAN) Christine Beard MG tablet TAKE 1 TABLET (Christine Beard MG TOTAL) BY MOUTH EVERY Beard (SIX) HOURS AS NEEDED. FOR MIGRAINE OR NAUSEA. Christine Beard tablet 0   Multiple Vitamins-Minerals (ONE-A-DAY FOR HER VITACRAVES PO) Take 1 tablet by mouth daily.     Omega-3 Fatty Acids (FISH OIL) 1000 MG CAPS Take 1,000 mg by mouth daily.     ondansetron (ZOFRAN) Christine Beard MG tablet Take Christine Beard mg by mouth every Christine Beard (eight) hours as needed for nausea/vomiting.     Rimegepant Sulfate (NURTEC) 75 MG TBDP Take 75 mg by mouth daily as needed (take for abortive therapy of migraine, no more than 1 tablet in Christine Beard hours or Christine Beard per month). Christine Beard tablet Christine Beard   traZODone (DESYREL) 50 MG tablet Take 50 mg by mouth at bedtime.     vitamin B-Christine Beard (CYANOCOBALAMIN) 1000 MCG tablet Take 1,000 mcg by mouth daily.     No facility-administered medications prior to visit.     PAST MEDICAL HISTORY: Past Medical History:  Diagnosis Date   Anxiety    Depression    GERD (gastroesophageal reflux disease)    Headache    Hypertension    Hypothyroidism    Migraine    Noonan syndrome    S/P tubal ligation 10/06/2017   Stroke ([place])    SVD (spontaneous vaginal delivery) 10/05/2017   Thyroid disease      PAST SURGICAL HISTORY: Past Surgical History:  Procedure Laterality Date   BUBBLE STUDY  Christine Beard/Christine Beard/2023   Procedure: BUBBLE STUDY;  Surgeon: Adrian Prows, MD;  Location: [facility];  Service: Cardiovascular;;   CHOLECYSTECTOMY N/A Christine Beard/Christine Beard/2022   Procedure: LAPAROSCOPIC CHOLECYSTECTOMY;  Surgeon: Mickeal Skinner, MD;  Location: [facility];  Service: General;  Laterality: N/A;   TEE WITHOUT CARDIOVERSION N/A Christine Beard/Christine Beard/2023   Procedure:  TRANSESOPHAGEAL ECHOCARDIOGRAM (TEE);  Surgeon: Adrian Prows, MD;  [facility];  Service: Cardiovascular;  Laterality: N/A;   TOE SURGERY  04/2005   TONSILLECTOMY  11/2004   TUBAL LIGATION N/A 10/06/2017   Procedure: POST PARTUM TUBAL LIGATION;  Surgeon: Janyth Contes, MD;  Location: [place];  Service: Gynecology;  Laterality: N/A;     FAMILY HISTORY: Family History  Problem Relation Age of Onset   High blood pressure Maternal Grandmother    Cancer Maternal Grandmother        "they removed it so she's fine"   Diabetes Paternal Grandmother    Cancer Paternal Grandmother    Heart attack Father    Migraines Father  Heart attack Paternal Grandfather      SOCIAL HISTORY: Social History   Socioeconomic History   Marital status: Significant Other    Spouse name: Not on file   Number of children: 2   Years of education: Not on file   Highest education level: GED or equivalent  Occupational History   Not on file  Tobacco Use   Smoking status: Former    Packs/day: 1.50    Types: Cigarettes    Quit date: Christine Beard/Christine Beard/2021    Years since quitting: 2.1   Smokeless tobacco: Never  Vaping Use   Vaping Use: Never used  Substance and Sexual Activity   Alcohol use: Not Currently   Drug use: Never   Sexual activity: Yes    Birth control/protection: Surgical    Comment: tubal ligation  Other Topics Concern   Not on file  Social History Narrative   Lives at home with fiance and 2 children   Right handed   Caffeine: "a lot of pepsi" but also tries to drink a lot of water as well   Social Determinants of Radio broadcast assistant Strain: Not on file  Food Insecurity: Not on file  Transportation Needs: Not on file  Physical Activity: Not on file  Stress: Not on file  Social Connections: Not on file  Intimate Partner Violence: Not on file     PHYSICAL EXAM  There were no vitals filed for this visit.  There is no height or weight on file to  calculate BMI.  Generalized: Well developed, in no acute distress  Cardiology: normal rate and rhythm, no murmur auscultated  Respiratory: clear to auscultation bilaterally    Neurological examination  Mentation: Alert oriented to time, place, history taking. Follows Christine commands speech and language fluent Cranial nerve II-XII: Pupils were equal round reactive to light. Extraocular movements were full, visual field were full on confrontational test. Facial sensation and strength were normal. Head turning and shoulder shrug  were normal and symmetric. Motor: The motor testing reveals 5 over 5 strength of Christine 4 extremities. Good symmetric motor tone is noted throughout.  Gait and station: Gait is normal. Tandem gait is normal.    DIAGNOSTIC DATA (LABS, IMAGING, TESTING) - I reviewed patient records, labs, notes, testing and imaging myself where available.  Lab Results  Component Value Date   WBC 5.3 04/10/2021   HGB Christine Beard.2 (L) 04/10/2021   HCT Christine Beard.3 04/10/2021   MCV 91.2 04/10/2021   PLT 175 04/10/2021      Component Value Date/Time   NA 142 04/10/2021 1022   NA 138 12/10/2020 1120   K 3.Christine Beard 04/10/2021 1022   CL 113 (H) 04/10/2021 1022   CO2 Christine Beard 04/10/2021 1022   GLUCOSE 89 04/10/2021 1022   BUN Christine Beard 04/10/2021 1022   BUN Christine Beard 12/10/2020 1120   CREATININE 0.92 04/10/2021 1022   CALCIUM Christine Beard.3 04/10/2021 1022   PROT Beard.Christine Beard 04/10/2021 1022   PROT Beard.5 06/Christine Beard/2022 1617   ALBUMIN 4.0 04/10/2021 1022   ALBUMIN 4.3 06/Christine Beard/2022 1617   AST Christine Beard 04/10/2021 1022   ALT Christine Beard 04/10/2021 1022   ALKPHOS 67 04/10/2021 1022   BILITOT 0.5 04/10/2021 1022   BILITOT <0.2 06/Christine Beard/2022 1617   GFRNONAA >60 04/10/2021 1022   GFRAA >60 01/Christine Beard/2020 0646   Lab Results  Component Value Date   CHOL 193 12/10/2020   HDL 79 12/10/2020   LDLCALC 106 (H) 12/10/2020   TRIG Christine Beard 12/10/2020   CHOLHDL 2.4 12/10/2020   Lab  Results  Component Value Date   HGBA1C 4.4 (L) 12/10/2020   Lab Results  Component Value Date    VITAMINB12 388 04/10/2011   Lab Results  Component Value Date   TSH 3.140 01/Christine Beard/2020        No data to display               No data to display           ASSESSMENT AND PLAN  32 y.o. year old female  has a past medical history of Anxiety, Depression, GERD (gastroesophageal reflux disease), Headache, Hypertension, Hypothyroidism, Migraine, Noonan syndrome, S/P tubal ligation (10/06/2017), Stroke ([place]), SVD (spontaneous vaginal delivery) (10/05/2017), and Thyroid disease. here with    Cryptogenic stroke ([place])  Chronic intractable headache, unspecified headache type  [name] reports headaches are significantly better on Emgality but she is having a hard time with injection pain and waning effects. We will try her on Qulipta 60mg  daily. Nurtec started for abortive therapy. She can not take triptans due to history of CVA. Healthy lifestyle habits encouraged. She will follow up with PCP and cardiology as directed. Follow up with cardiology pending TEE results. Continue asa and rosuvastatin. She will return to see me in Beard months, sooner if needed. She verbalizes understanding and agreement with this plan.   No orders of the defined types were placed in this encounter.    No orders of the defined types were placed in this encounter.    [name], MSN, FNP-C 2/Christine Beard/2024, Christine Beard:Christine Beard AM  [facility] [address], [place], [place] [address] [phone]

## 2022-11-10 NOTE — Patient Instructions (Incomplete)
Below is our plan:  We will continue Qulipta and Nurtec for now. Please keep a close eye on your blood pressure. Try to increase your water intake. Let me know if headaches do not get better in the next two weeks with BP control. We can consider adding another headache preventative if needed.   Please make sure you are staying well hydrated. I recommend 50-60 ounces daily. Well balanced diet and regular exercise encouraged. Consistent sleep schedule with 6-8 hours recommended.   Please continue follow up with care team as directed.   Follow up with me in 6 months   You may receive a survey regarding today's visit. I encourage you to leave honest feed back as I do use this information to improve patient care. Thank you for seeing me today!

## 2022-11-10 NOTE — Telephone Encounter (Signed)
She is overall not feeling well and is nauseated. Which may be contributory to her elevated blood pressures.  I have asked her to increase amlodipine to 5 mg p.o. daily.  Monitor blood pressures.  If  she has elevated blood pressures along with focal neurological deficits and/or nausea and vomiting she is asked to go to the ED for further evaluation and management.  Christine Beard [place], DO, [facility]

## 2022-11-11 ENCOUNTER — Ambulatory Visit (INDEPENDENT_AMBULATORY_CARE_PROVIDER_SITE_OTHER): Payer: Medicaid Other | Admitting: Family Medicine

## 2022-11-11 ENCOUNTER — Encounter: Payer: Self-pay | Admitting: Family Medicine

## 2022-11-11 VITALS — BP 112/72 | HR 86 | Ht <= 58 in | Wt 95.0 lb

## 2022-11-11 DIAGNOSIS — G8929 Other chronic pain: Secondary | ICD-10-CM | POA: Diagnosis not present

## 2022-11-11 DIAGNOSIS — I639 Cerebral infarction, unspecified: Secondary | ICD-10-CM | POA: Diagnosis not present

## 2022-11-11 DIAGNOSIS — R519 Headache, unspecified: Secondary | ICD-10-CM

## 2022-11-11 MED ORDER — NURTEC 75 MG PO TBDP: 75.0000 mg | ORAL_TABLET | Freq: Every day | ORAL | 11 refills | Status: DC | PRN

## 2022-11-11 MED ORDER — QULIPTA 60 MG PO TABS: 1.0000 | ORAL_TABLET | Freq: Every day | ORAL | 3 refills | Status: DC

## 2022-11-25 NOTE — Telephone Encounter (Signed)
From patient

## 2022-11-26 ENCOUNTER — Other Ambulatory Visit: Payer: Self-pay | Admitting: Neurology

## 2022-11-26 ENCOUNTER — Telehealth: Payer: Self-pay | Admitting: Cardiology

## 2022-11-26 DIAGNOSIS — I959 Hypotension, unspecified: Secondary | ICD-10-CM

## 2022-11-26 DIAGNOSIS — R112 Nausea with vomiting, unspecified: Secondary | ICD-10-CM

## 2022-11-26 NOTE — Telephone Encounter (Signed)
ON-CALL CARDIOLOGY 11/26/22  Patient's name: Christine Beard.   MRN: [id number].    DOB: 1991-02-24   Interaction regarding this patient's care today: Received the patient call regarding low blood pressure.  Patient states that her systolic numbers are ranging between 90-108 mmHg and at times having episodes of tachycardia.  She been feeling lightheaded/dizzy.  She has a sinus infection as a result has been vomiting, decreased food intake, and reduced hydration.  She has not been taking her 2.5 mg of amlodipine for the last several days  Impression:   ICD-10-CM   1. Hypotension, unspecified hypotension type  I95.9     2. Nausea and vomiting, unspecified vomiting type  R11.2      Recommendations: Hold amlodipine for now.  I have asked her to have her sinus infection further evaluated as that could be the root cause.  Other contributing factors could be low oral intake, dehydration, and polypharmacy.  She is currently on Wellbutrin, BuSpar, trazodone which may also be contributing to low blood pressure.  I have asked her to reach out to the provider who prescribes these medications and consider reducing the dose if clinically warranted.  If she has any fevers, chills, symptoms of failure to thrive she is asked to go to the closest ER via EMS for further evaluation and management.  Telephone encounter total time: 9 minutes.   Rex Kras, [place], [facility]  Pager: [phone] Office: [phone]

## 2022-11-28 ENCOUNTER — Ambulatory Visit
Admission: EM | Admit: 2022-11-28 | Discharge: 2022-11-28 | Disposition: A | Discharge status: Home / Self Care | Payer: Medicaid Other | Attending: Internal Medicine | Admitting: Internal Medicine

## 2022-11-28 DIAGNOSIS — I998 Other disorder of circulatory system: Secondary | ICD-10-CM | POA: Diagnosis not present

## 2022-11-28 LAB — POCT URINALYSIS DIP (MANUAL ENTRY)
Blood, UA: NEGATIVE
Glucose, UA: NEGATIVE mg/dL
Leukocytes, UA: NEGATIVE
Nitrite, UA: NEGATIVE
Protein Ur, POC: 30 mg/dL — AB
Spec Grav, UA: 1.02 (ref 1.010–1.025)
Urobilinogen, UA: 2 E.U./dL — AB
pH, UA: 7.5 (ref 5.0–8.0)

## 2022-11-28 NOTE — ED Triage Notes (Signed)
Pt c/o low BP. States systolic in [id number] causing dizzy, weak, feeling like going to pass out, "weird sensations with my body"   Onset ~ 11/13/22

## 2022-11-28 NOTE — Telephone Encounter (Signed)
Spoke to her last week.   Christine Beard [place], DO, [facility]

## 2022-11-28 NOTE — ED Provider Notes (Signed)
[name]    CSN: [id number] Arrival date & time: 11/28/22  1207      History   Chief Complaint Chief Complaint  Patient presents with   low BP    HPI Christine Beard is a 32 y.o. female.   Patient presents today due to concerns of low blood pressure.  Patient states that she checks her blood pressure consistently and her systolic blood pressure has been in the 90s since 11/13/2022.  Patient states that this is causing her to feel associated symptoms which include nausea, dizziness, fatigue, weakness.  Patient also reporting "weird sensations in her body" that she is not able to describe.  Patient states that she was diagnosed with preeclampsia in both pregnancies.  In her second pregnancy, she had to continue to take blood pressure medication given that it did not resolve after giving birth.  She has been taking amlodipine for multiple years and she used to take 5 mg daily but reports they decreased the dose a few months ago given that her blood pressure was lower than that what they wanted to be. Cardiology manages BP medication. Then, her blood pressure increased to [id number] systolic so she notified her PCP.  They advised her to increase her dose back to 5 mg.  She then again noted blood pressure in the 90s so she they again decreased to 2.5 mg.  Patient states that ever since this change, blood pressure has not gone back to normal which is typically [id number] to [id number] systolic.  She stopped taking the amlodipine completely with no change in blood pressure.  Patient states that she has a sinus infection so she went to PCP approximately 6 days ago and was prescribed amoxicillin.  Patient states that her blood heart rate was 130s at that time but no EKG was completed.  They told her it was most likely due to infection and to follow-up with cardiology.  Patient denies palpitations, chest pain, shortness of breath.  Reports intermittent headaches but states that she has migraines and these  have not changed since blood pressure fluctuations.  She called cardiology about 3 days ago and advised them of symptoms.  They told her to stop taking the amlodipine blood pressure medication and to follow-up with family doctor given that her daily medications may need to be changed.  Other pertinent medical history includes stroke that occurred 8 days after delivery of her second child about 5 years ago where she is followed by neurology.     Past Medical History:  Diagnosis Date   Anxiety    Depression    GERD (gastroesophageal reflux disease)    Headache    Hypertension    Hypothyroidism    Migraine    Noonan syndrome    S/P tubal ligation 10/06/2017   Stroke ([place])    SVD (spontaneous vaginal delivery) 10/05/2017   Thyroid disease     Patient Active Problem List   Diagnosis Date Noted   Chronic intractable headache 12/19/2021   Chronic migraine without aura, with intractable migraine, so stated, with status migrainosus 11/25/2020   Severe recurrent major depression without psychotic features ([place]) 10/27/2018   Postpartum hypertension 10/30/2017   S/P tubal ligation 10/06/2017   SVD (spontaneous vaginal delivery) 10/05/2017   Labor and delivery indication for care or intervention 10/04/2017   Preeclampsia in postpartum period 06/07/2015   Intrauterine growth restriction affecting antepartum care of mother 05/29/2015   PROM (premature rupture of membranes) 05/27/2015   NAUSEA AND VOMITING  05/26/2009   ABDOMINAL PAIN, LUQ 05/26/2009    Past Surgical History:  Procedure Laterality Date   BUBBLE STUDY  04/27/2022   Procedure: BUBBLE STUDY;  Surgeon: Adrian Prows, MD;  Location: [facility];  Service: Cardiovascular;;   CHOLECYSTECTOMY N/A 04/16/2021   Procedure: LAPAROSCOPIC CHOLECYSTECTOMY;  Surgeon: Mickeal Skinner, MD;  Location: [facility];  Service: General;  Laterality: N/A;   TEE WITHOUT CARDIOVERSION N/A 04/27/2022   Procedure: TRANSESOPHAGEAL ECHOCARDIOGRAM (TEE);   Surgeon: Adrian Prows, MD;  Location: [facility];  Service: Cardiovascular;  Laterality: N/A;   TOE SURGERY  04/2005   TONSILLECTOMY  11/2004   TUBAL LIGATION N/A 10/06/2017   Procedure: POST PARTUM TUBAL LIGATION;  Surgeon: Janyth Contes, MD;  Location: [facility];  Service: Gynecology;  Laterality: N/A;    OB History     Gravida  2   Para  2   Term  2   Preterm      AB      Living  2      SAB      IAB      Ectopic      Multiple  0   Live Births  2            Home Medications    Prior to Admission medications   Medication Sig Start Date End Date Taking? Authorizing Provider  amLODipine (NORVASC) 5 MG tablet Take 0.5 tablets (2.5 mg total) by mouth daily. 08/24/22   Tolia, Sunit, DO  aspirin EC ([facility] ASPIRIN LOW DOSE) 81 MG tablet Take 1 tablet (81 mg total) by mouth daily. SWALLOW WHOLE. 08/15/22   Melvenia Beam, MD  Atogepant (QULIPTA) 60 MG TABS Take 1 tablet (60 mg total) by mouth daily. 11/11/22   Lomax, Amy, NP  atorvastatin (LIPITOR) 20 MG tablet TAKE 1 TABLET BY MOUTH EVERY DAY 07/06/22   Lomax, Amy, NP  Biotin 5 MG CAPS Take 5 mg by mouth daily.    [provider]  buPROPion (WELLBUTRIN XL) 300 MG 24 hr tablet Take 300 mg by mouth daily.    [provider]  busPIRone (BUSPAR) 15 MG tablet Take 15 mg by mouth daily. 10/21/20   [provider]  calcium carbonate (OS-CAL) 600 MG TABS tablet Take 1,200 mg of elemental calcium by mouth daily.    [provider]  cholecalciferol (VITAMIN D3) 25 MCG (1000 UNIT) tablet Take 1,000 Units by mouth daily.    [provider]  ELDERBERRY PO Take 1 tablet by mouth daily.    [provider]  EPINEPHrine 0.3 mg/0.3 mL IJ SOAJ injection Inject 0.3 mg into the muscle as needed. Patient not taking: Reported on 11/11/2022    [provider]  fluticasone (VERAMYST) 27.5 MCG/SPRAY nasal spray Place 2 sprays into the nose daily.    [provider]  Fluvoxamine Maleate 150 MG CP24 Take 300 mg by mouth at bedtime.    [provider]  ibuprofen (ADVIL) 200 MG tablet Take 800 mg by mouth every 8 (eight) hours as needed for cramping or moderate pain.    [provider]  levothyroxine (SYNTHROID) 25 MCG tablet Take 25 mcg by mouth daily before breakfast. 11/20/21   [provider]  loratadine (CLARITIN) 10 MG tablet Take 10 mg by mouth daily.    [provider]  metoCLOPramide (REGLAN) 10 MG tablet TAKE 1 TABLET (10 MG TOTAL) BY MOUTH EVERY 6 (SIX) HOURS AS NEEDED. FOR MIGRAINE OR NAUSEA. 05/04/22  Lomax, Amy, NP  Multiple Vitamins-Minerals (ONE-A-DAY FOR HER VITACRAVES PO) Take 1 tablet by mouth daily.    [provider]  Omega-3 Fatty Acids (FISH OIL) 1000 MG CAPS Take 1,000 mg by mouth daily.    [provider]  ondansetron (ZOFRAN) 8 MG tablet Take 8 mg by mouth every 8 (eight) hours as needed for nausea/vomiting. 03/04/21   [provider]  Rimegepant Sulfate (NURTEC) 75 MG TBDP Take 1 tablet (75 mg total) by mouth daily as needed (take for abortive therapy of migraine, no more than 1 tablet in 24 hours or 10 per month). 11/11/22   Lomax, Amy, NP  traZODone (DESYREL) 50 MG tablet Take 50 mg by mouth at bedtime. 04/08/22   [provider]  vitamin B-12 (CYANOCOBALAMIN) 1000 MCG tablet Take 1,000 mcg by mouth daily.    [provider]    Family History Family History  Problem Relation Age of Onset   High blood pressure Maternal Grandmother    Cancer Maternal Grandmother        "they removed it so she's fine"   Diabetes Paternal Grandmother    Cancer Paternal Grandmother    Heart attack Father    Migraines Father    Heart attack Paternal Grandfather     Social History Social History   Tobacco Use   Smoking status: Former    Packs/day: 1.50    Types: Cigarettes    Quit date: 10/03/2020    Years since quitting: 2.1   Smokeless tobacco: Never  Vaping Use    Vaping Use: Never used  Substance Use Topics   Alcohol use: Not Currently   Drug use: Never     Allergies   Cinnamon and Codeine   Review of Systems Review of Systems Per HPI  Physical Exam Triage Vital Signs ED Triage Vitals [11/28/22 1228]  Enc Vitals Group     BP 127/80     Pulse Rate 90     Resp 16     Temp 97.8 F (36.6 C)     Temp Source Oral     SpO2 98 %     Weight      Height      Head Circumference      Peak Flow      Pain Score 0     Pain Loc      Pain Edu?      Excl. in [place]?    No data found.  Updated Vital Signs BP 127/80 (BP Location: Left Arm)   Pulse 90   Temp 97.8 F (36.6 C) (Oral)   Resp 16   SpO2 98%   Breastfeeding No   Visual Acuity Right Eye Distance:   Left Eye Distance:   Bilateral Distance:    Right Eye Near:   Left Eye Near:    Bilateral Near:     Physical Exam Constitutional:      General: She is not in acute distress.    Appearance: Normal appearance. She is not toxic-appearing or diaphoretic.  HENT:     Head: Normocephalic and atraumatic.  Eyes:     Extraocular Movements: Extraocular movements intact.     Conjunctiva/sclera: Conjunctivae normal.     Pupils: Pupils are equal, round, and reactive to light.  Cardiovascular:     Rate and Rhythm: Normal rate and regular rhythm.     Pulses: Normal pulses.     Heart sounds: Normal heart sounds.  Pulmonary:     Effort: Pulmonary  effort is normal. No respiratory distress.     Breath sounds: Normal breath sounds.  Neurological:     General: No focal deficit present.     Mental Status: She is alert and oriented to person, place, and time. Mental status is at baseline.     Cranial Nerves: Cranial nerves 2-12 are intact.     Sensory: Sensation is intact.     Motor: Motor function is intact.     Coordination: Coordination is intact.     Gait: Gait is intact.  Psychiatric:        Mood and Affect: Mood normal.        Behavior: Behavior normal.        Thought Content:  Thought content normal.        Judgment: Judgment normal.      UC Treatments / Results  Labs (all labs ordered are listed, but only abnormal results are displayed) Labs Reviewed  POCT URINALYSIS DIP (MANUAL ENTRY) - Abnormal; Notable for the following components:      Result Value   Bilirubin, UA small (*)    Ketones, POC UA trace (5) (*)    Protein Ur, POC =30 (*)    Urobilinogen, UA 2.0 (*)    All other components within normal limits  CBC  COMPREHENSIVE METABOLIC PANEL  TSH    EKG   Radiology No results found.  Procedures Procedures (including critical care time)  Medications Ordered in UC Medications - No data to display  Initial Impression / Assessment and Plan / UC Course  I have reviewed the triage vital signs and the nursing notes.  Pertinent labs & imaging results that were available during my care of the patient were reviewed by me and considered in my medical decision making (see chart for details).     Vital signs, neuroexam, physical exam is stable so do not think that emergent evaluation is necessary due to patient's associated symptoms.  I do think that extensive evaluation is necessary.  EKG was completed that was negative for any acute abnormalities especially when compared to previous EKGs.  UA was unremarkable for any acute abnormality.  Cardiology was concerned that patient's daily medications in combination could be causing fluctuations in blood pressure.  Although, patient reports that she has been taking these medications for multiple years and tolerating well.  Will obtain CBC, CMP, TSH.  Awaiting results.  Patient advised to follow-up with family doctor and cardiology for further evaluation and management.  Patient was advised to go to the ER if any symptoms persist or worsen.  Patient verbalized understanding and was agreeable with plan. Final Clinical Impressions(s) / UC Diagnoses   Final diagnoses:  Fluctuating blood pressure     Discharge  Instructions      Urine did not show any obvious abnormalities.  EKG was normal.  Blood work is pending.  We will call if there are any abnormalities.  Please follow-up with family doctor and cardiology for further evaluation and management.  Continue to monitor blood pressure.  Go to the ER if any symptoms persist or worsen.    ED Prescriptions   None    PDMP not reviewed this encounter.   Teodora Medici, [place] 11/28/22 1400

## 2022-11-28 NOTE — Discharge Instructions (Signed)
Urine did not show any obvious abnormalities.  EKG was normal.  Blood work is pending.  We will call if there are any abnormalities.  Please follow-up with family doctor and cardiology for further evaluation and management.  Continue to monitor blood pressure.  Go to the ER if any symptoms persist or worsen.

## 2022-11-29 LAB — COMPREHENSIVE METABOLIC PANEL
ALT: 8 IU/L (ref 0–32)
AST: 8 IU/L (ref 0–40)
Albumin/Globulin Ratio: 1.5 (ref 1.2–2.2)
Albumin: 3.8 g/dL — ABNORMAL LOW (ref 3.9–4.9)
Alkaline Phosphatase: 107 IU/L (ref 44–121)
BUN/Creatinine Ratio: 8 — ABNORMAL LOW (ref 9–23)
BUN: 6 mg/dL (ref 6–20)
Bilirubin Total: 0.2 mg/dL (ref 0.0–1.2)
CO2: 24 mmol/L (ref 20–29)
Calcium: 9.3 mg/dL (ref 8.7–10.2)
Chloride: 103 mmol/L (ref 96–106)
Creatinine, Ser: 0.72 mg/dL (ref 0.57–1.00)
Globulin, Total: 2.6 g/dL (ref 1.5–4.5)
Glucose: 82 mg/dL (ref 70–99)
Potassium: 4.6 mmol/L (ref 3.5–5.2)
Sodium: 140 mmol/L (ref 134–144)
Total Protein: 6.4 g/dL (ref 6.0–8.5)
eGFR: 113 mL/min/{1.73_m2} (ref >59)

## 2022-11-29 LAB — CBC
Hematocrit: 36.6 % (ref 34.0–46.6)
Hemoglobin: 12.1 g/dL (ref 11.1–15.9)
MCH: 29.2 pg (ref 26.6–33.0)
MCHC: 33.1 g/dL (ref 31.5–35.7)
MCV: 88 fL (ref 79–97)
Platelets: 240 10*3/uL (ref 150–450)
RBC: 4.14 x10E6/uL (ref 3.77–5.28)
RDW: 11.6 % — ABNORMAL LOW (ref 11.7–15.4)
WBC: 7.2 10*3/uL (ref 3.4–10.8)

## 2022-11-29 LAB — TSH: TSH: 0.916 u[IU]/mL (ref 0.450–4.500)

## 2022-12-07 ENCOUNTER — Encounter: Payer: Self-pay | Admitting: Gastroenterology

## 2022-12-10 ENCOUNTER — Emergency Department (HOSPITAL_COMMUNITY): Payer: Medicaid Other

## 2022-12-10 ENCOUNTER — Emergency Department (HOSPITAL_COMMUNITY)
Admission: EM | Admit: 2022-12-10 | Discharge: 2022-12-10 | Disposition: A | Discharge status: Home / Self Care | Payer: Medicaid Other | Attending: Emergency Medicine | Admitting: Emergency Medicine

## 2022-12-10 ENCOUNTER — Encounter (HOSPITAL_COMMUNITY): Payer: Self-pay

## 2022-12-10 DIAGNOSIS — R519 Headache, unspecified: Secondary | ICD-10-CM | POA: Diagnosis not present

## 2022-12-10 DIAGNOSIS — Z79899 Other long term (current) drug therapy: Secondary | ICD-10-CM | POA: Diagnosis not present

## 2022-12-10 DIAGNOSIS — Z8673 Personal history of transient ischemic attack (TIA), and cerebral infarction without residual deficits: Secondary | ICD-10-CM | POA: Diagnosis not present

## 2022-12-10 DIAGNOSIS — I10 Essential (primary) hypertension: Secondary | ICD-10-CM | POA: Insufficient documentation

## 2022-12-10 DIAGNOSIS — Z7982 Long term (current) use of aspirin: Secondary | ICD-10-CM | POA: Insufficient documentation

## 2022-12-10 DIAGNOSIS — R110 Nausea: Secondary | ICD-10-CM | POA: Diagnosis not present

## 2022-12-10 DIAGNOSIS — E039 Hypothyroidism, unspecified: Secondary | ICD-10-CM | POA: Insufficient documentation

## 2022-12-10 DIAGNOSIS — Z79890 Hormone replacement therapy (postmenopausal): Secondary | ICD-10-CM | POA: Insufficient documentation

## 2022-12-10 DIAGNOSIS — R42 Dizziness and giddiness: Secondary | ICD-10-CM | POA: Insufficient documentation

## 2022-12-10 DIAGNOSIS — D72829 Elevated white blood cell count, unspecified: Secondary | ICD-10-CM | POA: Insufficient documentation

## 2022-12-10 LAB — CBC WITH DIFFERENTIAL/PLATELET
Abs Immature Granulocytes: 0.08 10*3/uL — ABNORMAL HIGH (ref 0.00–0.07)
Basophils Absolute: 0 10*3/uL (ref 0.0–0.1)
Basophils Relative: 0 %
Eosinophils Absolute: 0 10*3/uL (ref 0.0–0.5)
Eosinophils Relative: 0 %
HCT: 38.8 % (ref 36.0–46.0)
Hemoglobin: 12.3 g/dL (ref 12.0–15.0)
Immature Granulocytes: 1 %
Lymphocytes Relative: 7 %
Lymphs Abs: 0.9 10*3/uL (ref 0.7–4.0)
MCH: 29.2 pg (ref 26.0–34.0)
MCHC: 31.7 g/dL (ref 30.0–36.0)
MCV: 92.2 fL (ref 80.0–100.0)
Monocytes Absolute: 0.6 10*3/uL (ref 0.1–1.0)
Monocytes Relative: 5 %
Neutro Abs: 11.4 10*3/uL — ABNORMAL HIGH (ref 1.7–7.7)
Neutrophils Relative %: 87 %
Platelets: 244 10*3/uL (ref 150–400)
RBC: 4.21 MIL/uL (ref 3.87–5.11)
RDW: 12.9 % (ref 11.5–15.5)
WBC: 13.1 10*3/uL — ABNORMAL HIGH (ref 4.0–10.5)
nRBC: 0 % (ref 0.0–0.2)

## 2022-12-10 LAB — COMPREHENSIVE METABOLIC PANEL
ALT: 12 U/L (ref 0–44)
AST: 12 U/L — ABNORMAL LOW (ref 15–41)
Albumin: 3.5 g/dL (ref 3.5–5.0)
Alkaline Phosphatase: 81 U/L (ref 38–126)
Anion gap: 9 (ref 5–15)
BUN: 6 mg/dL (ref 6–20)
CO2: 22 mmol/L (ref 22–32)
Calcium: 9 mg/dL (ref 8.9–10.3)
Chloride: 105 mmol/L (ref 98–111)
Creatinine, Ser: 0.79 mg/dL (ref 0.44–1.00)
GFR, Estimated: >60 mL/min (ref >60)
Glucose, Bld: 100 mg/dL — ABNORMAL HIGH (ref 70–99)
Potassium: 3.5 mmol/L (ref 3.5–5.1)
Sodium: 136 mmol/L (ref 135–145)
Total Bilirubin: 0.9 mg/dL (ref 0.3–1.2)
Total Protein: 6.6 g/dL (ref 6.5–8.1)

## 2022-12-10 LAB — PREGNANCY, URINE: Preg Test, Ur: NEGATIVE

## 2022-12-10 LAB — TROPONIN I (HIGH SENSITIVITY)
Troponin I (High Sensitivity): 5 ng/L (ref <18)
Troponin I (High Sensitivity): 4 ng/L (ref <18)

## 2022-12-10 MED ORDER — ONDANSETRON 4 MG PO TBDP
4.0000 mg | ORAL_TABLET | Freq: Once | ORAL | Status: AC
  Administered 2022-12-10: 4 mg via ORAL
  Filled 2022-12-10: qty 1

## 2022-12-10 MED ORDER — LORAZEPAM 1 MG PO TABS: 1.0000 mg | ORAL_TABLET | Freq: Once | ORAL | Status: DC

## 2022-12-10 MED ORDER — SODIUM CHLORIDE 0.9 % IV BOLUS
500.0000 mL | Freq: Once | INTRAVENOUS | Status: AC
  Administered 2022-12-10: 500 mL via INTRAVENOUS

## 2022-12-10 MED ORDER — LORAZEPAM 1 MG PO TABS
0.5000 mg | ORAL_TABLET | Freq: Once | ORAL | Status: AC
  Administered 2022-12-10: 0.5 mg via ORAL
  Filled 2022-12-10: qty 1

## 2022-12-10 MED ORDER — MECLIZINE HCL 25 MG PO TABS
25.0000 mg | ORAL_TABLET | Freq: Once | ORAL | Status: AC
  Administered 2022-12-10: 25 mg via ORAL
  Filled 2022-12-10: qty 1

## 2022-12-10 NOTE — ED Provider Notes (Signed)
[facility] Provider Note   CSN: [id number] Arrival date & time: 12/10/22  1541     History No chief complaint on file.   Christine Beard is a 32 y.o. female with medical history of thyroid disease, stroke, Noonan syndrome, migraines, hypothyroidism, hypertension, chronic headaches, GERD, depression, vertigo.  Patient presents to ED for evaluation of dizziness and nausea.  Patient reports that she has had dizziness persistently for the last few weeks, chest pain for the last few months.  Patient reports that she woke up this morning with continued dizziness and nausea.  Patient reports that she has chronic headaches, sees neurology, there are no new features to her headaches and migraines.  The patient goes on to report that this morning she woke up with a blood pressure of 143/80.  Patient states that she is supposed to be taking 5 mg amlodipine as needed for blood pressure elevation and she took her 5 mg amlodipine after noting her elevated blood pressure this morning.  Patient goes on to state that she woke up feeling nauseated so "something must be wrong".  Patient denies any fevers, shortness of breath, abdominal pain, vomiting, diarrhea, one-sided weakness or numbness.  HPI     Home Medications Prior to Admission medications   Medication Sig Start Date End Date Taking? Authorizing Provider  amLODipine (NORVASC) 5 MG tablet Take 0.5 tablets (2.5 mg total) by mouth daily. 08/24/22   Tolia, Sunit, DO  aspirin EC ([employer] ASPIRIN LOW DOSE) 81 MG tablet Take 1 tablet (81 mg total) by mouth daily. SWALLOW WHOLE. 08/15/22   Melvenia Beam, MD  Atogepant (QULIPTA) 60 MG TABS Take 1 tablet (60 mg total) by mouth daily. 11/11/22   Lomax, Amy, NP  atorvastatin (LIPITOR) 20 MG tablet TAKE 1 TABLET BY MOUTH EVERY DAY 07/06/22   Lomax, Amy, NP  Biotin 5 MG CAPS Take 5 mg by mouth daily.    [provider]  buPROPion (WELLBUTRIN XL) 300 MG 24 hr  tablet Take 300 mg by mouth daily.    [provider]  busPIRone (BUSPAR) 15 MG tablet Take 15 mg by mouth daily. 10/21/20   [provider]  calcium carbonate (OS-CAL) 600 MG TABS tablet Take 1,200 mg of elemental calcium by mouth daily.    [provider]  cholecalciferol (VITAMIN D3) 25 MCG (1000 UNIT) tablet Take 1,000 Units by mouth daily.    [provider]  ELDERBERRY PO Take 1 tablet by mouth daily.    [provider]  EPINEPHrine 0.3 mg/0.3 mL IJ SOAJ injection Inject 0.3 mg into the muscle as needed. Patient not taking: Reported on 11/11/2022    [provider]  fluticasone (VERAMYST) 27.5 MCG/SPRAY nasal spray Place 2 sprays into the nose daily.    [provider]  Fluvoxamine Maleate 150 MG CP24 Take 300 mg by mouth at bedtime.    [provider]  ibuprofen (ADVIL) 200 MG tablet Take 800 mg by mouth every 8 (eight) hours as needed for cramping or moderate pain.    [provider]  levothyroxine (SYNTHROID) 25 MCG tablet Take 25 mcg by mouth daily before breakfast. 11/20/21   [provider]  loratadine (CLARITIN) 10 MG tablet Take 10 mg by mouth daily.    [provider]  metoCLOPramide (REGLAN) 10 MG tablet TAKE 1 TABLET (10 MG TOTAL) BY MOUTH EVERY 6 (SIX) HOURS AS NEEDED. FOR MIGRAINE OR NAUSEA. 05/04/22   Lomax, Amy,  NP  Multiple Vitamins-Minerals (ONE-A-DAY FOR HER VITACRAVES PO) Take 1 tablet by mouth daily.    [provider]  Omega-3 Fatty Acids (FISH OIL) 1000 MG CAPS Take 1,000 mg by mouth daily.    [provider]  ondansetron (ZOFRAN) 8 MG tablet Take 8 mg by mouth every 8 (eight) hours as needed for nausea/vomiting. 03/04/21   [provider]  Rimegepant Sulfate (NURTEC) 75 MG TBDP Take 1 tablet (75 mg total) by mouth daily as needed (take for abortive therapy of migraine, no more than 1 tablet in 24 hours or 10 per month). 11/11/22   Lomax, Amy, NP  traZODone  (DESYREL) 50 MG tablet Take 50 mg by mouth at bedtime. 04/08/22   [provider]  vitamin B-12 (CYANOCOBALAMIN) 1000 MCG tablet Take 1,000 mcg by mouth daily.    [provider]      Allergies    Cinnamon and Codeine    Review of Systems   Review of Systems  Respiratory:  Negative for shortness of breath.   Cardiovascular:  Positive for chest pain.  Gastrointestinal:  Positive for nausea.  Neurological:  Positive for dizziness and headaches.  All other systems reviewed and are negative.   Physical Exam Updated Vital Signs BP 110/63   Pulse 93   Temp 98.6 F (37 C) (Oral)   Resp 18   Ht 4' 7.5" (1.41 m)   Wt 42.2 kg   SpO2 100%   BMI 21.23 kg/m  Physical Exam Vitals and nursing note reviewed.  Constitutional:      General: She is not in acute distress.    Appearance: Normal appearance. She is not ill-appearing, toxic-appearing or diaphoretic.  HENT:     Head: Normocephalic and atraumatic.     Nose: Nose normal.     Mouth/Throat:     Mouth: Mucous membranes are moist.     Pharynx: Oropharynx is clear.  Eyes:     Extraocular Movements: Extraocular movements intact.     Conjunctiva/sclera: Conjunctivae normal.     Pupils: Pupils are equal, round, and reactive to light.  Cardiovascular:     Rate and Rhythm: Normal rate and regular rhythm.  Pulmonary:     Effort: Pulmonary effort is normal.     Breath sounds: Normal breath sounds. No wheezing.  Abdominal:     General: Abdomen is flat. Bowel sounds are normal.     Palpations: Abdomen is soft.     Tenderness: There is no abdominal tenderness.  Musculoskeletal:     Cervical back: Normal range of motion and neck supple. No tenderness.     Right lower leg: No edema.     Left lower leg: No edema.  Skin:    General: Skin is warm and dry.     Capillary Refill: Capillary refill takes less than 2 seconds.  Neurological:     General: No focal deficit present.     Mental Status: She is alert and oriented to  person, place, and time.     GCS: GCS eye subscore is 4. GCS verbal subscore is 5. GCS motor subscore is 6.     Cranial Nerves: Cranial nerves 2-12 are intact. No cranial nerve deficit.     Sensory: Sensation is intact. No sensory deficit.     Motor: Motor function is intact. No weakness.     Coordination: Coordination is intact. Heel to [facility] Test normal.     Comments: No pronator drift, no slurred speech, no facial droop.  Cranial nerves II through XII intact.  Intact finger-nose, heel-to-shin.  Equal grip strength bilaterally upper and lower extremities.     ED Results / Procedures / Treatments   Labs (all labs ordered are listed, but only abnormal results are displayed) Labs Reviewed  COMPREHENSIVE METABOLIC PANEL - Abnormal; Notable for the following components:      Result Value   Glucose, Bld 100 (*)    AST 12 (*)    All other components within normal limits  CBC WITH DIFFERENTIAL/PLATELET - Abnormal; Notable for the following components:   WBC 13.1 (*)    Neutro Abs 11.4 (*)    Abs Immature Granulocytes 0.08 (*)    All other components within normal limits  PREGNANCY, URINE  TROPONIN I (HIGH SENSITIVITY)  TROPONIN I (HIGH SENSITIVITY)    EKG None  Radiology CT Head Wo Contrast  Result Date: 12/10/2022 CLINICAL DATA:  Headache, increasing frequency or severity EXAM: CT HEAD WITHOUT CONTRAST TECHNIQUE: Contiguous axial images were obtained from the base of the skull through the vertex without intravenous contrast. RADIATION DOSE REDUCTION: This exam was performed according to the departmental dose-optimization program which includes automated exposure control, adjustment of the mA and/or kV according to patient size and/or use of iterative reconstruction technique. COMPARISON:  Head CT 12/26/2020 FINDINGS: Brain: No intracranial hemorrhage, mass effect, or midline shift. No hydrocephalus. The basilar cisterns are patent. No evidence of territorial infarct or acute ischemia. No  extra-axial or intracranial fluid collection. Vascular: No hyperdense vessel or unexpected calcification. Skull: No fracture or focal lesion. Sinuses/Orbits: Complete opacification of the included right maxillary sinus, subtotal opacification of right ethmoid air cells. No mastoid effusion. Other: None. IMPRESSION: 1. No acute intracranial abnormality. 2. Right maxillary and ethmoid sinus disease, new from 2022 exam. Electronically Signed   By: Keith Rake M.D.   On: 12/10/2022 18:11    Procedures Procedures   Medications Ordered in ED Medications  meclizine (ANTIVERT) tablet 25 mg (25 mg Oral Given 12/10/22 1619)  ondansetron (ZOFRAN-ODT) disintegrating tablet 4 mg (4 mg Oral Given 12/10/22 1619)  sodium chloride 0.9 % bolus 500 mL (0 mLs Intravenous Stopped 12/10/22 2217)  LORazepam (ATIVAN) tablet 0.5 mg (0.5 mg Oral Given 12/10/22 2052)    ED Course/ Medical Decision Making/ A&P Clinical Course as of 12/10/22 2242  Fri Dec 10, 2022  1937 CT Head Wo Contrast [CG]    Clinical Course User Index [CG] Azucena Cecil, PA-C    Medical Decision Making Amount and/or Complexity of Data Reviewed Radiology:  Decision-making details documented in ED Course.   32 year old female presents to ED for evaluation.  Please see HPI for further details.  On examination the patient is afebrile and nontachycardic.  The patient lung sounds are clear bilaterally, she is not hypoxic.  The abdomen is soft and compressible throughout.  No focal neurodeficits on examination.  Nontoxic in appearance.  Patient workup will include CBC, CMP, troponin x 2, pregnancy test, CT head, EKG.  CBC with leukocytosis of 13.1 however stable hemoglobin.  CMP unremarkable.  Troponin 5, troponin 4 respectively.  Pregnancy test negative.  CT head shows no intracranial abnormality.  EKG is nonischemic.  Patient provided 4 mg Zofran in triage along with 25 mg of meclizine, reports that her nausea has decreased however  continues to be dizzy.  On my examination the patient is still complaining of dizziness.  The patient is tachycardic with a pulse rate of 107 so we will give her 500 mL fluid  and 0.5 mg Ativan.  On reassessment patient states that her dizziness has subsided.  Patient reports he feels better and requesting discharge.  At this time, the patient will be sent home.  The patient will be advised to follow-up with her neurologist.  The patient was given return precautions and she voiced understanding.  The patient had all her questions answered to her satisfaction.  The patient is stable for discharge.   Final Clinical Impression(s) / ED Diagnoses Final diagnoses:  Dizziness  Acute nonintractable headache, unspecified headache type    Rx / DC Orders ED Discharge Orders     None         Lawana Chambers 12/10/22 2242    Blanchie Dessert, MD 12/11/22 1858

## 2022-12-10 NOTE — ED Provider Triage Note (Cosign Needed Addendum)
Emergency Medicine Provider Triage Evaluation Note  Christine Beard , a 32 y.o. female  was evaluated in triage.  Pt complains of dizziness, headache, nausea.  Patient states that she has been having symptoms for the past 2 to 3 weeks worsened with positional changes.  States that she had acute worsening today when she was standing in line waiting to pick up her.  Denies visual disturbance, slurred speech, facial droop.  Patient with history of stroke with residual symptoms being bilateral hearing deficits per patient.  Reports some central chest pain.  Denies fever, chills, cough, congestion, abdominal pain, urinary symptoms, change in bowel habits.  Patient also reports burning sensation from "shoulders through my whole body" for the past couple of months.  Review of Systems  Positive: See above Negative:   Physical Exam  BP (!) 142/75   Pulse (!) 115   Temp 98.3 F (36.8 C)   Resp 16   SpO2 99%  Gen:   Awake, no distress   Resp:  Normal effort  MSK:   Moves extremities without difficulty  Other:  Cranial nerves III through XII grossly intact.  Patient able to ambulate unassisted but cautiously in room.  Slight rightward beating nystagmus appreciated.  Medical Decision Making  Medically screening exam initiated at 4:12 PM.  Appropriate orders placed.  Christine Beard was informed that the remainder of the evaluation will be completed by another provider, this initial triage assessment does not replace that evaluation, and the importance of remaining in the ED until their evaluation is complete.      Christine Beard, [place] 12/10/22 [phone]

## 2022-12-10 NOTE — Discharge Instructions (Signed)
Return to the ED with any new or worsening signs or symptoms Please follow-up with your neurologist for further management Please continue taking all your medications as prescribed

## 2022-12-10 NOTE — ED Triage Notes (Signed)
Pt bib ems; c/o dizziness and nausea that started this am; hx noonan's syndrome; also c/o burning sensation from bilateral shoulders throughout body x couple of months, as well as intermittent CP x couple of months; decreased po intake; 130/72, P 114 reg; RR 18, 99% RA, cbg 23; hx stroke, memory impairment

## 2022-12-10 NOTE — ED Notes (Signed)
Pt in CT.

## 2022-12-10 NOTE — ED Notes (Signed)
Pt back in lobby at this time

## 2022-12-10 NOTE — ED Notes (Signed)
Pt assisted to the bathroom. Urine specimen obtained and sent to lab. Fall risk bracelet placed on pt d/t pt dizzy. Fall risk assessment completed.

## 2022-12-13 ENCOUNTER — Ambulatory Visit: Payer: Self-pay | Admitting: Cardiology

## 2022-12-17 NOTE — Progress Notes (Signed)
Appointment rescheduled for 12/28/2022.  No charge.  My potential

## 2022-12-28 ENCOUNTER — Ambulatory Visit: Payer: Medicaid Other | Admitting: Cardiology

## 2023-01-24 ENCOUNTER — Ambulatory Visit (INDEPENDENT_AMBULATORY_CARE_PROVIDER_SITE_OTHER): Payer: Medicaid Other | Admitting: Gastroenterology

## 2023-01-24 ENCOUNTER — Encounter: Payer: Self-pay | Admitting: Gastroenterology

## 2023-01-24 ENCOUNTER — Other Ambulatory Visit (INDEPENDENT_AMBULATORY_CARE_PROVIDER_SITE_OTHER): Payer: Medicaid Other

## 2023-01-24 VITALS — BP 102/80 | HR 98 | Ht <= 58 in | Wt 89.0 lb

## 2023-01-24 DIAGNOSIS — Z8673 Personal history of transient ischemic attack (TIA), and cerebral infarction without residual deficits: Secondary | ICD-10-CM

## 2023-01-24 DIAGNOSIS — R112 Nausea with vomiting, unspecified: Secondary | ICD-10-CM

## 2023-01-24 DIAGNOSIS — K5900 Constipation, unspecified: Secondary | ICD-10-CM | POA: Diagnosis not present

## 2023-01-24 DIAGNOSIS — R634 Abnormal weight loss: Secondary | ICD-10-CM

## 2023-01-24 LAB — PROTIME-INR
INR: 1 ratio (ref 0.8–1.0)
Prothrombin Time: 11.1 s (ref 9.6–13.1)

## 2023-01-24 LAB — IBC + FERRITIN
Ferritin: 6.7 ng/mL — ABNORMAL LOW (ref 10.0–291.0)
Iron: 34 ug/dL — ABNORMAL LOW (ref 42–145)
Saturation Ratios: 8.9 % — ABNORMAL LOW (ref 20.0–50.0)
TIBC: 380.8 ug/dL (ref 250.0–450.0)
Transferrin: 272 mg/dL (ref 212.0–360.0)

## 2023-01-24 LAB — FOLATE: Folate: 16.1 ng/mL (ref >5.9)

## 2023-01-24 LAB — VITAMIN B12: Vitamin B-12: >1500 pg/mL — ABNORMAL HIGH (ref 211–911)

## 2023-01-24 LAB — C-REACTIVE PROTEIN: CRP: <1 mg/dL (ref 0.5–20.0)

## 2023-01-24 LAB — VITAMIN D 25 HYDROXY (VIT D DEFICIENCY, FRACTURES): VITD: 37.35 ng/mL (ref 30.00–100.00)

## 2023-01-24 NOTE — Progress Notes (Signed)
HPI : Christine Beard is a very pleasant 32 year old female with a history of Noonan syndrome, anxiety, depression, migraines and cryptogenic stroke who is referred to [place] by Lance Bosch, NP for further evaluation and management of chronic constipation, nausea and weight loss.  Patient states that she has had problems with constipation since she was a baby, and she has had issues with nausea and vomiting since she was a young child. The patient states that she has a bowel movement on average maybe once or twice a week.  Very rarely will she have a spontaneous bowel movement; usually she has to take something to help her go.  She takes MiraLAX most the time  Constipation since she was a baby Starting having nausea and vomiting since she was 32 years old Has lost a lot of weight. No diarrhea.  No serious pain, just discomfort.  Has a BM maybe 1-2x/week.  Rarely has a spontaneous BM Usually takes MiraLax Takes Lot of straining, stools are hard and difficult to pass Stable symptoms  Nausea is daily problem; worse over the past few months Vomits maybe 2-3x/week.  Not related to eating.   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, right sided hemiparesis, dysarthria; has residual confusion  Weight fluctuates   July 2022: 112lb July 2023: 96 lb April 2024: 89lb  Past Medical History:  Diagnosis Date   Anxiety    Depression    GERD (gastroesophageal reflux disease)    Headache    Hypertension    Hypothyroidism    Migraine    Noonan syndrome    S/P tubal ligation 10/06/2017   Stroke    SVD (spontaneous vaginal delivery) 10/05/2017   Thyroid disease      Past Surgical History:  Procedure Laterality Date   BUBBLE STUDY  04/27/2022   Procedure: BUBBLE STUDY;  Surgeon: Yates Decamp, MD;  Location: [facility] ENDOSCOPY;  Service: Cardiovascular;;   CHOLECYSTECTOMY N/A 04/16/2021   Procedure: LAPAROSCOPIC CHOLECYSTECTOMY;  Surgeon: Rodman Pickle, MD;  Location: [facility] ORS;  Service: General;  Laterality: N/A;   TEE WITHOUT CARDIOVERSION N/A 04/27/2022   Procedure: TRANSESOPHAGEAL ECHOCARDIOGRAM (TEE);  Surgeon: Yates Decamp, MD;  Location: [facility] ENDOSCOPY;  Service: Cardiovascular;  Laterality: N/A;   TOE SURGERY  04/2005   TONSILLECTOMY  11/2004   TUBAL LIGATION N/A 10/06/2017   Procedure: POST PARTUM TUBAL LIGATION;  Surgeon: Sherian Rein, MD;  Location: [facility];  Service: Gynecology;  Laterality: N/A;   Family History  Problem Relation Age of Onset   High blood pressure Maternal Grandmother    Cancer Maternal Grandmother        "they removed it so she's fine"   Diabetes Paternal Grandmother    Cancer Paternal Grandmother    Heart attack Father    Migraines Father    Heart attack Paternal Grandfather    Social History   Tobacco Use   Smoking status: Former    Packs/day: 1.5    Types: Cigarettes    Quit date: 10/03/2020    Years since quitting: 2.3   Smokeless tobacco: Never  Vaping Use   Vaping Use: Never used  Substance Use Topics   Alcohol use: Not Currently   Drug use: Never   Current Outpatient Medications  Medication Sig Dispense Refill   amLODipine (NORVASC) 5 MG tablet Take 0.5 tablets (2.5 mg total) by mouth daily. 90 tablet 0   aspirin EC ([employer] ASPIRIN LOW DOSE) 81 MG tablet  Take 1 tablet (81 mg total) by mouth daily. SWALLOW WHOLE. 90 tablet 4   Atogepant (QULIPTA) 60 MG TABS Take 1 tablet (60 mg total) by mouth daily. 90 tablet 3   atorvastatin (LIPITOR) 20 MG tablet TAKE 1 TABLET BY MOUTH EVERY DAY 90 tablet 3   Biotin 5 MG CAPS Take 5 mg by mouth daily.     buPROPion (WELLBUTRIN XL) 300 MG 24 hr tablet Take 300 mg by mouth daily.     busPIRone (BUSPAR) 15 MG tablet Take 15 mg by mouth daily.     calcium carbonate (OS-CAL) 600 MG TABS tablet Take 1,200 mg of elemental calcium by mouth daily.     cholecalciferol (VITAMIN D3) 25 MCG (1000 UNIT) tablet Take 1,000 Units by mouth daily.      ELDERBERRY PO Take 1 tablet by mouth daily.     EPINEPHrine 0.3 mg/0.3 mL IJ SOAJ injection Inject 0.3 mg into the muscle as needed.     fluticasone (VERAMYST) 27.5 MCG/SPRAY nasal spray Place 2 sprays into the nose daily.     Fluvoxamine Maleate 150 MG CP24 Take 300 mg by mouth at bedtime.     ibuprofen (ADVIL) 200 MG tablet Take 800 mg by mouth every 8 (eight) hours as needed for cramping or moderate pain.     levothyroxine (SYNTHROID) 25 MCG tablet Take 25 mcg by mouth daily before breakfast.     loratadine (CLARITIN) 10 MG tablet Take 10 mg by mouth daily.     metoCLOPramide (REGLAN) 10 MG tablet TAKE 1 TABLET (10 MG TOTAL) BY MOUTH EVERY 6 (SIX) HOURS AS NEEDED. FOR MIGRAINE OR NAUSEA. 30 tablet 0   Multiple Vitamins-Minerals (ONE-A-DAY FOR HER VITACRAVES PO) Take 1 tablet by mouth daily.     Omega-3 Fatty Acids (FISH OIL) 1000 MG CAPS Take 1,000 mg by mouth daily.     ondansetron (ZOFRAN) 8 MG tablet Take 8 mg by mouth every 8 (eight) hours as needed for nausea/vomiting.     Rimegepant Sulfate (NURTEC) 75 MG TBDP Take 1 tablet (75 mg total) by mouth daily as needed (take for abortive therapy of migraine, no more than 1 tablet in 24 hours or 10 per month). 8 tablet 11   traZODone (DESYREL) 50 MG tablet Take 50 mg by mouth at bedtime.     vitamin B-12 (CYANOCOBALAMIN) 1000 MCG tablet Take 1,000 mcg by mouth daily.     No current facility-administered medications for this visit.   Allergies  Allergen Reactions   Cinnamon Itching and Other (See Comments)   Codeine Nausea And Vomiting and Other (See Comments)     Review of Systems: All systems reviewed and negative except where noted in HPI.    No results found.  Physical Exam: BP 102/80   Pulse 98   Ht 4\' 7"  (1.397 m)   Wt 89 lb (40.4 kg)   BMI 20.69 kg/m  Constitutional: Pleasant,well-developed, ***female in no acute distress. HEENT: Normocephalic and atraumatic. Conjunctivae are normal. No scleral icterus. Neck supple.   Cardiovascular: Normal rate, regular rhythm.  Pulmonary/chest: Effort normal and breath sounds normal. No wheezing, rales or rhonchi. Abdominal: Soft, nondistended, nontender. Bowel sounds active throughout. There are no masses palpable. No hepatomegaly. Extremities: no edema Lymphadenopathy: No cervical adenopathy noted. Neurological: Alert and oriented to person place and time. Skin: Skin is warm and dry. No rashes noted. Psychiatric: Normal mood and affect. Behavior is normal.  CBC    Component Value Date/Time   WBC 13.1 (H)  12/10/2022 1625   RBC 4.21 12/10/2022 1625   HGB 12.3 12/10/2022 1625   HGB 12.1 11/28/2022 1338   HCT 38.8 12/10/2022 1625   HCT 36.6 11/28/2022 1338   PLT 244 12/10/2022 1625   PLT 240 11/28/2022 1338   MCV 92.2 12/10/2022 1625   MCV 88 11/28/2022 1338   MCH 29.2 12/10/2022 1625   MCHC 31.7 12/10/2022 1625   RDW 12.9 12/10/2022 1625   RDW 11.6 (L) 11/28/2022 1338   LYMPHSABS 0.9 12/10/2022 1625   MONOABS 0.6 12/10/2022 1625   EOSABS 0.0 12/10/2022 1625   BASOSABS 0.0 12/10/2022 1625    CMP     Component Value Date/Time   NA 136 12/10/2022 1625   NA 140 11/28/2022 1338   K 3.5 12/10/2022 1625   CL 105 12/10/2022 1625   CO2 22 12/10/2022 1625   GLUCOSE 100 (H) 12/10/2022 1625   BUN 6 12/10/2022 1625   BUN 6 11/28/2022 1338   CREATININE 0.79 12/10/2022 1625   CALCIUM 9.0 12/10/2022 1625   PROT 6.6 12/10/2022 1625   PROT 6.4 11/28/2022 1338   ALBUMIN 3.5 12/10/2022 1625   ALBUMIN 3.8 (L) 11/28/2022 1338   AST 12 (L) 12/10/2022 1625   ALT 12 12/10/2022 1625   ALKPHOS 81 12/10/2022 1625   BILITOT 0.9 12/10/2022 1625   BILITOT 0.2 11/28/2022 1338   GFRNONAA >60 12/10/2022 1625   GFRAA >60 10/28/2018 0646     ASSESSMENT AND PLAN:  N/V/weight loss - EGD - CRP, B12, Vit D, iron, TTG/IgA  Constipation - MiraLax daily, increase to 2-3x/day - Senna - Consider ARM  Lance Bosch, NP

## 2023-01-24 NOTE — Patient Instructions (Signed)
_______________________________________________________  If your blood pressure at your visit was 140/90 or greater, please contact your primary care physician to follow up on this.  _______________________________________________________  If you are age 34 or older, your body mass index should be between 23-30. Your Body mass index is 20.69 kg/m. If this is out of the aforementioned range listed, please consider follow up with your Primary Care Provider.  If you are age 45 or younger, your body mass index should be between 19-25. Your Body mass index is 20.69 kg/m. If this is out of the aformentioned range listed, please consider follow up with your Primary Care Provider.   You have been scheduled for an endoscopy. Please follow written instructions given to you at your visit today. If you use inhalers (even only as needed), please bring them with you on the day of your procedure.   Your provider has requested that you go to the basement level for lab work before leaving today. Press "B" on the elevator. The lab is located at the first door on the left as you exit the elevator.  Start Miralax daily. Increase to 2-3 times daily.  Start Senna 8.6 mg . Take two tablets at night when you do not have a bowel movement in three days  Due to recent changes in healthcare laws, you may see the results of your imaging and laboratory studies on MyChart before your provider has had a chance to review them.  We understand that in some cases there may be results that are confusing or concerning to you. Not all laboratory results come back in the same time frame and the provider may be waiting for multiple results in order to interpret others.  Please give [place] 48 hours in order for your provider to thoroughly review all the results before contacting the office for clarification of your results.   It was a pleasure to see you today!  Thank you for trusting me with your gastrointestinal care!           ________________________________________________________  The [facility] providers would like to encourage you to use [facility] to communicate with providers for non-urgent requests or questions.  Due to long hold times on the telephone, sending your provider a message by [facility] may be a faster and more efficient way to get a response.  Please allow 48 business hours for a response.  Please remember that this is for non-urgent requests.  _______________________________________________________

## 2023-01-25 LAB — IGA: Immunoglobulin A: 241 mg/dL (ref 47–310)

## 2023-01-25 LAB — TISSUE TRANSGLUTAMINASE, IGA: (tTG) Ab, IgA: <1 U/mL

## 2023-01-26 NOTE — Progress Notes (Signed)
Christine Beard, All of your labs looked good, except your iron levels were a little low.  Most commonly this is due to menstrual blood loss in females.  Your may benefit from oral iron supplements, although this would likely worsen your constipation and nausea.  As you are not anemic, I do not feel strongly you need to take iron.   We will rule out some other causes of iron deficiency when we perform you upper endoscopy.

## 2023-01-31 ENCOUNTER — Ambulatory Visit (AMBULATORY_SURGERY_CENTER): Payer: Medicaid Other | Admitting: Gastroenterology

## 2023-01-31 ENCOUNTER — Encounter: Payer: Self-pay | Admitting: Gastroenterology

## 2023-01-31 VITALS — BP 107/67 | HR 76 | Temp 98.6°F | Resp 16 | Ht <= 58 in | Wt 89.0 lb

## 2023-01-31 DIAGNOSIS — R634 Abnormal weight loss: Secondary | ICD-10-CM | POA: Diagnosis not present

## 2023-01-31 DIAGNOSIS — K209 Esophagitis, unspecified without bleeding: Secondary | ICD-10-CM

## 2023-01-31 DIAGNOSIS — K2950 Unspecified chronic gastritis without bleeding: Secondary | ICD-10-CM | POA: Diagnosis not present

## 2023-01-31 DIAGNOSIS — R112 Nausea with vomiting, unspecified: Secondary | ICD-10-CM | POA: Diagnosis not present

## 2023-01-31 MED ORDER — SODIUM CHLORIDE 0.9 % IV SOLN: 500.0000 mL | Freq: Once | INTRAVENOUS | Status: DC

## 2023-01-31 MED ORDER — OMEPRAZOLE 20 MG PO CPDR
20.0000 mg | DELAYED_RELEASE_CAPSULE | Freq: Every day | ORAL | 3 refills | Status: DC
Start: 2023-01-31 — End: 2024-03-08

## 2023-01-31 NOTE — Op Note (Addendum)
[facility] Patient Name: Christine Beard Procedure Date: 01/31/2023 10:26 AM MRN: [id number] Endoscopist: Lorin Picket E. Tomasa Rand , MD, [id number] Age: 32 Referring MD:  Date of Birth: [birth year] Gender: Female Account #: [id number] Procedure:                Upper GI endoscopy Indications:              Nausea with vomiting, Weight loss Medicines:                Monitored Anesthesia Care Procedure:                Pre-Anesthesia Assessment:                           - Prior to the procedure, a History and Physical                            was performed, and patient medications and                            allergies were reviewed. The patient's tolerance of                            previous anesthesia was also reviewed. The risks                            and benefits of the procedure and the sedation                            options and risks were discussed with the patient.                            All questions were answered, and informed consent                            was obtained. Prior Anticoagulants: The patient has                            taken no anticoagulant or antiplatelet agents. ASA                            Grade Assessment: II - A patient with mild systemic                            disease. After reviewing the risks and benefits,                            the patient was deemed in satisfactory condition to                            undergo the procedure.                           After obtaining informed consent, the endoscope was  passed under direct vision. Throughout the                            procedure, the patient's blood pressure, pulse, and                            oxygen saturations were monitored continuously. The                            Olympus Scope [id number] was introduced through the                            mouth, and advanced to the second part of duodenum.                            The  upper GI endoscopy was accomplished without                            difficulty. The patient tolerated the procedure                            well. Scope In: Scope Out: Findings:                 The examined portions of the nasopharynx,                            oropharynx and larynx were normal.                           Non-severe esophagitis with no bleeding was found                            (erythema without mucosal break).                           The exam of the esophagus was otherwise normal.                           A 3 cm hiatal hernia was present.                           A few (3-5) dispersed diminutive erosions with no                            bleeding and no stigmata of recent bleeding were                            found at the incisura and in the gastric antrum.                            Biopsies were taken with a cold forceps for                            Helicobacter pylori testing. Estimated  blood loss                            was minimal.                           The exam of the stomach was otherwise normal.                           The examined duodenum was normal. Biopsies for                            histology were taken with a cold forceps for                            evaluation of celiac disease. Estimated blood loss                            was minimal. Complications:            No immediate complications. Estimated Blood Loss:     Estimated blood loss was minimal. Impression:               - The examined portions of the nasopharynx,                            oropharynx and larynx were normal.                           - Non-severe reflux esophagitis with no bleeding.                           - 3 cm hiatal hernia.                           - Erosive gastropathy with no bleeding and no                            stigmata of recent bleeding. Biopsied.                           - Normal examined duodenum. Biopsied.                            - No obvious endoscopic abnormalities to explain                            patient's chronic nausea/vomiting and weight loss. Recommendation:           - Patient has a contact number available for                            emergencies. The signs and symptoms of potential                            delayed complications were discussed with the  patient. Return to normal activities tomorrow.                            Written discharge instructions were provided to the                            patient.                           - Resume previous diet.                           - Continue present medications.                           - Await pathology results.                           - Avoid NSAIDs                           - Use Prilosec (omeprazole) 20 mg PO daily. Persis Graffius E. Tomasa Rand, MD 01/31/2023 10:52:23 AM This report has been signed electronically.

## 2023-01-31 NOTE — Progress Notes (Signed)
Called to room to assist during endoscopic procedure.  Patient ID and intended procedure confirmed with present staff. Received instructions for my participation in the procedure from the performing physician.  

## 2023-01-31 NOTE — Progress Notes (Signed)
Uneventful anesthetic. Report to pacu rn. Vss. Care resumed by rn. 

## 2023-01-31 NOTE — Patient Instructions (Addendum)
-  await pathology results -Continue present medications.  - Avoid NSAIDs -handout on hiatal hernia   YOU HAD AN ENDOSCOPIC PROCEDURE TODAY AT THE [facility]:   Refer to the procedure report that was given to you for any specific questions about what was found during the examination.  If the procedure report does not answer your questions, please call your gastroenterologist to clarify.  If you requested that your care partner not be given the details of your procedure findings, then the procedure report has been included in a sealed envelope for you to review at your convenience later.  YOU SHOULD EXPECT: Some feelings of bloating in the abdomen. Passage of more gas than usual.  Walking can help get rid of the air that was put into your GI tract during the procedure and reduce the bloating. If you had a lower endoscopy (such as a colonoscopy or flexible sigmoidoscopy) you may notice spotting of blood in your stool or on the toilet paper. If you underwent a bowel prep for your procedure, you may not have a normal bowel movement for a few days.  Please Note:  You might notice some irritation and congestion in your nose or some drainage.  This is from the oxygen used during your procedure.  There is no need for concern and it should clear up in a day or so.  SYMPTOMS TO REPORT IMMEDIATELY:   Following upper endoscopy (EGD)  Vomiting of blood or coffee ground material  New chest pain or pain under the shoulder blades  Painful or persistently difficult swallowing  New shortness of breath  Fever of 100F or higher  Black, tarry-looking stools  For urgent or emergent issues, a gastroenterologist can be reached at any hour by calling [phone]. Do not use MyChart messaging for urgent concerns.    DIET:  We do recommend a small meal at first, but then you may proceed to your regular diet.  Drink plenty of fluids but you should avoid alcoholic beverages for 24 hours.  ACTIVITY:   You should plan to take it easy for the rest of today and you should NOT DRIVE or use heavy machinery until tomorrow (because of the sedation medicines used during the test).    FOLLOW UP: Our staff will call the number listed on your records the next business day following your procedure.  We will call around 7:15- 8:00 am to check on you and address any questions or concerns that you may have regarding the information given to you following your procedure. If we do not reach you, we will leave a message.     If any biopsies were taken you will be contacted by phone or by letter within the next 1-3 weeks.  Please call us at [phone] if you have not heard about the biopsies in 3 weeks.    SIGNATURES/CONFIDENTIALITY: You and/or your care partner have signed paperwork which will be entered into your electronic medical record.  These signatures attest to the fact that that the information above on your After Visit Summary has been reviewed and is understood.  Full responsibility of the confidentiality of this discharge information lies with you and/or your care-partner.

## 2023-01-31 NOTE — Progress Notes (Signed)
History and Physical Interval Note:  01/31/2023 10:26 AM  Christine Beard  has presented today for endoscopic procedure(s), with the diagnosis of  Encounter Diagnosis  Name Primary?   Nausea and vomiting, unspecified vomiting type Yes  .  The various methods of evaluation and treatment have been discussed with the patient and/or family. After consideration of risks, benefits and other options for treatment, the patient has consented to  the endoscopic procedure(s).   The patient's history has been reviewed, patient examined, no change in status, stable for endoscopic procedure(s).  I have reviewed the patient's chart and labs.  Questions were answered to the patient's satisfaction.     Tabitha Tupper E. Tomasa Rand, MD [facility]

## 2023-02-01 ENCOUNTER — Telehealth: Payer: Self-pay | Admitting: *Deleted

## 2023-02-01 NOTE — Telephone Encounter (Signed)
No answer on  follow up call. Left message.   

## 2023-02-28 NOTE — Progress Notes (Signed)
Christine Beard,  I apologize for the long delay in addressing your results.  The H. Pylori test results were not reported until May 23rd.   The biopsies taken from your stomach were notable for mild chronic gastritis (inflammation) which is a common finding, but there was no evidence of Helicobacter pylori infection. This common finding is not likely to explain abdominal pain and there is no specific treatment or further evaluation recommended. 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  This finding is thought to represent an increased risk of stomach cancer in some patients.  Patients at highest risk are those patients with a family history of stomach cancer and those with a smoking history, as well as patients who are from areas with high rates of stomach cancer. Periodic surveillance (repeat endoscopy with biopsies) is often performed to monitor for development of cancer or further precancerous changes. I would recommend we repeat an upper endoscopy in 3 years.  Please follow up with me in the office as needed to discuss this further as well as management of your chronic GI symptoms.

## 2023-03-01 ENCOUNTER — Telehealth (HOSPITAL_COMMUNITY): Payer: Self-pay | Admitting: *Deleted

## 2023-03-01 NOTE — Telephone Encounter (Signed)
Reaching out to patient to offer assistance regarding upcoming cardiac imaging study; pt verbalizes understanding of appt date/time, parking situation and where to check in and verified current allergies; name and call back number provided for further questions should they arise  Larey Brick RN Navigator Cardiac Imaging [facility] [phone] office [phone] cell  Patient does report some mild claustrophobia and he was encouraged to ask her provider for medications for the test.  She is aware she will need a driver.  Patient denies metal.

## 2023-03-02 ENCOUNTER — Ambulatory Visit (HOSPITAL_COMMUNITY)
Admission: RE | Admit: 2023-03-02 | Discharge: 2023-03-02 | Disposition: A | Discharge status: Home / Self Care | Payer: Medicaid Other | Source: Ambulatory Visit | Attending: Cardiology | Admitting: Cardiology

## 2023-03-02 ENCOUNTER — Other Ambulatory Visit: Payer: Self-pay | Admitting: Cardiology

## 2023-03-02 DIAGNOSIS — Q8719 Other congenital malformation syndromes predominantly associated with short stature: Secondary | ICD-10-CM

## 2023-03-02 MED ORDER — LORAZEPAM 2 MG/ML IJ SOLN
INTRAMUSCULAR | Status: AC
  Filled 2023-03-02: qty 1

## 2023-03-02 MED ORDER — LORAZEPAM 2 MG/ML IJ SOLN
0.5000 mg | Freq: Once | INTRAMUSCULAR | Status: AC
  Administered 2023-03-02: 0.5 mg via INTRAVENOUS

## 2023-03-02 MED ORDER — GADOBUTROL 1 MMOL/ML IV SOLN
10.0000 mL | Freq: Once | INTRAVENOUS | Status: AC | PRN
  Administered 2023-03-02: 10 mL via INTRAVENOUS

## 2023-03-10 ENCOUNTER — Ambulatory Visit: Payer: Medicaid Other | Admitting: Cardiology

## 2023-03-10 ENCOUNTER — Encounter: Payer: Self-pay | Admitting: Cardiology

## 2023-03-10 VITALS — BP 111/70 | HR 83 | Ht <= 58 in | Wt 88.6 lb

## 2023-03-10 DIAGNOSIS — I10 Essential (primary) hypertension: Secondary | ICD-10-CM

## 2023-03-10 DIAGNOSIS — Q8719 Other congenital malformation syndromes predominantly associated with short stature: Secondary | ICD-10-CM

## 2023-03-10 DIAGNOSIS — I639 Cerebral infarction, unspecified: Secondary | ICD-10-CM

## 2023-03-10 DIAGNOSIS — I491 Atrial premature depolarization: Secondary | ICD-10-CM

## 2023-03-10 DIAGNOSIS — G43809 Other migraine, not intractable, without status migrainosus: Secondary | ICD-10-CM

## 2023-03-10 DIAGNOSIS — Z87891 Personal history of nicotine dependence: Secondary | ICD-10-CM

## 2023-03-10 NOTE — Progress Notes (Signed)
Primary Physician/Referring:  Lance Bosch, NP  Patient ID: [name], female    DOB: Nov 26, 1990, 32 y.o.   MRN: [id number]  Date: 03/10/23 Last Office Visit: 10/18/2022  Chief Complaint  Patient presents with   Follow-up    Noonan syndrome, reviewed cardiac MRI, blood pressure management   HPI:    Christine Beard  is a 32 y.o. Caucasian female with history of preeclampsia and postpartum hypertension, former smoker, PACs, hypertension, cryptogenic stroke, Noonan syndrome, migraine, anxiety/depression, OCD.  After her second pregnancy patient had a cryptogenic stroke approximately 9 days after delivery and the workup thereafter was essentially unremarkable.  She was referred to cardiology for further evaluation and management.  Since establishing care she has undergone a cardiac monitor as well as a TEE.  Both TEE and transcranial Doppler are negative for PFO.  Patient presents today for 27-month follow-up visit.  Since last office visit she had a cardiac MRI to evaluate for possible pulmonic stenosis given diagnosis of Noonan syndrome.  Results of the cardiac MRI reviewed with the patient.  He denies anginal chest pain or heart failure symptoms.  Her blood pressures are also well-controlled.  Past Medical History:  Diagnosis Date   Anemia    Anxiety    Depression    GERD (gastroesophageal reflux disease)    Headache    Hyperlipidemia    Hypertension    Hypothyroidism    Migraine    Noonan syndrome    S/P tubal ligation 10/06/2017   Stroke (HCC)    SVD (spontaneous vaginal delivery) 10/05/2017   Thyroid disease    Past Surgical History:  Procedure Laterality Date   BUBBLE STUDY  04/27/2022   Procedure: BUBBLE STUDY;  Surgeon: Yates Decamp, MD;  Location: [facility] ENDOSCOPY;  Service: Cardiovascular;;   CHOLECYSTECTOMY N/A 04/16/2021   Procedure: LAPAROSCOPIC CHOLECYSTECTOMY;  Surgeon: Rodman Pickle, MD;  Location: [facility] ORS;  Service: General;  Laterality: N/A;    TEE WITHOUT CARDIOVERSION N/A 04/27/2022   Procedure: TRANSESOPHAGEAL ECHOCARDIOGRAM (TEE);  Surgeon: Yates Decamp, MD;  Location: [facility] ENDOSCOPY;  Service: Cardiovascular;  Laterality: N/A;   TOE SURGERY  04/2005   TONSILLECTOMY  11/2004   TUBAL LIGATION N/A 10/06/2017   Procedure: POST PARTUM TUBAL LIGATION;  Surgeon: Sherian Rein, MD;  Location: [facility];  Service: Gynecology;  Laterality: N/A;   Family History  Problem Relation Age of Onset   High blood pressure Maternal Grandmother    Cancer Maternal Grandmother        "they removed it so she's fine"   Diabetes Paternal Grandmother    Cancer Paternal Grandmother    Heart attack Father    Migraines Father    Heart attack Paternal Grandfather     Social History   Tobacco Use   Smoking status: Former    Packs/day: 1.5    Types: Cigarettes    Quit date: 10/03/2020    Years since quitting: 2.4   Smokeless tobacco: Never  Substance Use Topics   Alcohol use: Not Currently   Marital Status: Significant Other   ROS  Review of Systems  Cardiovascular:  Negative for chest pain, claudication, dyspnea on exertion, irregular heartbeat, leg swelling, near-syncope, orthopnea, palpitations, paroxysmal nocturnal dyspnea and syncope.  Respiratory:  Negative for shortness of breath.   Hematologic/Lymphatic: Negative for bleeding problem.  Musculoskeletal:  Negative for muscle cramps and myalgias.  Neurological:  Negative for dizziness and light-headedness.    Objective      03/10/2023  9:30 AM 01/31/2023   11:03 AM 01/31/2023   10:53 AM  Vitals with BMI  Height 4\' 7"     Weight 88 lbs 10 oz    BMI 20.59    Systolic 111 107 161  Diastolic 70 67 60  Pulse 83 76 87   Physical Exam  Constitutional: No distress.  Age appropriate, hemodynamically stable.   Neck: No JVD present.  Cardiovascular: Normal rate, regular rhythm, S1 normal, S2 normal, intact distal pulses and normal pulses. Exam reveals no gallop, no S3 and  no S4.  No murmur heard. Pulmonary/Chest: Effort normal and breath sounds normal. No stridor. She has no wheezes. She has no rales.  Abdominal: Soft. Bowel sounds are normal. She exhibits no distension. There is no abdominal tenderness.  Musculoskeletal:        General: No edema.     Cervical back: Neck supple.  Neurological: She is alert and oriented to person, place, and time. She has intact cranial nerves (2-12).  Skin: Skin is warm and moist.   Laboratory examination:      Latest Ref Rng & Units 12/10/2022    4:25 PM 11/28/2022    1:38 PM 04/10/2021   10:22 AM  CMP  Glucose 70 - 99 mg/dL 096  82  89   BUN 6 - 20 mg/dL 6  6  11    Creatinine 0.44 - 1.00 mg/dL 0.45  4.09  8.11   Sodium 135 - 145 mmol/L 136  140  142   Potassium 3.5 - 5.1 mmol/L 3.5  4.6  3.7   Chloride 98 - 111 mmol/L 105  103  113   CO2 22 - 32 mmol/L 22  24  22    Calcium 8.9 - 10.3 mg/dL 9.0  9.3  9.3   Total Protein 6.5 - 8.1 g/dL 6.6  6.4  6.8   Total Bilirubin 0.3 - 1.2 mg/dL 0.9  0.2  0.5   Alkaline Phos 38 - 126 U/L 81  107  67   AST 15 - 41 U/L 12  8  20    ALT 0 - 44 U/L 12  8  22        Latest Ref Rng & Units 12/10/2022    4:25 PM 11/28/2022    1:38 PM 04/10/2021   10:22 AM  CBC  WBC 4.0 - 10.5 K/uL 13.1  7.2  5.3   Hemoglobin 12.0 - 15.0 g/dL 91.4  78.2  95.6   Hematocrit 36.0 - 46.0 % 38.8  36.6  36.3   Platelets 150 - 400 K/uL 244  240  175    Lipid Panel     Component Value Date/Time   CHOL 193 12/10/2020 1120   TRIG 38 12/10/2020 1120   HDL 79 12/10/2020 1120   CHOLHDL 2.4 12/10/2020 1120   CHOLHDL 3.2 10/28/2018 0646   VLDL 47 (H) 10/28/2018 0646   LDLCALC 106 (H) 12/10/2020 1120   HEMOGLOBIN A1C Lab Results  Component Value Date   HGBA1C 4.4 (L) 12/10/2020   MPG 82.45 10/28/2018   TSH Recent Labs    11/28/22 1338  TSH 0.916   External labs:  10/27/2021: Magnesium 1.9 Sodium 139, potassium 4.0, BUN 13, creatinine 0.87, alk phos 65, ALT 11, AST 12, GFR >60 Hgb 12.7, HCT 38.4,  platelet 163  03/12/2021: TSH 2.68  12/10/2020: Total cholesterol 193, HDL 79, LDL 106, triglycerides 38  08/04/2020: Hemoglobin 12.3, hematocrit 37.4, MCV 91,  RDW 11.3, CBC otherwise normal Glucose 88, BUN 9,  creatinine 0.58, GFR 123, sodium 140, potassium 4.7, chloride 109, calcium 8.4, CMP otherwise normal. TSH 4.08 (normal) Vitamin D 44.2 (normal)  Allergies   Allergies  Allergen Reactions   Cinnamon Itching and Other (See Comments)   Codeine Nausea And Vomiting and Other (See Comments)    Medications Prior to Visit:   Outpatient Medications Prior to Visit  Medication Sig Dispense Refill   aspirin EC ([employer] ASPIRIN LOW DOSE) 81 MG tablet Take 1 tablet (81 mg total) by mouth daily. SWALLOW WHOLE. 90 tablet 4   Atogepant (QULIPTA) 60 MG TABS Take 1 tablet (60 mg total) by mouth daily. 90 tablet 3   atorvastatin (LIPITOR) 20 MG tablet TAKE 1 TABLET BY MOUTH EVERY DAY 90 tablet 3   Biotin 5 MG CAPS Take 5 mg by mouth daily.     buPROPion (WELLBUTRIN XL) 300 MG 24 hr tablet Take 300 mg by mouth daily.     busPIRone (BUSPAR) 15 MG tablet Take 15 mg by mouth daily.     calcium carbonate (OS-CAL) 600 MG TABS tablet Take 1,200 mg of elemental calcium by mouth daily.     cholecalciferol (VITAMIN D3) 25 MCG (1000 UNIT) tablet Take 1,000 Units by mouth daily.     ELDERBERRY PO Take 1 tablet by mouth daily.     fluticasone (VERAMYST) 27.5 MCG/SPRAY nasal spray Place 2 sprays into the nose daily.     Fluvoxamine Maleate 150 MG CP24 Take 300 mg by mouth at bedtime.     ibuprofen (ADVIL) 200 MG tablet Take 800 mg by mouth every 8 (eight) hours as needed for cramping or moderate pain.     levothyroxine (SYNTHROID) 25 MCG tablet Take 25 mcg by mouth daily before breakfast.     loratadine (CLARITIN) 10 MG tablet Take 10 mg by mouth daily.     Multiple Vitamins-Minerals (ONE-A-DAY FOR HER VITACRAVES PO) Take 1 tablet by mouth daily.     Omega-3 Fatty Acids (FISH OIL) 1000 MG CAPS Take 1,000 mg by  mouth daily.     omeprazole (PRILOSEC) 20 MG capsule Take 1 capsule (20 mg total) by mouth daily. 90 capsule 3   ondansetron (ZOFRAN) 8 MG tablet Take 8 mg by mouth every 8 (eight) hours as needed for nausea/vomiting.     Rimegepant Sulfate (NURTEC) 75 MG TBDP Take 1 tablet (75 mg total) by mouth daily as needed (take for abortive therapy of migraine, no more than 1 tablet in 24 hours or 10 per month). 8 tablet 11   vitamin B-12 (CYANOCOBALAMIN) 1000 MCG tablet Take 1,000 mcg by mouth daily.     amLODipine (NORVASC) 5 MG tablet Take 0.5 tablets (2.5 mg total) by mouth daily. (Patient not taking: Reported on 03/10/2023) 90 tablet 0   EPINEPHrine 0.3 mg/0.3 mL IJ SOAJ injection Inject 0.3 mg into the muscle as needed. (Patient not taking: Reported on 01/31/2023)     traZODone (DESYREL) 50 MG tablet Take 50 mg by mouth at bedtime. (Patient not taking: Reported on 03/10/2023)     metoCLOPramide (REGLAN) 10 MG tablet TAKE 1 TABLET (10 MG TOTAL) BY MOUTH EVERY 6 (SIX) HOURS AS NEEDED. FOR MIGRAINE OR NAUSEA. 30 tablet 0   No facility-administered medications prior to visit.   Final Medications at End of Visit    Current Meds  Medication Sig   aspirin EC ([employer] ASPIRIN LOW DOSE) 81 MG tablet Take 1 tablet (81 mg total) by mouth daily. SWALLOW WHOLE.   Atogepant (QULIPTA) 60 MG  TABS Take 1 tablet (60 mg total) by mouth daily.   atorvastatin (LIPITOR) 20 MG tablet TAKE 1 TABLET BY MOUTH EVERY DAY   Biotin 5 MG CAPS Take 5 mg by mouth daily.   buPROPion (WELLBUTRIN XL) 300 MG 24 hr tablet Take 300 mg by mouth daily.   busPIRone (BUSPAR) 15 MG tablet Take 15 mg by mouth daily.   calcium carbonate (OS-CAL) 600 MG TABS tablet Take 1,200 mg of elemental calcium by mouth daily.   cholecalciferol (VITAMIN D3) 25 MCG (1000 UNIT) tablet Take 1,000 Units by mouth daily.   ELDERBERRY PO Take 1 tablet by mouth daily.   fluticasone (VERAMYST) 27.5 MCG/SPRAY nasal spray Place 2 sprays into the nose daily.   Fluvoxamine  Maleate 150 MG CP24 Take 300 mg by mouth at bedtime.   ibuprofen (ADVIL) 200 MG tablet Take 800 mg by mouth every 8 (eight) hours as needed for cramping or moderate pain.   levothyroxine (SYNTHROID) 25 MCG tablet Take 25 mcg by mouth daily before breakfast.   loratadine (CLARITIN) 10 MG tablet Take 10 mg by mouth daily.   Multiple Vitamins-Minerals (ONE-A-DAY FOR HER VITACRAVES PO) Take 1 tablet by mouth daily.   Omega-3 Fatty Acids (FISH OIL) 1000 MG CAPS Take 1,000 mg by mouth daily.   omeprazole (PRILOSEC) 20 MG capsule Take 1 capsule (20 mg total) by mouth daily.   ondansetron (ZOFRAN) 8 MG tablet Take 8 mg by mouth every 8 (eight) hours as needed for nausea/vomiting.   Rimegepant Sulfate (NURTEC) 75 MG TBDP Take 1 tablet (75 mg total) by mouth daily as needed (take for abortive therapy of migraine, no more than 1 tablet in 24 hours or 10 per month).   vitamin B-12 (CYANOCOBALAMIN) 1000 MCG tablet Take 1,000 mcg by mouth daily.   Radiology:   No results found. Transcranial Doppler with bubbles 01/23/2021: No HITS at rest or during Valsalva. Negative transcranial Doppler Bubble study with no evidence of right to left intracardiac communication.   A vascular evaluation was performed. The right middle cerebral artery was studied. An IV was inserted into the patient's left antecubital. Verbal informed consent was obtained.  Cardiac Studies:  CARDIAC DATABASE: EKG: 10-28-22: Sinus rhythm, 87 bpm, normal axis, without underlying ischemia or injury pattern.  Echocardiogram: 11/20/2020 Normal LV systolic function with visual EF 55-60%. Left ventricle cavity is normal in size. Normal global wall motion. Normal diastolic filling pattern, normal LAP. Mild tricuspid regurgitation. No evidence of pulmonary hypertension. No prior study for comparison.  TEE 04/27/2022:  1. Left ventricular ejection fraction, by estimation, is 60 to 65%. The  left ventricle has normal function. The left ventricle  has no regional  wall motion abnormalities.   2. Right ventricular systolic function is normal. The right ventricular  size is normal.   3. No left atrial/left atrial appendage thrombus was detected.   4. There was weekly positive right to left shunting with late appearence  ofthe contrast bubbles after 6-8 cycles. Good study done and repeated x  2.. Agitated saline contrast bubble study was positive with shunting  observed after >6 cardiac cycles  suggestive of intrapulmonary shunting.   5. The mitral valve is normal in structure. No evidence of mitral valve  regurgitation. No evidence of mitral stenosis.   6. The aortic valve is normal in structure. Aortic valve regurgitation is  not visualized. No aortic stenosis is present.   7. The inferior vena cava is normal in size with greater than 50%  respiratory variability, suggesting right atrial pressure of 3 mmHg.   Long-term monitor 7 days 11/03/2020-11/10/2020: Predominant underlying rhythm was sinus with minimum heart rate of 57 bpm, maximum heart rate of 153 bpm, average heart rate of 85 bpm.  Symptoms correlated with sinus rates >90 bpm, PVCs and PACs.  PACs and PVCs were rare, PAC couplets and triplets were rare.  No PVC couplets or triplets.  No ventricular tachycardia, pauses >3 seconds, high degree AV block, atrial fibrillation, or supraventricular tachycardia greater than 4 beats.   cMRI June 2024 1. Normal cardiac valves no evidence of pulmonary valve stenosis peak velocity 0.9 cm/sec   2.  Bovine arch with normal diameter and no coarctation   3. No evidence of PFO/ASD with Qp/Qs <1 by flow and 1.05 by stroke volumes   4.  Normal LV size and function LVEF 53%   5.  Low normal RVEF 50%   6.  No delayed hyper-enhancement with gadolinium   7.  Estimated cardiac output 4.4 L/min   8.  Normal parametric measures see above    Assessment     ICD-10-CM   1. Noonan syndrome  Q87.19     2. Cryptogenic stroke (HCC)  I63.9      3. PAC (premature atrial contraction)  I49.1     4. Benign hypertension  I10     5. Other migraine without status migrainosus, not intractable  G43.809     6. Former smoker  Z87.891         Medications Discontinued During This Encounter  Medication Reason   metoCLOPramide (REGLAN) 10 MG tablet     No orders of the defined types were placed in this encounter.   Recommendations:   TEXANNA MCCLAUGHERTY is a 32 y.o. Caucasian female with history of preeclampsia and postpartum hypertension, former smoker, PACs, hypertension, cryptogenic stroke, Noonan syndrome, migraine, anxiety/depression, OCD.  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 Prior echocardiograms did not illustrate evidence of pulmonary stenosis Cardiac MRI since last office visit was performed, no evidence of pulmonic stenosis or other congenital anomalies.  Cryptogenic stroke (HCC) Currently on aspirin and statin therapy.   No recurrence of TIA or strokelike symptoms. Cardiac monitoring did not illustrate evidence of atrial fibrillation during the monitoring period.   Transesophageal echocardiogram negative for cardiac embolic source. In the past we have discussed undergoing loop recorder implantation to evaluate for A-fib. Patient chose to proceed with a smart watch technology and she has been recording her EKG tracings.  EKG tracings reviewed with the patient predominantly sinus rhythm/sinus tachycardia no obvious evidence of A-fib.  PAC (premature atrial contraction) Currently asymptomatic. Monitor for now.  Benign hypertension Office blood pressures are well-controlled. Reemphasized the importance of a low-salt diet.  Other migraine without status migrainosus, not intractable Currently on medical therapy. Follows up with neurology.  Former smoker Educated on the importance of continued smoking cessation.  No orders of the defined  types were placed in this encounter.  --Continue cardiac medications as reconciled in final medication list. --Return in about 1 year (around 03/09/2024) for Follow up Noonan syndrome, history of stroke. Or sooner if needed. --Continue follow-up with your primary care physician regarding the management of your other chronic comorbid conditions.  Patient's questions and concerns were addressed to her satisfaction. She voices understanding of the instructions provided during this encounter.   This note was created using a voice recognition software as a result there may  be grammatical errors inadvertently enclosed that do not reflect the nature of this encounter. Every attempt is made to correct such errors.  Tessa Lerner, [place], [facility]  Pager:  [phone] Office: [phone]

## 2023-04-08 ENCOUNTER — Telehealth: Payer: Self-pay

## 2023-04-08 ENCOUNTER — Other Ambulatory Visit (HOSPITAL_COMMUNITY): Payer: Self-pay

## 2023-04-08 NOTE — Telephone Encounter (Signed)
Pharmacy Patient Advocate Encounter   Received notification from [facility] that prior authorization for Nurtec 75MG  dispersible tablets is required/requested.   PA submitted to [facility] via CoverMyMeds Key or [facility]) confirmation # [id number]  Status is pending

## 2023-04-11 NOTE — Telephone Encounter (Signed)
Pharmacy Patient Advocate Encounter  Received notification from [employer]  that the request for prior authorization for Nurtec has been denied due to See below.      Please be advised we currently do not have a Pharmacist to review denials, therefore you will need to process appeals accordingly as needed. Thanks for your support at this time.   You may call [phone] or fax [phone], to appeal. The denial letter has been placed in the chart under the media tab.

## 2023-04-11 NOTE — Telephone Encounter (Signed)
Due to her note of 15 headaches / mo she is not eligible for nurtec for abortive therapy.

## 2023-05-25 NOTE — Progress Notes (Deleted)
No chief complaint on file.   HISTORY OF PRESENT ILLNESS:  05/25/23 ALL:  Christine Beard returns for follow up for migraines. She was last seen 11/2022 and migraines were well managed on Qulipta and Nurtec. She was having more tension style headaches thought to be related to elevated BP. She was seeing PCP and amlodipine dose had recently been increased. Since,   She continues atorvastatin and asa.   11/11/2022 ALL: Christine Beard returns for follow up for migraines and history of CVA. We saw her last 04/2022 and switched Emgality to [place] for better coverage. Nurtec and metoclopramide used for abortive therapy. She reports headaches were better for about 5-6 months. Averaging 3-4 headache days a month, down from 8-10. She has not taken Nurtec recently.   She reports having daily headaches over the past 2 weeks. She reached out to Dr Odis Hollingshead, cardiology, 11/09/2022 reporting elevated BP readings. Amlodipine was increased to 5mg  daily. She increased dose yesterday. She reports feeling dizzy and like she was going to pass out two different occasions, yesterday. She did not check BP. She has felt better today but has a headache. She is drinking about 24-30 ounces of water daily. She has a history of vertigo.   She continues close follow up with PCP. She is taking asa 81mg  and atorvastatin 20mg  daily. No stroke like symptoms.   Medications tried that can be used in migraine management: Qulipta, Emgality, amlodipine, citalopram, fluoxetine, labetalol, propranolol, sertraline, ibuprofen, [name], stadol, benadryl, Toradol, magnesium, meclizine, metoclopramide, naproxen, zofran, phenergan, tramadol  04/29/2022 ALL: Christine Beard is a 32 y.o. female here today for follow up for  migraines and previous CVA. She was seen by Dr Lucia Gaskins 12/2021 and started on Emgality. She reports that headaches are significantly better. She does feel that Emgality wears off after three weeks. She also reports pain with injections. She  reports having 8-12 headache days with 5-7 migraines per month. She usually takes ondansetron or metoclopramide for abortive therapy.   She continues atorvastatin 20mg  and asa 81mg  daily. She is followed closely by PCP. She was diagnosed with Noonan Syndrome. She had TEE 04/27/2022 that showed EF 60-65% and was positive with shunting observed after >6 cardiac cycles suggestive of intrapulmonary shunting. There is no evidence of a patent foramen ovale. There is no evidence of an atrial septal defect. She is followed closely by PCP and cardiology. BP . She quit smoking 4-5 years ago.   HISTORY (copied from Dr Trevor Mace previous note)  12/16/2021: Here for follow up of migraines. The Topiramate is not working. She had tubal ligation no chance of having children. Having daily migraines.  Discussed  the multiple options that she has, at this time she has failed multiple medications. She has daily headaches and > 15 migraine days a month, unilateral, pulsating pounding, nausea, lasting up to 24 hours, photophobia and phonophobia.  We discussed other oral traditional medications, we discussed the new class of CGRP such as Aimovig, Ajovy, Emgality, we also discussed atogepant and we decided on Emgality.  We discussed migraine management, lifestyle factors, preventative and acute management.  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   MRI brain  12/05/2020: personally reviewed and agree with findings IMPRESSION: This MRI of the brain with and without contrast shows the following: 1.   4 mm T2 hyperintense focus in the left  thalamus consistent with a small focus of gliosis or lacunar infarction.  Though more subtle on CT scan, it appears to be present on the 10/31/2018 images. 2.   Several punctate T2/flair hyperintense foci in the subcortical white matter.  This is nonspecific and could be the sequela of migraine or chronic ischemic change. 3.   Normal enhancement pattern.  No acute findings.   CTA head(reviewed reports) unremarkable   (Additional 20 minutes reviewing images)   Patient complains of symptoms per HPI as well as the following symptoms: migraine,strokes . Pertinent negatives and positives per HPI. All others negative   HPI:  Christine Beard is a 32 y.o. female here as requested by Lance Bosch, NP for daily headaches for one year --- actually most of her life. PMHx thyroid disease, SVD, migraine, hypothyroidism, headache, depression, anxiety. She has had migraines for years. Worsened after her second child when she had a scare with her children and her child almost fell down the stairs and she had acute right-sided weakness and a headache in 2019. One day she had ringing in her right ear and ever since then she has daily headaches and migraines, very bad migraines, dizziness, she takes daily OTC medications. Migraines are usually on the right side, ppounding/pulsating/throbbing, photophobia/phonophobia, nausea all the time, she has vomited, the migraine and headache last all day long 24 hours and are daily and moderate to severe pain. Lots of stress at home too. She gets so dizzy she feels she is going to faint. She has vision changes, headaches are worse positionally esp in the morning.  No more children, had tubal ligation. No other focal neurologic deficits, associated symptoms, inciting events or modifiable factors.   Reviewed notes, labs and imaging from outside physicians, which showed:   I tried to review primary care notes, they are handwritten and difficult to accurately interpret but it  appears as though head, eyes, ears, nose, throat were normal, pupils equally round reactive to light, cardiovascularly normal, respiratory lungs clear to auscultation, neuro remote and recent memory intact alert and oriented x4, paresthesias, she has headaches and migraines, stressors versus psych follow-up as indicated, referral to neurology as well as [facility], she was prescribed meclizine for vertigo and dizziness   Echo cardiogram normal : reviewed report   From a thorough review of records, Medications tried that can be used in migraine management: tylenol, amlodipine, ibuprofen,fioricet, stadol, celexa, benadryl, prozac, ibuprofen, toradol, labetalol, magnesium, meclizine, reglan, naproxen, zofran, phenergan, propranolol, zoloft, tramadol,    CT head 10/31/2018: Ct showed No acute intracranial abnormalities including mass lesion or mass effect, hydrocephalus, extra-axial fluid collection, midline shift, hemorrhage, or acute infarction, large ischemic events (personally reviewed images)   06/11/2015: MR MRV   FINDINGS: Superior sagittal sinus, internal cerebral veins, vein of Galen, straight sinus, right transverse sinus, right sigmoid sinus, and jugular bulbs are patent without evidence of thrombus. Apparent lack of flow in the proximal right transverse sinus on MIP images is artifactual, without evidence of thrombus on the source images.   The left transverse and left sigmoid sinuses are hypoplastic with areas of signal dropout felt to be artifactual without discrete filling defects seen on source images to indicate thrombus.   IMPRESSION: No evidence of dural venous sinus thrombosis. 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  REVIEW OF SYSTEMS: Out of a complete 14 system review of symptoms, the patient complains only of the following symptoms, headaches and all other reviewed systems are negative.   ALLERGIES: Allergies  Allergen Reactions   Cinnamon  Itching and Other (See Comments)   Codeine Nausea And Vomiting and Other (See Comments)     HOME MEDICATIONS: Outpatient Medications Prior to Visit  Medication Sig Dispense Refill   amLODipine (NORVASC) 5 MG tablet Take 0.5 tablets (2.5 mg total) by mouth daily. (Patient not taking: Reported on 03/10/2023) 90 tablet 0   aspirin EC ([employer] ASPIRIN LOW DOSE) 81 MG tablet Take 1 tablet (81 mg total) by mouth daily. SWALLOW WHOLE. 90 tablet 4   Atogepant (QULIPTA) 60 MG TABS Take 1 tablet (60 mg total) by mouth daily. 90 tablet 3   atorvastatin (LIPITOR) 20 MG tablet TAKE 1 TABLET BY MOUTH EVERY DAY 90 tablet 3   Biotin 5 MG CAPS Take 5 mg by mouth daily.     buPROPion (WELLBUTRIN XL) 300 MG 24 hr tablet Take 300 mg by mouth daily.     busPIRone (BUSPAR) 15 MG tablet Take 15 mg by mouth daily.     calcium carbonate (OS-CAL) 600 MG TABS tablet Take 1,200 mg of elemental calcium by mouth daily.     cholecalciferol (VITAMIN D3) 25 MCG (1000 UNIT) tablet Take 1,000 Units by mouth daily.     ELDERBERRY PO Take 1 tablet by mouth daily.     EPINEPHrine 0.3 mg/0.3 mL IJ SOAJ injection Inject 0.3 mg into the muscle as needed. (Patient not taking: Reported on 01/31/2023)     fluticasone (VERAMYST) 27.5 MCG/SPRAY nasal spray Place 2 sprays into the nose daily.     Fluvoxamine Maleate 150 MG CP24 Take 300 mg by mouth at bedtime.     ibuprofen (ADVIL) 200 MG tablet Take 800 mg by mouth every 8 (eight) hours as needed for cramping or moderate pain.     levothyroxine (SYNTHROID) 25 MCG tablet Take 25 mcg by mouth daily before breakfast.     loratadine (CLARITIN) 10 MG tablet Take 10 mg by mouth daily.     Multiple Vitamins-Minerals (ONE-A-DAY FOR HER VITACRAVES PO) Take 1 tablet by mouth daily.     Omega-3 Fatty Acids (FISH OIL) 1000 MG CAPS Take 1,000 mg by mouth daily.     omeprazole (PRILOSEC) 20 MG capsule Take 1 capsule (20 mg total) by mouth daily. 90 capsule 3   ondansetron (ZOFRAN) 8 MG tablet Take 8 mg by  mouth every 8 (eight) hours as needed for nausea/vomiting.     Rimegepant Sulfate (NURTEC) 75 MG TBDP Take 1 tablet (75 mg total) by mouth daily as needed (take for abortive therapy of migraine, no more than 1 tablet in 24 hours or 10 per month). 8 tablet 11   traZODone (DESYREL) 50 MG tablet Take 50 mg by mouth at bedtime. (Patient not taking: Reported on 03/10/2023)     vitamin B-12 (CYANOCOBALAMIN) 1000 MCG tablet Take 1,000 mcg by mouth daily.     No facility-administered medications prior to visit.     PAST MEDICAL HISTORY: Past Medical History:  Diagnosis Date   Anemia    Anxiety    Depression    GERD (gastroesophageal reflux disease)    Headache    Hyperlipidemia    Hypertension    Hypothyroidism    Migraine    Noonan syndrome    S/P tubal ligation 10/06/2017   Stroke (HCC)  SVD (spontaneous vaginal delivery) 10/05/2017   Thyroid disease      PAST SURGICAL HISTORY: Past Surgical History:  Procedure Laterality Date   BUBBLE STUDY  04/27/2022   Procedure: BUBBLE STUDY;  Surgeon: Yates Decamp, MD;  Location[facility] ENDOSCOPY;  Service: Cardiovascular;;   CHOLECYSTECTOMY N/A 04/16/2021   Procedure: LAPAROSCOPIC CHOLECYSTECTOMY;  Surgeon: Rodman Pickle, MD;  Location: [facility] ORS;  Service: General;  Laterality: N/A;   TEE WITHOUT CARDIOVERSION N/A 04/27/2022   Procedure: TRANSESOPHAGEAL ECHOCARDIOGRAM (TEE);  Surgeon: Yates Decamp, MD;  Location: [facility] ENDOSCOPY;  Service: Cardiovascular;  Laterality: N/A;   TOE SURGERY  04/2005   TONSILLECTOMY  11/2004   TUBAL LIGATION N/A 10/06/2017   Procedure: POST PARTUM TUBAL LIGATION;  Surgeon: Sherian Rein, MD;  Location: [facility]TH[facility];  Service: Gynecology;  Laterality: N/A;     FAMILY HISTORY: Family History  Problem Relation Age of Onset   High blood pressure Maternal Grandmother    Cancer Maternal Grandmother        "they removed it so she's fine"   Diabetes Paternal Grandmother    Cancer Paternal Grandmother     Heart attack Father    Migraines Father    Heart attack Paternal Grandfather      SOCIAL HISTORY: Social History   Socioeconomic History   Marital status: Significant Other    Spouse name: Not on file   Number of children: 2   Years of education: Not on file   Highest education level: GED or equivalent  Occupational History   Occupation: homemaker  Tobacco Use   Smoking status: Former    Current packs/day: 0.00    Types: Cigarettes    Quit date: 10/03/2020    Years since quitting: 2.6   Smokeless tobacco: Never  Vaping Use   Vaping status: Never Used  Substance and Sexual Activity   Alcohol use: Not Currently   Drug use: Never   Sexual activity: Yes    Birth control/protection: Surgical    Comment: tubal ligation  Other Topics Concern   Not on file  Social History Narrative   Lives at home with fiance and 2 children   Right handed   Caffeine: "a lot of pepsi" but also tries to drink a lot of water as well   Social Determinants of Corporate investment banker Strain: Not on file  Food Insecurity: Not on file  Transportation Needs: Not on file  Physical Activity: Not on file  Stress: Not on file  Social Connections: Unknown (02/01/2022)   Received from [facility]   Social Network    Social Network: Not on file  Intimate Partner Violence: Unknown (01/07/2022)   Received from [facility]   HITS    Physically Hurt: Not on file    Insult or Talk Down To: Not on file    Threaten Physical Harm: Not on file    Scream or Curse: Not on file     PHYSICAL EXAM  There were no vitals filed for this visit.   There is no height or weight on file to calculate BMI.  Generalized: Well developed, in no acute distress  Cardiology: normal rate and rhythm, no murmur auscultated  Respiratory: clear to auscultation bilaterally    Neurological examination  Mentation: Alert oriented to time, place, history taking. Follows all commands speech and language fluent Cranial  nerve II-XII: Pupils were equal round reactive to light. Extraocular movements were full, visual field were full on confrontational test. Facial sensation and  strength were normal. Head turning and shoulder shrug  were normal and symmetric. Motor: The motor testing reveals 5 over 5 strength of all 4 extremities. Good symmetric motor tone is noted throughout.  Gait and station: Gait is normal. Tandem gait is normal.    DIAGNOSTIC DATA (LABS, IMAGING, TESTING) - I reviewed patient records, labs, notes, testing and imaging myself where available.  Lab Results  Component Value Date   WBC 13.1 (H) 12/10/2022   HGB 12.3 12/10/2022   HCT 38.8 12/10/2022   MCV 92.2 12/10/2022   PLT 244 12/10/2022      Component Value Date/Time   NA 136 12/10/2022 1625   NA 140 11/28/2022 1338   K 3.5 12/10/2022 1625   CL 105 12/10/2022 1625   CO2 22 12/10/2022 1625   GLUCOSE 100 (H) 12/10/2022 1625   BUN 6 12/10/2022 1625   BUN 6 11/28/2022 1338   CREATININE 0.79 12/10/2022 1625   CALCIUM 9.0 12/10/2022 1625   PROT 6.6 12/10/2022 1625   PROT 6.4 11/28/2022 1338   ALBUMIN 3.5 12/10/2022 1625   ALBUMIN 3.8 (L) 11/28/2022 1338   AST 12 (L) 12/10/2022 1625   ALT 12 12/10/2022 1625   ALKPHOS 81 12/10/2022 1625   BILITOT 0.9 12/10/2022 1625   BILITOT 0.2 11/28/2022 1338   GFRNONAA >60 12/10/2022 1625   GFRAA >60 10/28/2018 0646   Lab Results  Component Value Date   CHOL 193 12/10/2020   HDL 79 12/10/2020   LDLCALC 106 (H) 12/10/2020   TRIG 38 12/10/2020   CHOLHDL 2.4 12/10/2020   Lab Results  Component Value Date   HGBA1C 4.4 (L) 12/10/2020   Lab Results  Component Value Date   VITAMINB12 >1500 (H) 01/24/2023   Lab Results  Component Value Date   TSH 0.916 11/28/2022        No data to display               No data to display           ASSESSMENT AND PLAN  32 y.o. year old female  has a past medical history of Anemia, Anxiety, Depression, GERD (gastroesophageal reflux  disease), Headache, Hyperlipidemia, Hypertension, Hypothyroidism, Migraine, Noonan syndrome, S/P tubal ligation (10/06/2017), Stroke (HCC), SVD (spontaneous vaginal delivery) (10/05/2017), and Thyroid disease. here with    Cryptogenic stroke (HCC)  Lacunar stroke (HCC)  Chronic intractable headache, unspecified headache type  Chronic migraine without aura without status migrainosus, not intractable  [name] reports headaches are significantly better on Qulipta 60mg  daily. Nurtec continued for abortive therapy. She can not take triptans due to history of CVA. She has had more headaches over the past two weeks but also reports elevated BP readings. She will monitor BP and headaches for the next two weeks. If BP well controlled and migraines continue, she will call and we can consider restarting oral preventative. Healthy lifestyle habits encouraged. She will follow up with PCP and cardiology as directed. Continue asa and rosuvastatin. She will return to see me in 6 months, sooner if needed. She verbalizes understanding and agreement with this plan.    No orders of the defined types were placed in this encounter.    No orders of the defined types were placed in this encounter.    Shawnie Dapper, MSN, FNP-C 05/25/2023, 11:37 AM  [facility] [address] [place], [place] [address] [phone]

## 2023-05-26 ENCOUNTER — Ambulatory Visit: Payer: Medicaid Other | Admitting: Family Medicine

## 2023-05-26 ENCOUNTER — Encounter: Payer: Self-pay | Admitting: Family Medicine

## 2023-05-26 DIAGNOSIS — G8929 Other chronic pain: Secondary | ICD-10-CM

## 2023-05-26 DIAGNOSIS — I639 Cerebral infarction, unspecified: Secondary | ICD-10-CM

## 2023-05-26 DIAGNOSIS — G43709 Chronic migraine without aura, not intractable, without status migrainosus: Secondary | ICD-10-CM

## 2023-05-26 DIAGNOSIS — I6381 Other cerebral infarction due to occlusion or stenosis of small artery: Secondary | ICD-10-CM

## 2023-06-14 NOTE — Progress Notes (Unsigned)
No chief complaint on file.   HISTORY OF PRESENT ILLNESS:  06/14/23 ALL:  Christine Beard returns for follow up for migraines. She was last seen 11/2022 and migraines were well managed on Qulipta and Nurtec. She was having more tension style headaches thought to be related to elevated BP. She was seeing PCP and amlodipine dose had recently been increased. Since,   She continues atorvastatin and asa.   11/11/2022 ALL: Christine Beard returns for follow up for migraines and history of CVA. We saw her last 04/2022 and switched Emgality to [place] for better coverage. Nurtec and metoclopramide used for abortive therapy. She reports headaches were better for about 5-6 months. Averaging 3-4 headache days a month, down from 8-10. She has not taken Nurtec recently.   She reports having daily headaches over the past 2 weeks. She reached out to Dr Odis Hollingshead, cardiology, 11/09/2022 reporting elevated BP readings. Amlodipine was increased to 5mg  daily. She increased dose yesterday. She reports feeling dizzy and like she was going to pass out two different occasions, yesterday. She did not check BP. She has felt better today but has a headache. She is drinking about 24-30 ounces of water daily. She has a history of vertigo.   She continues close follow up with PCP. She is taking asa 81mg  and atorvastatin 20mg  daily. No stroke like symptoms.   Medications tried that can be used in migraine management: Qulipta, Emgality, amlodipine, citalopram, fluoxetine, labetalol, propranolol, sertraline, ibuprofen, [name], stadol, benadryl, Toradol, magnesium, meclizine, metoclopramide, naproxen, zofran, phenergan, tramadol  04/29/2022 ALL: Christine Beard is a 32 y.o. female here today for follow up for  migraines and previous CVA. She was seen by Dr Lucia Gaskins 12/2021 and started on Emgality. She reports that headaches are significantly better. She does feel that Emgality wears off after three weeks. She also reports pain with injections. She  reports having 8-12 headache days with 5-7 migraines per month. She usually takes ondansetron or metoclopramide for abortive therapy.   She continues atorvastatin 20mg  and asa 81mg  daily. She is followed closely by PCP. She was diagnosed with Noonan Syndrome. She had TEE 04/27/2022 that showed EF 60-65% and was positive with shunting observed after >6 cardiac cycles suggestive of intrapulmonary shunting. There is no evidence of a patent foramen ovale. There is no evidence of an atrial septal defect. She is followed closely by PCP and cardiology. BP . She quit smoking 4-5 years ago.   HISTORY (copied from Dr Trevor Mace previous note)  12/16/2021: Here for follow up of migraines. The Topiramate is not working. She had tubal ligation no chance of having children. Having daily migraines.  Discussed  the multiple options that she has, at this time she has failed multiple medications. She has daily headaches and > 15 migraine days a month, unilateral, pulsating pounding, nausea, lasting up to 24 hours, photophobia and phonophobia.  We discussed other oral traditional medications, we discussed the new class of CGRP such as Aimovig, Ajovy, Emgality, we also discussed atogepant and we decided on Emgality.  We discussed migraine management, lifestyle factors, preventative and acute management.  I also encouraged her not to miss her daily aspirin, she had an extensive Beard-up for stroke after finding a left thalamic infarct of cryptogenic etiology, extensive blood Beard only showed mildly elevated IgM anticardiolipin antibody of unclear significance in the absence of other clinical symptomatology of antiphospholipid antibody syndrome, her vascular risk factors are mild hyperlipidemia and smoking and we discussed that again today.   MRI brain  12/05/2020: personally reviewed and agree with findings IMPRESSION: This MRI of the brain with and without contrast shows the following: 1.   4 mm T2 hyperintense focus in the left  thalamus consistent with a small focus of gliosis or lacunar infarction.  Though more subtle on CT scan, it appears to be present on the 10/31/2018 images. 2.   Several punctate T2/flair hyperintense foci in the subcortical white matter.  This is nonspecific and could be the sequela of migraine or chronic ischemic change. 3.   Normal enhancement pattern.  No acute findings.   CTA head(reviewed reports) unremarkable   (Additional 20 minutes reviewing images)   Patient complains of symptoms per HPI as well as the following symptoms: migraine,strokes . Pertinent negatives and positives per HPI. All others negative   HPI:  Christine Beard is a 32 y.o. female here as requested by Lance Bosch, NP for daily headaches for one year --- actually most of her life. PMHx thyroid disease, SVD, migraine, hypothyroidism, headache, depression, anxiety. She has had migraines for years. Worsened after her second child when she had a scare with her children and her child almost fell down the stairs and she had acute right-sided weakness and a headache in 2019. One day she had ringing in her right ear and ever since then she has daily headaches and migraines, very bad migraines, dizziness, she takes daily OTC medications. Migraines are usually on the right side, ppounding/pulsating/throbbing, photophobia/phonophobia, nausea all the time, she has vomited, the migraine and headache last all day long 24 hours and are daily and moderate to severe pain. Lots of stress at home too. She gets so dizzy she feels she is going to faint. She has vision changes, headaches are worse positionally esp in the morning.  No more children, had tubal ligation. No other focal neurologic deficits, associated symptoms, inciting events or modifiable factors.   Reviewed notes, labs and imaging from outside physicians, which showed:   I tried to review primary care notes, they are handwritten and difficult to accurately interpret but it  appears as though head, eyes, ears, nose, throat were normal, pupils equally round reactive to light, cardiovascularly normal, respiratory lungs clear to auscultation, neuro remote and recent memory intact alert and oriented x4, paresthesias, she has headaches and migraines, stressors versus psych follow-up as indicated, referral to neurology as well as [facility], she was prescribed meclizine for vertigo and dizziness   Echo cardiogram normal : reviewed report   From a thorough review of records, Medications tried that can be used in migraine management: tylenol, amlodipine, ibuprofen,fioricet, stadol, celexa, benadryl, prozac, ibuprofen, toradol, labetalol, magnesium, meclizine, reglan, naproxen, zofran, phenergan, propranolol, zoloft, tramadol,    CT head 10/31/2018: Ct showed No acute intracranial abnormalities including mass lesion or mass effect, hydrocephalus, extra-axial fluid collection, midline shift, hemorrhage, or acute infarction, large ischemic events (personally reviewed images)   06/11/2015: MR MRV   FINDINGS: Superior sagittal sinus, internal cerebral veins, vein of Galen, straight sinus, right transverse sinus, right sigmoid sinus, and jugular bulbs are patent without evidence of thrombus. Apparent lack of flow in the proximal right transverse sinus on MIP images is artifactual, without evidence of thrombus on the source images.   The left transverse and left sigmoid sinuses are hypoplastic with areas of signal dropout felt to be artifactual without discrete filling defects seen on source images to indicate thrombus.   IMPRESSION: No evidence of dural venous sinus thrombosis. 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  REVIEW OF SYSTEMS: Out of a complete 14 system review of symptoms, the patient complains only of the following symptoms, headaches and all other reviewed systems are negative.   ALLERGIES: Allergies  Allergen Reactions   Cinnamon  Itching and Other (See Comments)   Codeine Nausea And Vomiting and Other (See Comments)     HOME MEDICATIONS: Outpatient Medications Prior to Visit  Medication Sig Dispense Refill   amLODipine (NORVASC) 5 MG tablet Take 0.5 tablets (2.5 mg total) by mouth daily. (Patient not taking: Reported on 03/10/2023) 90 tablet 0   aspirin EC ([employer] ASPIRIN LOW DOSE) 81 MG tablet Take 1 tablet (81 mg total) by mouth daily. SWALLOW WHOLE. 90 tablet 4   Atogepant (QULIPTA) 60 MG TABS Take 1 tablet (60 mg total) by mouth daily. 90 tablet 3   atorvastatin (LIPITOR) 20 MG tablet TAKE 1 TABLET BY MOUTH EVERY DAY 90 tablet 3   Biotin 5 MG CAPS Take 5 mg by mouth daily.     buPROPion (WELLBUTRIN XL) 300 MG 24 hr tablet Take 300 mg by mouth daily.     busPIRone (BUSPAR) 15 MG tablet Take 15 mg by mouth daily.     calcium carbonate (OS-CAL) 600 MG TABS tablet Take 1,200 mg of elemental calcium by mouth daily.     cholecalciferol (VITAMIN D3) 25 MCG (1000 UNIT) tablet Take 1,000 Units by mouth daily.     ELDERBERRY PO Take 1 tablet by mouth daily.     EPINEPHrine 0.3 mg/0.3 mL IJ SOAJ injection Inject 0.3 mg into the muscle as needed. (Patient not taking: Reported on 01/31/2023)     fluticasone (VERAMYST) 27.5 MCG/SPRAY nasal spray Place 2 sprays into the nose daily.     Fluvoxamine Maleate 150 MG CP24 Take 300 mg by mouth at bedtime.     ibuprofen (ADVIL) 200 MG tablet Take 800 mg by mouth every 8 (eight) hours as needed for cramping or moderate pain.     levothyroxine (SYNTHROID) 25 MCG tablet Take 25 mcg by mouth daily before breakfast.     loratadine (CLARITIN) 10 MG tablet Take 10 mg by mouth daily.     Multiple Vitamins-Minerals (ONE-A-DAY FOR HER VITACRAVES PO) Take 1 tablet by mouth daily.     Omega-3 Fatty Acids (FISH OIL) 1000 MG CAPS Take 1,000 mg by mouth daily.     omeprazole (PRILOSEC) 20 MG capsule Take 1 capsule (20 mg total) by mouth daily. 90 capsule 3   ondansetron (ZOFRAN) 8 MG tablet Take 8 mg by  mouth every 8 (eight) hours as needed for nausea/vomiting.     Rimegepant Sulfate (NURTEC) 75 MG TBDP Take 1 tablet (75 mg total) by mouth daily as needed (take for abortive therapy of migraine, no more than 1 tablet in 24 hours or 10 per month). 8 tablet 11   traZODone (DESYREL) 50 MG tablet Take 50 mg by mouth at bedtime. (Patient not taking: Reported on 03/10/2023)     vitamin B-12 (CYANOCOBALAMIN) 1000 MCG tablet Take 1,000 mcg by mouth daily.     No facility-administered medications prior to visit.     PAST MEDICAL HISTORY: Past Medical History:  Diagnosis Date   Anemia    Anxiety    Depression    GERD (gastroesophageal reflux disease)    Headache    Hyperlipidemia    Hypertension    Hypothyroidism    Migraine    Noonan syndrome    S/P tubal ligation 10/06/2017   Stroke (HCC)  SVD (spontaneous vaginal delivery) 10/05/2017   Thyroid disease      PAST SURGICAL HISTORY: Past Surgical History:  Procedure Laterality Date   BUBBLE STUDY  04/27/2022   Procedure: BUBBLE STUDY;  Surgeon: Yates Decamp, MD;  Location: [facility] ENDOSCOPY;  Service: Cardiovascular;;   CHOLECYSTECTOMY N/A 04/16/2021   Procedure: LAPAROSCOPIC CHOLECYSTECTOMY;  Surgeon: Rodman Pickle, MD;  Location: [facility] ORS;  Service: General;  Laterality: N/A;   TEE WITHOUT CARDIOVERSION N/A 04/27/2022   Procedure: TRANSESOPHAGEAL ECHOCARDIOGRAM (TEE);  Surgeon: Yates Decamp, MD;  Location: [facility] ENDOSCOPY;  Service: Cardiovascular;  Laterality: N/A;   TOE SURGERY  04/2005   TONSILLECTOMY  11/2004   TUBAL LIGATION N/A 10/06/2017   Procedure: POST PARTUM TUBAL LIGATION;  Surgeon: Sherian Rein, MD;  Location: [facility];  Service: Gynecology;  Laterality: N/A;     FAMILY HISTORY: Family History  Problem Relation Age of Onset   High blood pressure Maternal Grandmother    Cancer Maternal Grandmother        "they removed it so she's fine"   Diabetes Paternal Grandmother    Cancer Paternal Grandmother     Heart attack Father    Migraines Father    Heart attack Paternal Grandfather      SOCIAL HISTORY: Social History   Socioeconomic History   Marital status: Significant Other    Spouse name: Not on file   Number of children: 2   Years of education: Not on file   Highest education level: GED or equivalent  Occupational History   Occupation: homemaker  Tobacco Use   Smoking status: Former    Current packs/day: 0.00    Types: Cigarettes    Quit date: 10/03/2020    Years since quitting: 2.6   Smokeless tobacco: Never  Vaping Use   Vaping status: Never Used  Substance and Sexual Activity   Alcohol use: Not Currently   Drug use: Never   Sexual activity: Yes    Birth control/protection: Surgical    Comment: tubal ligation  Other Topics Concern   Not on file  Social History Narrative   Lives at home with fiance and 2 children   Right handed   Caffeine: "a lot of pepsi" but also tries to drink a lot of water as well   Social Determinants of Corporate investment banker Strain: Not on file  Food Insecurity: Not on file  Transportation Needs: Not on file  Physical Activity: Not on file  Stress: Not on file  Social Connections: Unknown (02/01/2022)   [facility]   Social Network    Social Network: Not on file  Intimate Partner Violence: Unknown (01/07/2022)   Received from [facility]   HITS    Physically Hurt: Not on file    Insult or Talk Down To: Not on file    Threaten Physical Harm: Not on file    Scream or Curse: Not on file     PHYSICAL EXAM  There were no vitals filed for this visit.   There is no height or weight on file to calculate BMI.  Generalized: Well developed, in no acute distress  Cardiology: normal rate and rhythm, no murmur auscultated  Respiratory: clear to auscultation bilaterally    Neurological examination  Mentation: Alert oriented to time, place, history taking. Follows all commands speech and language fluent Cranial  nerve II-XII: Pupils were equal round reactive to light. Extraocular movements were full, visual field were full on confrontational test. Facial sensation and  strength were normal. Head turning and shoulder shrug  were normal and symmetric. Motor: The motor testing reveals 5 over 5 strength of all 4 extremities. Good symmetric motor tone is noted throughout.  Gait and station: Gait is normal. Tandem gait is normal.    DIAGNOSTIC DATA (LABS, IMAGING, TESTING) - I reviewed patient records, labs, notes, testing and imaging myself where available.  Lab Results  Component Value Date   WBC 13.1 (H) 12/10/2022   HGB 12.3 12/10/2022   HCT 38.8 12/10/2022   MCV 92.2 12/10/2022   PLT 244 12/10/2022      Component Value Date/Time   NA 136 12/10/2022 1625   NA 140 11/28/2022 1338   K 3.5 12/10/2022 1625   CL 105 12/10/2022 1625   CO2 22 12/10/2022 1625   GLUCOSE 100 (H) 12/10/2022 1625   BUN 6 12/10/2022 1625   BUN 6 11/28/2022 1338   CREATININE 0.79 12/10/2022 1625   CALCIUM 9.0 12/10/2022 1625   PROT 6.6 12/10/2022 1625   PROT 6.4 11/28/2022 1338   ALBUMIN 3.5 12/10/2022 1625   ALBUMIN 3.8 (L) 11/28/2022 1338   AST 12 (L) 12/10/2022 1625   ALT 12 12/10/2022 1625   ALKPHOS 81 12/10/2022 1625   BILITOT 0.9 12/10/2022 1625   BILITOT 0.2 11/28/2022 1338   GFRNONAA >60 12/10/2022 1625   GFRAA >60 10/28/2018 0646   Lab Results  Component Value Date   CHOL 193 12/10/2020   HDL 79 12/10/2020   LDLCALC 106 (H) 12/10/2020   TRIG 38 12/10/2020   CHOLHDL 2.4 12/10/2020   Lab Results  Component Value Date   HGBA1C 4.4 (L) 12/10/2020   Lab Results  Component Value Date   VITAMINB12 >1500 (H) 01/24/2023   Lab Results  Component Value Date   TSH 0.916 11/28/2022        No data to display               No data to display           ASSESSMENT AND PLAN  33 y.o. year old female  has a past medical history of Anemia, Anxiety, Depression, GERD (gastroesophageal reflux  disease), Headache, Hyperlipidemia, Hypertension, Hypothyroidism, Migraine, Noonan syndrome, S/P tubal ligation (10/06/2017), Stroke (HCC), SVD (spontaneous vaginal delivery) (10/05/2017), and Thyroid disease. here with    Lacunar stroke (HCC)  Chronic intractable headache, unspecified headache type  [name] reports headaches are significantly better on Qulipta 60mg  daily. Nurtec continued for abortive therapy. She can not take triptans due to history of CVA. She has had more headaches over the past two weeks but also reports elevated BP readings. She will monitor BP and headaches for the next two weeks. If BP well controlled and migraines continue, she will call and we can consider restarting oral preventative. Healthy lifestyle habits encouraged. She will follow up with PCP and cardiology as directed. Continue asa and rosuvastatin. She will return to see me in 6 months, sooner if needed. She verbalizes understanding and agreement with this plan.    No orders of the defined types were placed in this encounter.    No orders of the defined types were placed in this encounter.    Shawnie Dapper, MSN, FNP-C 06/14/2023, 8:22 AM  [facility] [address] [place], [place] [address] [phone]

## 2023-06-14 NOTE — Patient Instructions (Incomplete)
Below is our plan:  We will continue Qulipta and Nurtec. Please continue close follow up with your care team. Update your eye exam.   Goals:  1) Maintain strict control of hypertension with blood pressure goal below 130/90 2) Maintain good control of diabetes with hemoglobin A1c goal below 7%  3) Maintain good control of lipids with LDL cholesterol goal below 70 mg/dL.  4) Eat a healthy diet with plenty of whole grains, cereals, fruits and vegetables, exercise regularly and maintain ideal body weight   Resources: https://www.williams.biz/  Please make sure you are staying well hydrated. I recommend 50-60 ounces daily. Well balanced diet and regular exercise encouraged. Consistent sleep schedule with 6-8 hours recommended.   To prevent or relieve headaches, try the following:    GENERAL HEADACHE INFORMATION:   Natural supplements: Magnesium Oxide or Magnesium Glycinate 500 mg at bed (up to 800 mg daily) Coenzyme Q10 300 mg in AM Vitamin B2- 200 mg twice a day   Add 1 supplement at a time since even natural supplements can have undesirable side effects. You can sometimes buy supplements cheaper (especially Coenzyme Q10) at www.WebmailGuide.co.za or at [facility].  Migraine with aura: There is increased risk for stroke in women with migraine with aura and a contraindication for the combined contraceptive pill for use by women who have migraine with aura. The risk for women with migraine without aura is lower. However other risk factors like smoking are far more likely to increase stroke risk than migraine. There is a recommendation for no smoking and for the use of OCPs without estrogen such as progestogen only pills particularly for women with migraine with aura.Marland Kitchen People who have migraine headaches with auras may be 3 times more likely to have a stroke caused by a blood clot, compared to migraine patients who don't see auras. Women who  take hormone-replacement therapy may be 30 percent more likely to suffer a clot-based stroke than women not taking medication containing estrogen. Other risk factors like smoking and high blood pressure may be  much more important.    Vitamins and herbs that show potential:   Magnesium: Magnesium (250 mg twice a day or 500 mg at bed) has a relaxant effect on smooth muscles such as blood vessels. Individuals suffering from frequent or daily headache usually have low magnesium levels which can be increase with daily supplementation of 400-750 mg. Three trials found 40-90% average headache reduction  when used as a preventative. Magnesium may help with headaches are aura, the best evidence for magnesium is for migraine with aura is its thought to stop the cortical spreading depression we believe is the pathophysiology of migraine aura.Magnesium also demonstrated the benefit in menstrually related migraine.  Magnesium is part of the messenger system in the serotonin cascade and it is a good muscle relaxant.  It is also useful for constipation which can be a side effect of other medications used to treat migraine. Good sources include nuts, whole grains, and tomatoes. Side Effects: loose stool/diarrhea  Riboflavin (vitamin B 2) 200 mg twice a day. This vitamin assists nerve cells in the production of ATP a principal energy storing molecule.  It is necessary for many chemical reactions in the body.  There have been at least 3 clinical trials of riboflavin using 400 mg per day all of which suggested that migraine frequency can be decreased.  All 3 trials showed significant improvement in over half of migraine sufferers.  The supplement is found in bread, cereal, milk, meat,  and poultry.  Most Americans get more riboflavin than the recommended daily allowance, however riboflavin deficiency is not necessary for the supplements to help prevent headache. Side effects: energizing, green urine   Coenzyme Q10: This is  present in almost all cells in the body and is critical component for the conversion of energy.  Recent studies have shown that a nutritional supplement of CoQ10 can reduce the frequency of migraine attacks by improving the energy production of cells as with riboflavin.  Doses of 150 mg twice a day have been shown to be effective.   Melatonin: Increasing evidence shows correlation between melatonin secretion and headache conditions.  Melatonin supplementation has decreased headache intensity and duration.  It is widely used as a sleep aid.  Sleep is natures way of dealing with migraine.  A dose of 3 mg is recommended to start for headaches including cluster headache. Higher doses up to 15 mg has been reviewed for use in Cluster headache and have been used. The rationale behind using melatonin for cluster is that many theories regarding the cause of Cluster headache center around the disruption of the normal circadian rhythm in the brain.  This helps restore the normal circadian rhythm.   HEADACHE DIET: Foods and beverages which may trigger migraine Note that only 20% of headache patients are food sensitive. You will know if you are food sensitive if you get a headache consistently 20 minutes to 2 hours after eating a certain food. Only cut out a food if it causes headaches, otherwise you might remove foods you enjoy! What matters most for diet is to eat a well balanced healthy diet full of vegetables and low fat protein, and to not miss meals.   Chocolate, other sweets ALL cheeses except cottage and cream cheese Dairy products, yogurt, sour cream, ice cream Liver Meat extracts (Bovril, Marmite, meat tenderizers) Meats or fish which have undergone aging, fermenting, pickling or smoking. These include: Hotdogs,salami,Lox,sausage, mortadellas,smoked salmon, pepperoni, Pickled herring Pods of broad bean (English beans, Chinese pea pods, [place] (fava) beans, lima and navy beans Ripe avocado, ripe  banana Yeast extracts or active yeast preparations such as Brewer's or Fleishman's (commercial bakes goods are permitted) Tomato based foods, pizza (lasagna, etc.)   MSG (monosodium glutamate) is disguised as many things; look for these common aliases: Monopotassium glutamate Autolysed yeast Hydrolysed protein Sodium caseinate "flavorings" "all natural preservatives" Nutrasweet   Avoid all other foods that convincingly provoke headaches.   Resources: The Dizzy [place]air Laundry Your Headache Diet, migrainestrong.com  https://zamora-andrews.com/   Caffeine and Migraine For patients that have migraine, caffeine intake more than 3 days per week can lead to dependency and increased migraine frequency. I would recommend cutting back on your caffeine intake as best you can. The recommended amount of caffeine is 200-300 mg daily, although migraine patients may experience dependency at even lower doses. While you may notice an increase in headache temporarily, cutting back will be helpful for headaches in the long run. For more information on caffeine and migraine, visit: https://americanmigrainefoundation.org/resource-library/caffeine-and-migraine/   Headache Prevention Strategies:   1. Maintain a headache diary; learn to identify and avoid triggers.  - This can be a simple note where you log when you had a headache, associated symptoms, and medications used - There are several smartphone apps developed to help track migraines: Migraine Buddy, Migraine Monitor, Curelator N1-Headache App   Common triggers include: Emotional triggers: Emotional/Upset family or friends Emotional/Upset occupation Business reversal/success Anticipation anxiety Crisis-serious Post-crisis periodNew job/position   Physical  triggers: Vacation Day Weekend Strenuous Exercise High Altitude Location New Move Menstrual Day Physical Illness Oversleep/Not enough  sleep Weather changes Light: Photophobia or light sesnitivity treatment involves a balance between desensitization and reduction in overly strong input. Use dark polarized glasses outside, but not inside. Avoid bright or fluorescent light, but do not dim environment to the point that going into a normally lit room hurts. Consider FL-41 tint lenses, which reduce the most irritating wavelengths without blocking too much light.  These can be obtained at axonoptics.com or theraspecs.com Foods: see list above.   2. Limit use of acute treatments (over-the-counter medications, triptans, etc.) to no more than 2 days per week or 10 days per month to prevent medication overuse headache (rebound headache).     3. Follow a regular schedule (including weekends and holidays): Don't skip meals. Eat a balanced diet. 8 hours of sleep nightly. Minimize stress. Exercise 30 minutes per day. Being overweight is associated with a 5 times increased risk of chronic migraine. Keep well hydrated and drink 6-8 glasses of water per day.   4. Initiate non-pharmacologic measures at the earliest onset of your headache. Rest and quiet environment. Relax and reduce stress. Breathe2Relax is a free app that can instruct you on    some simple relaxtion and breathing techniques. Http://Dawnbuse.com is a    free website that provides teaching videos on relaxation.  Also, there are  many apps that   can be downloaded for "mindful" relaxation.  An app called YOGA NIDRA will help walk you through mindfulness. Another app called Calm can be downloaded to give you a structured mindfulness guide with daily reminders and skill development. Headspace for guided meditation Mindfulness Based Stress Reduction Online Course: www.palousemindfulness.com Cold compresses.   5. Don't wait!! Take the maximum allowable dosage of prescribed medication at the first sign of migraine.   6. Compliance:  Take prescribed medication regularly as directed and  at the first sign of a migraine.   7. Communicate:  Call your physician when problems arise, especially if your headaches change, increase in frequency/severity, or become associated with neurological symptoms (weakness, numbness, slurred speech, etc.). Proceed to emergency room if you experience new or worsening symptoms or symptoms do not resolve, if you have new neurologic symptoms or if headache is severe, or for any concerning symptom.   8. Headache/pain management therapies: Consider various complementary methods, including medication, behavioral therapy, psychological counselling, biofeedback, massage therapy, acupuncture, dry needling, and other modalities.  Such measures may reduce the need for medications. Counseling for pain management, where patients learn to function and ignore/minimize their pain, seems to work very well.   9. Recommend changing family's attention and focus away from patient's headaches. Instead, emphasize daily activities. If first question of day is 'How are your headaches/Do you have a headache today?', then patient will constantly think about headaches, thus making them worse. Goal is to re-direct attention away from headaches, toward daily activities and other distractions.   10. Helpful Websites: www.AmericanHeadacheSociety.org PatentHood.ch www.headaches.org TightMarket.nl www.achenet.org   You may receive a survey regarding today's visit. I encourage you to leave honest feed back as I do use this information to improve patient care. Thank you for seeing me today!

## 2023-06-15 ENCOUNTER — Ambulatory Visit: Payer: Medicaid Other | Admitting: Family Medicine

## 2023-06-15 ENCOUNTER — Encounter: Payer: Self-pay | Admitting: Family Medicine

## 2023-06-15 VITALS — BP 109/67 | HR 87 | Ht <= 58 in | Wt 100.5 lb

## 2023-06-15 DIAGNOSIS — R519 Headache, unspecified: Secondary | ICD-10-CM

## 2023-06-15 DIAGNOSIS — I6381 Other cerebral infarction due to occlusion or stenosis of small artery: Secondary | ICD-10-CM | POA: Diagnosis not present

## 2023-06-15 DIAGNOSIS — G8929 Other chronic pain: Secondary | ICD-10-CM | POA: Diagnosis not present

## 2023-06-15 MED ORDER — QULIPTA 60 MG PO TABS: 1.0000 | ORAL_TABLET | Freq: Every day | ORAL | 3 refills | Status: DC

## 2023-06-15 MED ORDER — NURTEC 75 MG PO TBDP: 75.0000 mg | ORAL_TABLET | Freq: Every day | ORAL | 11 refills | Status: DC | PRN

## 2023-06-29 ENCOUNTER — Other Ambulatory Visit: Payer: Self-pay

## 2023-07-05 ENCOUNTER — Telehealth: Payer: Self-pay

## 2023-07-05 ENCOUNTER — Other Ambulatory Visit (HOSPITAL_COMMUNITY): Payer: Self-pay

## 2023-07-05 NOTE — Telephone Encounter (Signed)
*  [facility]  Pharmacy Patient Advocate Encounter   Received notification from CoverMyMeds that prior authorization for Nurtec 75MG  dispersible tablets  is required/requested.   Insurance verification completed.   The patient is insured through [facility] .   Per test claim: PA required; PA submitted to [facility] via CoverMyMeds Key/confirmation #/EOC [id number] Status is pending

## 2023-07-06 ENCOUNTER — Other Ambulatory Visit (HOSPITAL_COMMUNITY): Payer: Self-pay

## 2023-07-06 NOTE — Telephone Encounter (Signed)
Pharmacy Patient Advocate Encounter  Received notification from [facility] MEDICAID that Prior Authorization for Nurtec 75MG  dispersible tablets has been APPROVED from 07/05/2023 to 07/04/2024   PA #/Case ID/Reference #: PA Case ID #: [id number]

## 2023-07-21 ENCOUNTER — Other Ambulatory Visit (HOSPITAL_COMMUNITY): Payer: Self-pay

## 2023-07-21 ENCOUNTER — Telehealth: Payer: Self-pay

## 2023-07-21 NOTE — Telephone Encounter (Signed)
Pharmacy Patient Advocate Encounter   Received notification from CoverMyMeds that prior authorization for Nurtec 75MG  dispersible tablets is required/requested.   Insurance verification completed.   The patient is insured through [facility] MEDICAID .   Per test claim: PA required; PA submitted to [facility] MEDICAID via CoverMyMeds Key/confirmation #/EOC [id number] Status is pending

## 2023-07-22 NOTE — Telephone Encounter (Signed)
Pa was previously approved. See other phone note.

## 2023-09-02 ENCOUNTER — Other Ambulatory Visit: Payer: Self-pay | Admitting: Neurology

## 2023-09-02 ENCOUNTER — Other Ambulatory Visit: Payer: Self-pay | Admitting: Cardiology

## 2023-09-02 DIAGNOSIS — I10 Essential (primary) hypertension: Secondary | ICD-10-CM

## 2023-09-02 DIAGNOSIS — R002 Palpitations: Secondary | ICD-10-CM

## 2023-09-06 ENCOUNTER — Other Ambulatory Visit: Payer: Self-pay

## 2023-09-06 MED ORDER — ATORVASTATIN CALCIUM 20 MG PO TABS: 20.0000 mg | ORAL_TABLET | Freq: Every day | ORAL | 3 refills | Status: AC

## 2023-09-09 ENCOUNTER — Telehealth: Payer: Self-pay

## 2023-09-09 ENCOUNTER — Other Ambulatory Visit (HOSPITAL_COMMUNITY): Payer: Self-pay

## 2023-09-09 NOTE — Telephone Encounter (Signed)
Pharmacy Patient Advocate Encounter   Received notification from CoverMyMeds that prior authorization for Nurtec 75MG  dispersible tablets is required/requested.   Insurance verification completed.   The patient is insured through [facility] MEDICAID .   Per test claim: PA required; PA submitted to above mentioned insurance via CoverMyMeds Key/confirmation #/EOC [id number] Status is pending

## 2023-09-12 ENCOUNTER — Other Ambulatory Visit (HOSPITAL_COMMUNITY): Payer: Self-pay

## 2023-09-12 NOTE — Telephone Encounter (Signed)
Pharmacy Patient Advocate Encounter  Received notification from [facility] MEDICAID that Prior Authorization for Nurtec 75MG  dispersible tablets has been APPROVED from 09/09/2023 to 07/04/2024. Ran test claim, Copay is $4.00. This test claim was processed through [facility]- copay amounts may vary at other pharmacies due to pharmacy/plan contracts, or as the patient moves through the different stages of their insurance plan.   PA #/Case ID/Reference #: [id number]

## 2023-09-15 ENCOUNTER — Other Ambulatory Visit (HOSPITAL_COMMUNITY): Payer: Self-pay

## 2023-09-15 ENCOUNTER — Telehealth: Payer: Self-pay

## 2023-09-15 ENCOUNTER — Telehealth: Payer: Self-pay | Admitting: Family Medicine

## 2023-09-15 NOTE — Telephone Encounter (Signed)
PA request has been Submitted. New Encounter created for follow up. For additional info see Pharmacy Prior Auth telephone encounter from 09/15/2023.

## 2023-09-15 NOTE — Telephone Encounter (Signed)
Pharmacy Patient Advocate Encounter  Received notification from [facility] MEDICAID that Prior Authorization for Qulipta 60MG  tablets has been APPROVED from 09/15/2023 to 09/14/2024. Ran test claim, Copay is $4.00. This test claim was processed through [facility]- copay amounts may vary at other pharmacies due to pharmacy/plan contracts, or as the patient moves through the different stages of their insurance plan.   PA #/Case ID/Reference #: PA Case ID #: [id number]

## 2023-09-15 NOTE — Telephone Encounter (Signed)
Pharmacy Patient Advocate Encounter   Received notification from Physician's Office that prior authorization for Qulipta 60MG  tablets is required/requested.   Insurance verification completed.   The patient is insured through [facility] MEDICAID .   Per test claim: PA required; PA submitted to above mentioned insurance via CoverMyMeds Key/confirmation #/EOC [id number] Status is pending

## 2023-09-15 NOTE — Telephone Encounter (Signed)
There is no generic for Qulipta right now unfortunately. PA team, can you run a test claim or try a PA just to be sure she can't get it? Insurance covered it previously until 07/2023 but it doesn't look like we have attempted a PA since then. Thank you!

## 2023-09-15 NOTE — Telephone Encounter (Signed)
Pt states Atogepant (QULIPTA) 60 MG TABS is no longer covered under Medicaid and the cost is $1,500. She's requesting a generic brand that's covered if available. Requesting call back to discuss options

## 2024-03-08 ENCOUNTER — Other Ambulatory Visit: Payer: Self-pay | Admitting: Cardiology

## 2024-03-08 ENCOUNTER — Other Ambulatory Visit: Payer: Self-pay | Admitting: Gastroenterology

## 2024-03-08 DIAGNOSIS — K209 Esophagitis, unspecified without bleeding: Secondary | ICD-10-CM

## 2024-03-08 DIAGNOSIS — I10 Essential (primary) hypertension: Secondary | ICD-10-CM

## 2024-03-08 DIAGNOSIS — R002 Palpitations: Secondary | ICD-10-CM

## 2024-03-08 DIAGNOSIS — R112 Nausea with vomiting, unspecified: Secondary | ICD-10-CM

## 2024-03-09 ENCOUNTER — Ambulatory Visit: Admitting: Cardiology

## 2024-03-09 ENCOUNTER — Ambulatory Visit: Payer: Self-pay | Admitting: Cardiology

## 2024-04-19 ENCOUNTER — Ambulatory Visit: Admitting: Cardiology

## 2024-06-06 ENCOUNTER — Ambulatory Visit: Admitting: Cardiology

## 2024-06-06 ENCOUNTER — Telehealth: Payer: Self-pay | Admitting: Pharmacist

## 2024-06-06 NOTE — Telephone Encounter (Signed)
 Pharmacy Patient Advocate Encounter   Received notification from CoverMyMeds that prior authorization for Nurtec 75MG  dispersible tablets is required/requested.   Insurance verification completed.   The patient is insured through [facility] .   Per test claim: PA required; PA submitted to above mentioned insurance via Latent Key/confirmation #/EOC [id number] Status is pending

## 2024-06-06 NOTE — Telephone Encounter (Signed)
 Pharmacy Patient Advocate Encounter  Received notification from [facility] that Prior Authorization for NURTEC 75 MG PO TBDP has been APPROVED from 06/06/2024 to 06/06/2025   PA #/Case ID/Reference #: [id number]

## 2024-06-15 ENCOUNTER — Encounter: Payer: Self-pay | Admitting: Cardiology

## 2024-06-15 ENCOUNTER — Ambulatory Visit: Attending: Cardiology | Admitting: Cardiology

## 2024-06-15 VITALS — BP 106/86 | HR 110 | Resp 16 | Ht <= 58 in | Wt 107.4 lb

## 2024-06-15 DIAGNOSIS — I639 Cerebral infarction, unspecified: Secondary | ICD-10-CM | POA: Diagnosis present

## 2024-06-15 DIAGNOSIS — I10 Essential (primary) hypertension: Secondary | ICD-10-CM | POA: Diagnosis present

## 2024-06-15 DIAGNOSIS — Z87891 Personal history of nicotine dependence: Secondary | ICD-10-CM | POA: Diagnosis present

## 2024-06-15 DIAGNOSIS — I491 Atrial premature depolarization: Secondary | ICD-10-CM | POA: Insufficient documentation

## 2024-06-15 DIAGNOSIS — G43809 Other migraine, not intractable, without status migrainosus: Secondary | ICD-10-CM | POA: Insufficient documentation

## 2024-06-15 DIAGNOSIS — Q8719 Other congenital malformation syndromes predominantly associated with short stature: Secondary | ICD-10-CM | POA: Insufficient documentation

## 2024-06-15 NOTE — Progress Notes (Signed)
 Cardiology Office Note:  .   Date:  06/15/2024  ID:  Christine Beard, DOB [birth year], MRN [id number] PCP:  Barbra Odor, NP  Former Cardiology Providers: NA [facility] Providers Cardiologist:  Madonna Large, DO , [facility] (established care 10/18/2022) Electrophysiologist:  None  Click to update primary MD,subspecialty MD or APP then REFRESH:1}    Chief Complaint  Patient presents with   Noonan syndrome   Follow-up    History of Present Illness: .   Christine Beard is a 33 y.o. Caucasian female whose past medical history and cardiovascular risk factors includes: preeclampsia and postpartum hypertension, former smoker, PACs, hypertension, cryptogenic stroke, Noonan syndrome, migraine, anxiety/depression, OCD.   After her pregnancy patient had a cryptogenic stroke and was referred to cardiology for further evaluation and management.  She did undergo transesophageal echocardiogram as well as transcranial Doppler which were negative for PFO.  Her cardiac monitor did not illustrate atrial fibrillation during the monitoring period.  Patient carries a history of Noonan's syndrome prior to establishing care and therefore a cardiac MRI was ordered to evaluate for valvular heart disease.  Her cardiac MRI was in June 2024 which noted normal cardiac valves and no evidence of pulmonary valve stenosis.  Patient presents for 1 year follow-up visit.  Denies anginal chest pain or heart failure symptoms. Overall functional capacity remains stable. She denies palpitations.  Her pulse is elevated today secondary to taking up 5 flights of stairs as she is claustrophobic to the elevators.  No near-syncope or syncopal event.  Review of Systems: .   Review of Systems  Cardiovascular:  Negative for chest pain, claudication, dyspnea on exertion, irregular heartbeat, leg swelling, near-syncope, orthopnea, palpitations, paroxysmal nocturnal dyspnea and syncope.  Respiratory:  Negative for shortness  of breath.   Hematologic/Lymphatic: Negative for bleeding problem.  Musculoskeletal:  Negative for muscle cramps and myalgias.  Neurological:  Negative for dizziness and light-headedness.    Studies Reviewed:   EKG: EKG Interpretation Date/Time:  Friday June 15 2024 11:44:02 EDT Ventricular Rate:  105 PR Interval:  136 QRS Duration:  70 QT Interval:  338 QTC Calculation: 446 R Axis:   83  Text Interpretation: Sinus tachycardia When compared with ECG of 10-Dec-2022 16:22, Since last tracing rate slower Confirmed by Large Madonna [phone]) on 06/15/2024 11:46:50 AM  Echocardiogram: 11/20/2020 Normal LV systolic function with visual EF 55-60%. Left ventricle cavity is normal in size. Normal global wall motion. Normal diastolic filling pattern, normal LAP. Mild tricuspid regurgitation. No evidence of pulmonary hypertension. No prior study for comparison.   TEE 04/27/2022:  1. Left ventricular ejection fraction, by estimation, is 60 to 65%. The  left ventricle has normal function. The left ventricle has no regional  wall motion abnormalities.   2. Right ventricular systolic function is normal. The right ventricular  size is normal.   3. No left atrial/left atrial appendage thrombus was detected.   4. There was weekly positive right to left shunting with late appearence  ofthe contrast bubbles after 6-8 cycles. Good study done and repeated x  2.. Agitated saline contrast bubble study was positive with shunting  observed after >6 cardiac cycles  suggestive of intrapulmonary shunting.   5. The mitral valve is normal in structure. No evidence of mitral valve  regurgitation. No evidence of mitral stenosis.   6. The aortic valve is normal in structure. Aortic valve regurgitation is  not visualized. No aortic stenosis is present.   7. The inferior vena cava is normal  in size with greater than 50%  respiratory variability, suggesting right atrial pressure of 3 mmHg.   cMRI June  2024 1. Normal cardiac valves no evidence of pulmonary valve stenosis peak velocity 0.9 cm/sec   2.  Bovine arch with normal diameter and no coarctation   3. No evidence of PFO/ASD with Qp/Qs <1 by flow and 1.05 by stroke volumes   4.  Normal LV size and function LVEF 53%   5.  Low normal RVEF 50%   6.  No delayed hyper-enhancement with gadolinium   7.  Estimated cardiac output 4.4 L/min   8.  Normal parametric measures see above  Risk Assessment/Calculations:   NA   Labs:       Latest Ref Rng & Units 12/10/2022    4:25 PM 11/28/2022    1:38 PM 04/10/2021   10:22 AM  CBC  WBC 4.0 - 10.5 K/uL 13.1  7.2  5.3   Hemoglobin 12.0 - 15.0 g/dL 87.6  87.8  88.7   Hematocrit 36.0 - 46.0 % 38.8  36.6  36.3   Platelets 150 - 400 K/uL 244  240  175        Latest Ref Rng & Units 12/10/2022    4:25 PM 11/28/2022    1:38 PM 04/10/2021   10:22 AM  BMP  Glucose 70 - 99 mg/dL 899  82  89   BUN 6 - 20 mg/dL 6  6  11    Creatinine 0.44 - 1.00 mg/dL 9.20  9.27  9.07   BUN/Creat Ratio 9 - 23  8    Sodium 135 - 145 mmol/L 136  140  142   Potassium 3.5 - 5.1 mmol/L 3.5  4.6  3.7   Chloride 98 - 111 mmol/L 105  103  113   CO2 22 - 32 mmol/L 22  24  22    Calcium  8.9 - 10.3 mg/dL 9.0  9.3  9.3       Latest Ref Rng & Units 12/10/2022    4:25 PM 11/28/2022    1:38 PM 04/10/2021   10:22 AM  CMP  Glucose 70 - 99 mg/dL 899  82  89   BUN 6 - 20 mg/dL 6  6  11    Creatinine 0.44 - 1.00 mg/dL 9.20  9.27  9.07   Sodium 135 - 145 mmol/L 136  140  142   Potassium 3.5 - 5.1 mmol/L 3.5  4.6  3.7   Chloride 98 - 111 mmol/L 105  103  113   CO2 22 - 32 mmol/L 22  24  22    Calcium  8.9 - 10.3 mg/dL 9.0  9.3  9.3   Total Protein 6.5 - 8.1 g/dL 6.6  6.4  6.8   Total Bilirubin 0.3 - 1.2 mg/dL 0.9  0.2  0.5   Alkaline Phos 38 - 126 U/L 81  107  67   AST 15 - 41 U/L 12  8  20    ALT 0 - 44 U/L 12  8  22      Lab Results  Component Value Date   CHOL 193 12/10/2020   HDL 79 12/10/2020   LDLCALC 106 (H)  12/10/2020   TRIG 38 12/10/2020   CHOLHDL 2.4 12/10/2020   No results for input(s): LIPOA in the last 8760 hours. No components found for: NTPROBNP No results for input(s): PROBNP in the last 8760 hours. No results for input(s): TSH in the last 8760 hours.  Physical Exam:  Today's Vitals   06/15/24 1141  BP: 106/86  Pulse: (!) 110  Resp: 16  SpO2: 97%  Weight: 107 lb 6.4 oz (48.7 kg)  Height: 4' 7 (1.397 m)   Body mass index is 24.96 kg/m. Wt Readings from Last 3 Encounters:  06/15/24 107 lb 6.4 oz (48.7 kg)  06/15/23 100 lb 8 oz (45.6 kg)  03/10/23 88 lb 9.6 oz (40.2 kg)    Physical Exam  Constitutional: No distress.  Age appropriate, hemodynamically stable.   Neck: No JVD present.  Cardiovascular: Regular rhythm, S1 normal, S2 normal, intact distal pulses and normal pulses. Tachycardia present. Exam reveals no gallop, no S3 and no S4.  No murmur heard. Pulmonary/Chest: Effort normal and breath sounds normal. No stridor. She has no wheezes. She has no rales.  Abdominal: Soft. Bowel sounds are normal. She exhibits no distension. There is no abdominal tenderness.  Musculoskeletal:        General: No edema.     Cervical back: Neck supple.  Neurological: She is alert and oriented to person, place, and time. She has intact cranial nerves (2-12).  Skin: Skin is warm and moist.     Impression & Recommendation(s):  Impression:   ICD-10-CM   1. Noonan syndrome  Q87.19 EKG 12-Lead    2. Cryptogenic stroke (HCC)  I63.9     3. PAC (premature atrial contraction)  I49.1     4. Benign hypertension  I10     5. Other migraine without status migrainosus, not intractable  G43.809     6. 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 Patient's cardiac MRI did not illustrate evidence of pulmonic stenosis or other congenital  anomalies. Continue to monitor.  Cryptogenic stroke (HCC) No recurrence of stroke since last office visit. Continue aspirin  81 mg p.o. daily. Continue Lipitor 20 mg p.o. daily. Cardiac monitor: Did not illustrate evidence of atrial fibrillation during the monitoring period. TEE negative for cardioembolic source. Reemphasized the importance of secondary prevention with focus on improving the modifiable cardiovascular risk factors such as glycemic control, lipid management, blood pressure control, weight loss.  Benign hypertension Office blood pressures are soft. Home blood pressures are well-controlled. Currently on amlodipine  2.5 mg p.o. daily.  Patient is relatively stable from a cardiovascular standpoint.  Recommend follow-up in approximately 16 months sooner if needed.  Patient agreeable with the plan of care.  Orders Placed:  Orders Placed This Encounter  Procedures   EKG 12-Lead     Final Medication List:   No orders of the defined types were placed in this encounter.   There are no discontinued medications.   Current Outpatient Medications:    amLODipine  (NORVASC ) 5 MG tablet, Take 0.5 tablets (2.5 mg total) by mouth daily. Patient must keep upcoming appointment for further refills, Disp: 41 tablet, Rfl: 0   ASPIRIN  LOW DOSE 81 MG tablet, TAKE 1 TABLET (81 MG TOTAL) BY MOUTH DAILY. SWALLOW WHOLE., Disp: 90 tablet, Rfl: 4   Atogepant  (QULIPTA ) 60 MG TABS, Take 1 tablet (60 mg total) by mouth daily., Disp: 90 tablet, Rfl: 3   atorvastatin  (LIPITOR) 20 MG tablet, Take 1 tablet (20 mg total) by mouth daily., Disp: 90 tablet, Rfl: 3   Biotin 5 MG CAPS, Take 5 mg by mouth daily., Disp: , Rfl:    buPROPion (WELLBUTRIN XL) 300 MG 24 hr tablet,  Take 300 mg by mouth daily., Disp: , Rfl:    busPIRone (BUSPAR) 15 MG tablet, Take 15 mg by mouth daily., Disp: , Rfl:    calcium  carbonate (OS-CAL) 600 MG TABS tablet, Take 1,200 mg of elemental calcium  by mouth daily., Disp: , Rfl:     cholecalciferol (VITAMIN D3) 25 MCG (1000 UNIT) tablet, Take 1,000 Units by mouth daily., Disp: , Rfl:    ELDERBERRY PO, Take 1 tablet by mouth daily., Disp: , Rfl:    EPINEPHrine  0.3 mg/0.3 mL IJ SOAJ injection, Inject 0.3 mg into the muscle as needed., Disp: , Rfl:    fluticasone (VERAMYST) 27.5 MCG/SPRAY nasal spray, Place 2 sprays into the nose daily., Disp: , Rfl:    Fluvoxamine Maleate 150 MG CP24, Take 300 mg by mouth at bedtime., Disp: , Rfl:    ibuprofen  (ADVIL ) 200 MG tablet, Take 800 mg by mouth every 8 (eight) hours as needed for cramping or moderate pain., Disp: , Rfl:    levothyroxine  (SYNTHROID ) 25 MCG tablet, Take 25 mcg by mouth daily before breakfast., Disp: , Rfl:    loratadine (CLARITIN) 10 MG tablet, Take 10 mg by mouth daily., Disp: , Rfl:    Multiple Vitamins-Minerals (ONE-A-DAY FOR HER VITACRAVES PO), Take 1 tablet by mouth daily., Disp: , Rfl:    Omega-3 Fatty Acids (FISH OIL) 1000 MG CAPS, Take 1,000 mg by mouth daily., Disp: , Rfl:    omeprazole  (PRILOSEC) 20 MG capsule, TAKE 1 CAPSULE BY MOUTH EVERY DAY, Disp: 90 capsule, Rfl: 1   ondansetron  (ZOFRAN ) 8 MG tablet, Take 8 mg by mouth every 8 (eight) hours as needed for nausea/vomiting., Disp: , Rfl:    Rimegepant Sulfate (NURTEC) 75 MG TBDP, Take 1 tablet (75 mg total) by mouth daily as needed (take for abortive therapy of migraine, no more than 1 tablet in 24 hours or 10 per month)., Disp: 8 tablet, Rfl: 11   vitamin B-12 (CYANOCOBALAMIN ) 1000 MCG tablet, Take 1,000 mcg by mouth daily., Disp: , Rfl:   Consent:   NA  Disposition:   16 months.   Her questions and concerns were addressed to her satisfaction. She voices understanding of the recommendations provided during this encounter.    Signed, Madonna Michele HAS, [facility]  A Division of [facility] [address], [place], [place] [address]  06/15/2024 1:01 PM

## 2024-06-15 NOTE — Patient Instructions (Signed)
 Medication Instructions:  Your physician recommends that you continue on your current medications as directed. Please refer to the Current Medication list given to you today.  *If you need a refill on your cardiac medications before your next appointment, please call your pharmacy*  Lab Work: If you have labs (blood work) drawn today and your tests are completely normal, you will receive your results only by: MyChart Message (if you have MyChart) OR A paper copy in the mail If you have any lab test that is abnormal or we need to change your treatment, we will call you to review the results.  Testing/Procedures: None ordered today.  Follow-Up: At [facility], you and your health needs are our priority.  As part of our continuing mission to provide you with exceptional heart care, our providers are all part of one team.  This team includes your primary Cardiologist (physician) and Advanced Practice Providers or APPs (Physician Assistants and Nurse Practitioners) who all work together to provide you with the care you need, when you need it.  Your next appointment:   16 months  Provider:   Dr. Michele  We recommend signing up for the patient portal called MyChart.  Sign up information is provided on this After Visit Summary.  MyChart is used to connect with patients for Virtual Visits (Telemedicine).  Patients are able to view lab/test results, encounter notes, upcoming appointments, etc.  Non-urgent messages can be sent to your provider as well.   To learn more about what you can do with MyChart, go to [email or web address].   Other Instructions

## 2024-06-18 NOTE — Patient Instructions (Incomplete)
 Below is our plan:  Continue Qulipta  daily and Nurtec as needed. I am going to check an EEG to make sure you are not having seizures. Make sure you call your PCP. You may be able to continue mood management with her or may need a referral to a new psychiatrist. Please seek emergency medical attention for any acute changes in mood.   Goals:  1) Maintain strict control of hypertension with blood pressure goal below 130/90 2) Maintain good control of diabetes with hemoglobin A1c goal below 7%  3) Maintain good control of lipids with LDL cholesterol goal below 70 mg/dL.  4) Eat a healthy diet with plenty of whole grains, cereals, fruits and vegetables, exercise regularly and maintain ideal body weight   Resources: https://www.williams.biz/  Please make sure you are staying well hydrated. I recommend 50-60 ounces daily. Well balanced diet and regular exercise encouraged. Consistent sleep schedule with 6-8 hours recommended.   Please continue follow up with care team as directed.   Follow up with me in 6 months   You may receive a survey regarding today's visit. I encourage you to leave honest feed back as I do use this information to improve patient care. Thank you for seeing me today!     GENERAL HEADACHE INFORMATION:   Natural supplements: Magnesium  Oxide or Magnesium  Glycinate 500 mg at bed (up to 800 mg daily) Coenzyme Q10 300 mg in AM Vitamin B2- 200 mg twice a day   Add 1 supplement at a time since even natural supplements can have undesirable side effects. You can sometimes buy supplements cheaper (especially Coenzyme Q10) at www.WebmailGuide.co.za or at [facility].  Migraine with aura: There is increased risk for stroke in women with migraine with aura and a contraindication for the combined contraceptive pill for use by women who have migraine with aura. The risk for women with migraine without aura is lower. However other risk  factors like smoking are far more likely to increase stroke risk than migraine. There is a recommendation for no smoking and for the use of OCPs without estrogen such as progestogen only pills particularly for women with migraine with aura.SABRA People who have migraine headaches with auras may be 3 times more likely to have a stroke caused by a blood clot, compared to migraine patients who don't see auras. Women who take hormone-replacement therapy may be 30 percent more likely to suffer a clot-based stroke than women not taking medication containing estrogen. Other risk factors like smoking and high blood pressure may be  much more important.    Vitamins and herbs that show potential:   Magnesium : Magnesium  (250 mg twice a day or 500 mg at bed) has a relaxant effect on smooth muscles such as blood vessels. Individuals suffering from frequent or daily headache usually have low magnesium  levels which can be increase with daily supplementation of 400-750 mg. Three trials found 40-90% average headache reduction  when used as a preventative. Magnesium  may help with headaches are aura, the best evidence for magnesium  is for migraine with aura is its thought to stop the cortical spreading depression we believe is the pathophysiology of migraine aura.Magnesium  also demonstrated the benefit in menstrually related migraine.  Magnesium  is part of the messenger system in the serotonin cascade and it is a good muscle relaxant.  It is also useful for constipation which can be a side effect of other medications used to treat migraine. Good sources include nuts, whole grains, and tomatoes. Side Effects: loose stool/diarrhea  Riboflavin (vitamin B 2) 200 mg twice a day. This vitamin assists nerve cells in the production of ATP a principal energy storing molecule.  It is necessary for many chemical reactions in the body.  There have been at least 3 clinical trials of riboflavin using 400 mg per day all of which suggested that  migraine frequency can be decreased.  All 3 trials showed significant improvement in over half of migraine sufferers.  The supplement is found in bread, cereal, milk, meat, and poultry.  Most Americans get more riboflavin than the recommended daily allowance, however riboflavin deficiency is not necessary for the supplements to help prevent headache. Side effects: energizing, green urine   Coenzyme Q10: This is present in almost all cells in the body and is critical component for the conversion of energy.  Recent studies have shown that a nutritional supplement of CoQ10 can reduce the frequency of migraine attacks by improving the energy production of cells as with riboflavin.  Doses of 150 mg twice a day have been shown to be effective.   Melatonin: Increasing evidence shows correlation between melatonin secretion and headache conditions.  Melatonin supplementation has decreased headache intensity and duration.  It is widely used as a sleep aid.  Sleep is natures way of dealing with migraine.  A dose of 3 mg is recommended to start for headaches including cluster headache. Higher doses up to 15 mg has been reviewed for use in Cluster headache and have been used. The rationale behind using melatonin for cluster is that many theories regarding the cause of Cluster headache center around the disruption of the normal circadian rhythm in the brain.  This helps restore the normal circadian rhythm.   HEADACHE DIET: Foods and beverages which may trigger migraine Note that only 20% of headache patients are food sensitive. You will know if you are food sensitive if you get a headache consistently 20 minutes to 2 hours after eating a certain food. Only cut out a food if it causes headaches, otherwise you might remove foods you enjoy! What matters most for diet is to eat a well balanced healthy diet full of vegetables and low fat protein, and to not miss meals.   Chocolate, other sweets ALL cheeses except cottage  and cream cheese Dairy products, yogurt, sour cream, ice cream Liver Meat extracts (Bovril, Marmite, meat tenderizers) Meats or fish which have undergone aging, fermenting, pickling or smoking. These include: Hotdogs,salami,Lox,sausage, mortadellas,smoked salmon, pepperoni, Pickled herring Pods of broad bean (English beans, Chinese pea pods, [place] (fava) beans, lima and navy beans Ripe avocado, ripe banana Yeast extracts or active yeast preparations such as Brewer's or Fleishman's (commercial bakes goods are permitted) Tomato based foods, pizza (lasagna, etc.)   MSG (monosodium glutamate) is disguised as many things; look for these common aliases: Monopotassium glutamate Autolysed yeast Hydrolysed protein Sodium caseinate "flavorings" "all natural preservatives Nutrasweet   Avoid all other foods that convincingly provoke headaches.   Resources: The Dizzy Bluford Aid Your Headache Diet, migrainestrong.com  https://zamora-andrews.com/   Caffeine  and Migraine For patients that have migraine, caffeine  intake more than 3 days per week can lead to dependency and increased migraine frequency. I would recommend cutting back on your caffeine  intake as best you can. The recommended amount of caffeine  is 200-300 mg daily, although migraine patients may experience dependency at even lower doses. While you may notice an increase in headache temporarily, cutting back will be helpful for headaches in the long run. For more information on caffeine  and  migraine, visit: https://americanmigrainefoundation.org/resource-library/caffeine -and-migraine/   Headache Prevention Strategies:   1. Maintain a headache diary; learn to identify and avoid triggers.  - This can be a simple note where you log when you had a headache, associated symptoms, and medications used - There are several smartphone apps developed to help track migraines: Migraine Buddy, Migraine  Monitor, Curelator N1-Headache App   Common triggers include: Emotional triggers: Emotional/Upset family or friends Emotional/Upset occupation Business reversal/success Anticipation anxiety Crisis-serious Post-crisis periodNew job/position   Physical triggers: Vacation Day Weekend Strenuous Exercise High Altitude Location New Move Menstrual Day Physical Illness Oversleep/Not enough sleep Weather changes Light: Photophobia or light sesnitivity treatment involves a balance between desensitization and reduction in overly strong input. Use dark polarized glasses outside, but not inside. Avoid bright or fluorescent light, but do not dim environment to the point that going into a normally lit room hurts. Consider FL-41 tint lenses, which reduce the most irritating wavelengths without blocking too much light.  These can be obtained at axonoptics.com or theraspecs.com Foods: see list above.   2. Limit use of acute treatments (over-the-counter medications, triptans, etc.) to no more than 2 days per week or 10 days per month to prevent medication overuse headache (rebound headache).     3. Follow a regular schedule (including weekends and holidays): Don't skip meals. Eat a balanced diet. 8 hours of sleep nightly. Minimize stress. Exercise 30 minutes per day. Being overweight is associated with a 5 times increased risk of chronic migraine. Keep well hydrated and drink 6-8 glasses of water per day.   4. Initiate non-pharmacologic measures at the earliest onset of your headache. Rest and quiet environment. Relax and reduce stress. Breathe2Relax is a free app that can instruct you on    some simple relaxtion and breathing techniques. Http://Dawnbuse.com is a    free website that provides teaching videos on relaxation.  Also, there are  many apps that   can be downloaded for "mindful" relaxation.  An app called YOGA NIDRA will help walk you through mindfulness. Another app called Calm can be  downloaded to give you a structured mindfulness guide with daily reminders and skill development. Headspace for guided meditation Mindfulness Based Stress Reduction Online Course: www.palousemindfulness.com Cold compresses.   5. Don't wait!! Take the maximum allowable dosage of prescribed medication at the first sign of migraine.   6. Compliance:  Take prescribed medication regularly as directed and at the first sign of a migraine.   7. Communicate:  Call your physician when problems arise, especially if your headaches change, increase in frequency/severity, or become associated with neurological symptoms (weakness, numbness, slurred speech, etc.). Proceed to emergency room if you experience new or worsening symptoms or symptoms do not resolve, if you have new neurologic symptoms or if headache is severe, or for any concerning symptom.   8. Headache/pain management therapies: Consider various complementary methods, including medication, behavioral therapy, psychological counselling, biofeedback, massage therapy, acupuncture, dry needling, and other modalities.  Such measures may reduce the need for medications. Counseling for pain management, where patients learn to function and ignore/minimize their pain, seems to work very well.   9. Recommend changing family's attention and focus away from patient's headaches. Instead, emphasize daily activities. If first question of day is 'How are your headaches/Do you have a headache today?', then patient will constantly think about headaches, thus making them worse. Goal is to re-direct attention away from headaches, toward daily activities and other distractions.   10. Helpful Websites: www.AmericanHeadacheSociety.org PatentHood.ch www.headaches.org  TightMarket.nl www.achenet.org

## 2024-06-18 NOTE — Progress Notes (Unsigned)
 No chief complaint on file.   HISTORY OF PRESENT ILLNESS:  06/18/24 ALL:  Christine Beard returns for follow up for migraines.   She continues Qulipta  and Nurtec.   She continues atorvastatin  and asa. She is followed regularly by PCP, cardiology, and psychiatry.   06/15/2023 ALL:  Christine Beard returns for follow up for migraines. She was last seen 11/2022 and migraines were well managed on Qulipta  and Nurtec. She was having more tension style headaches thought to be related to elevated BP. She was seeing PCP and amlodipine  dose had recently been increased. Since, she reports doing well. She continues to have intermittent dizziness. BP has been well controlled. She only takes amlodipine  as needed. Noral BP around 115-120/70s. Headaches are usually well managed on Qulipta . She has occasional blurred vision. Last eye exam normal 2 years ago. She has had trouble getting med consistently from her pharmacy. She rarely needs Nurtec. She has been using ibuprofen  for back pain. She continues atorvastatin  and asa. She is followed regularly by PCP, cardiology, and psychiatry.   11/11/2022 ALL: Christine Beard returns for follow up for migraines and history of CVA. We saw her last 04/2022 and switched Emgality  to Qulipta  for better coverage. Nurtec and metoclopramide  used for abortive therapy. She reports headaches were better for about 5-6 months. Averaging 3-4 headache days a month, down from 8-10. She has not taken Nurtec recently.   She reports having daily headaches over the past 2 weeks. She reached out to Dr Michele, cardiology, 11/09/2022 reporting elevated BP readings. Amlodipine  was increased to 5mg  daily. She increased dose yesterday. She reports feeling dizzy and like she was going to pass out two different occasions, yesterday. She did not check BP. She has felt better today but has a headache. She is drinking about 24-30 ounces of water daily. She has a history of vertigo.   She continues close follow up with PCP. She is  taking asa 81mg  and atorvastatin  20mg  daily. No stroke like symptoms.   Medications tried that can be used in migraine management: Qulipta , Emgality , amlodipine , citalopram, fluoxetine , labetalol , propranolol , sertraline , ibuprofen , [name] , stadol , benadryl , Toradol , magnesium , meclizine , metoclopramide , naproxen , zofran , phenergan , tramadol   04/29/2022 ALL: Christine Beard is a 33 y.o. female here today for follow up for  migraines and previous CVA. She was seen by Dr Ines 12/2021 and started on Emgality . She reports that headaches are significantly better. She does feel that Emgality  wears off after three weeks. She also reports pain with injections. She reports having 8-12 headache days with 5-7 migraines per month. She usually takes ondansetron  or metoclopramide  for abortive therapy.   She continues atorvastatin  20mg  and asa 81mg  daily. She is followed closely by PCP. She was diagnosed with Noonan Syndrome. She had TEE 04/27/2022 that showed EF 60-65% and was positive with shunting observed after >6 cardiac cycles suggestive of intrapulmonary shunting. There is no evidence of a patent foramen ovale. There is no evidence of an atrial septal defect. She is followed closely by PCP and cardiology. BP . She quit smoking 4-5 years ago.   HISTORY (copied from Dr Sharion previous note)  12/16/2021: Here for follow up of migraines. The Topiramate  is not working. She had tubal ligation no chance of having children. Having daily migraines.  Discussed  the multiple options that she has, at this time she has failed multiple medications. She has daily headaches and > 15 migraine days a month, unilateral, pulsating pounding, nausea, lasting up to 24 hours, photophobia and phonophobia.  We discussed other oral traditional medications, we discussed the new class of CGRP such as Aimovig, Ajovy, Emgality , we also discussed atogepant  and we decided on Emgality .  We discussed migraine management, lifestyle factors,  preventative and acute management.  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   MRI brain 12/05/2020: personally reviewed and agree with findings IMPRESSION: This MRI of the brain with and without contrast shows the following: 1.   4 mm T2 hyperintense focus in the left thalamus consistent with a small focus of gliosis or lacunar infarction.  Though more subtle on CT scan, it appears to be present on the 10/31/2018 images. 2.   Several punctate T2/flair hyperintense foci in the subcortical white matter.  This is nonspecific and could be the sequela of migraine or chronic ischemic change. 3.   Normal enhancement pattern.  No acute findings.   CTA head(reviewed reports) unremarkable   (Additional 20 minutes reviewing images)   Patient complains of symptoms per HPI as well as the following symptoms: migraine,strokes . Pertinent negatives and positives per HPI. All others negative   HPI:  Christine Beard is a 33 y.o. female here as requested by Barbra Odor, NP for daily headaches for one year --- actually most of her life. PMHx thyroid  disease, SVD, migraine, hypothyroidism, headache, depression, anxiety. She has had migraines for years. Worsened after her second child when she had a scare with her children and her child almost fell down the stairs and she had acute right-sided weakness and a headache in 2019. One day she had ringing in her right ear and ever since then she has daily headaches and migraines, very bad migraines, dizziness, she takes daily OTC medications. Migraines are usually on the right  side, ppounding/pulsating/throbbing, photophobia/phonophobia, nausea all the time, she has vomited, the migraine and headache last all day long 24 hours and are daily and moderate to severe pain. Lots of stress at home too. She gets so dizzy she feels she is going to faint. She has vision changes, headaches are worse positionally esp in the morning.  No more children, had tubal ligation. No other focal neurologic deficits, associated symptoms, inciting events or modifiable factors.   Reviewed notes, labs and imaging from outside physicians, which showed:   I tried to review primary care notes, they are handwritten and difficult to accurately interpret but it appears as though head, eyes, ears, nose, throat were normal, pupils equally round reactive to light, cardiovascularly normal, respiratory lungs clear to auscultation, neuro remote and recent memory intact alert and oriented x4, paresthesias, she has headaches and migraines, stressors versus psych follow-up as indicated, referral to neurology as well as [facility], she was prescribed meclizine  for vertigo and dizziness   Echo cardiogram normal : reviewed report   From a thorough review of records, Medications tried that can be used in migraine management: tylenol , amlodipine , ibuprofen ,fioricet , stadol , celexa, benadryl , prozac , ibuprofen , toradol , labetalol , magnesium , meclizine , reglan , naproxen , zofran , phenergan , propranolol , zoloft , tramadol ,    CT head 10/31/2018: Ct showed No acute intracranial abnormalities including mass lesion or mass effect, hydrocephalus, extra-axial fluid collection, midline shift, hemorrhage, or acute infarction, large ischemic events (personally  reviewed images)   06/11/2015: MR MRV   FINDINGS: Superior sagittal sinus, internal cerebral veins, vein of Galen, straight sinus, right transverse sinus, right sigmoid sinus, and jugular bulbs are patent without evidence of thrombus. Apparent lack of flow in the  proximal right transverse sinus on MIP images is artifactual, without evidence of thrombus on the source images.   The left transverse and left sigmoid sinuses are hypoplastic with areas of signal dropout felt to be artifactual without discrete filling defects seen on source images to indicate thrombus.   IMPRESSION: No evidence of dural venous sinus thrombosis. 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   REVIEW OF SYSTEMS: Out of a complete 14 system review of symptoms, the patient complains only of the following symptoms, headaches, dizziness, blurred vision and all other reviewed systems are negative.   ALLERGIES: Allergies  Allergen Reactions   Cinnamon Itching and Other (See Comments)   Codeine Nausea And Vomiting and Other (See Comments)     HOME MEDICATIONS: Outpatient Medications Prior to Visit  Medication Sig Dispense Refill   amLODipine  (NORVASC ) 5 MG tablet Take 0.5 tablets (2.5 mg total) by mouth daily. Patient must keep upcoming appointment for further refills 41 tablet 0   ASPIRIN  LOW DOSE 81 MG tablet TAKE 1 TABLET (81 MG TOTAL) BY MOUTH DAILY. SWALLOW WHOLE. 90 tablet 4   Atogepant  (QULIPTA ) 60 MG TABS Take 1 tablet (60 mg total) by mouth daily. 90 tablet 3   atorvastatin  (LIPITOR) 20 MG tablet Take 1 tablet (20 mg total) by mouth daily. 90 tablet 3   Biotin 5 MG CAPS Take 5 mg by mouth daily.     buPROPion (WELLBUTRIN XL) 300 MG 24 hr tablet Take 300 mg by mouth daily.     busPIRone (BUSPAR) 15 MG tablet Take 15 mg by mouth daily.     calcium  carbonate (OS-CAL) 600 MG TABS tablet Take 1,200 mg of elemental calcium  by mouth daily.     cholecalciferol (VITAMIN D3) 25 MCG (1000 UNIT) tablet Take 1,000 Units by mouth daily.     ELDERBERRY PO Take 1 tablet by mouth daily.     EPINEPHrine  0.3 mg/0.3 mL IJ SOAJ injection Inject 0.3 mg into the muscle as needed.     fluticasone (VERAMYST) 27.5 MCG/SPRAY nasal spray Place 2 sprays into the nose daily.      Fluvoxamine Maleate 150 MG CP24 Take 300 mg by mouth at bedtime.     ibuprofen  (ADVIL ) 200 MG tablet Take 800 mg by mouth every 8 (eight) hours as needed for cramping or moderate pain.     levothyroxine  (SYNTHROID ) 25 MCG tablet Take 25 mcg by mouth daily before breakfast.     loratadine (CLARITIN) 10 MG tablet Take 10 mg by mouth daily.     Multiple Vitamins-Minerals (ONE-A-DAY FOR HER VITACRAVES PO) Take 1 tablet by mouth daily.     Omega-3 Fatty Acids (FISH OIL) 1000 MG CAPS Take 1,000 mg by mouth daily.     omeprazole  (PRILOSEC) 20 MG capsule TAKE 1 CAPSULE BY MOUTH EVERY DAY 90 capsule 1   ondansetron  (ZOFRAN ) 8 MG tablet Take 8 mg by mouth every 8 (eight) hours as needed for nausea/vomiting.     Rimegepant Sulfate (NURTEC) 75 MG TBDP Take 1 tablet (75 mg total) by mouth daily as needed (take for abortive therapy of migraine, no more than 1 tablet in 24 hours or 10 per month). 8 tablet 11   vitamin B-12 (CYANOCOBALAMIN ) 1000 MCG tablet Take 1,000  mcg by mouth daily.     No facility-administered medications prior to visit.     PAST MEDICAL HISTORY: Past Medical History:  Diagnosis Date   Anemia    Anxiety    Depression    GERD (gastroesophageal reflux disease)    Headache    Hyperlipidemia    Hypertension    Hypothyroidism    Migraine    Noonan syndrome    S/P tubal ligation 10/06/2017   Stroke (HCC)    SVD (spontaneous vaginal delivery) 10/05/2017   Thyroid  disease      PAST SURGICAL HISTORY: Past Surgical History:  Procedure Laterality Date   BUBBLE STUDY  04/27/2022   Procedure: BUBBLE STUDY;  Surgeon: Ladona Heinz, MD;  Location: [facility] ENDOSCOPY;  Service: Cardiovascular;;   CHOLECYSTECTOMY N/A 04/16/2021   Procedure: LAPAROSCOPIC CHOLECYSTECTOMY;  Surgeon: Stevie Herlene Righter, MD;  Location: [facility] ORS;  Service: General;  Laterality: N/A;   TEE WITHOUT CARDIOVERSION N/A 04/27/2022   Procedure: TRANSESOPHAGEAL ECHOCARDIOGRAM (TEE);  Surgeon: Ladona Heinz, MD;  Location: [facility]  ENDOSCOPY;  Service: Cardiovascular;  Laterality: N/A;   TOE SURGERY  04/2005   TONSILLECTOMY  11/2004   TUBAL LIGATION N/A 10/06/2017   Procedure: POST PARTUM TUBAL LIGATION;  Surgeon: Danielle Rom, MD;  Location: [facility];  Service: Gynecology;  Laterality: N/A;     FAMILY HISTORY: Family History  Problem Relation Age of Onset   High blood pressure Maternal Grandmother    Cancer Maternal Grandmother        they removed it so she's fine   Diabetes Paternal Grandmother    Cancer Paternal Grandmother    Heart attack Father    Migraines Father    Heart attack Paternal Grandfather      SOCIAL HISTORY: Social History   Socioeconomic History   Marital status: Significant Other    Spouse name: Not on file   Number of children: 2   Years of education: Not on file   Highest education level: GED or equivalent  Occupational History   Occupation: homemaker  Tobacco Use   Smoking status: Former    Current packs/day: 0.00    Types: Cigarettes    Quit date: 10/03/2020    Years since quitting: 3.7   Smokeless tobacco: Never  Vaping Use   Vaping status: Never Used  Substance and Sexual Activity   Alcohol use: Not Currently   Drug use: Never   Sexual activity: Yes    Birth control/protection: Surgical    Comment: tubal ligation  Other Topics Concern   Not on file  Social History Narrative   Lives at home with fiance and 2 children   Right handed   Caffeine : a lot of pepsi but also tries to drink a lot of water as well   Social Drivers of Corporate investment banker Strain: Not on file  Food Insecurity: Not on file  Transportation Needs: Not on file  Physical Activity: Not on file  Stress: Not on file  Social Connections: Unknown (02/01/2022)   Received from [facility]   Social Network    Social Network: Not on file  Intimate Partner Violence: Unknown (01/07/2022)   Received from [facility]   HITS    Physically Hurt: Not on file    Insult or  Talk Down To: Not on file    Threaten Physical Harm: Not on file    Scream or Curse: Not on file     PHYSICAL EXAM  There were no vitals filed  for this visit.    There is no height or weight on file to calculate BMI.  Generalized: Well developed, in no acute distress  Cardiology: normal rate and rhythm, no murmur auscultated  Respiratory: clear to auscultation bilaterally    Neurological examination  Mentation: Alert oriented to time, place, history taking. Follows all commands speech and language fluent Cranial nerve II-XII: Pupils were equal round reactive to light. Extraocular movements were full, visual field were full on confrontational test. Facial sensation and strength were normal. Head turning and shoulder shrug  were normal and symmetric. Motor: The motor testing reveals 5 over 5 strength of all 4 extremities. Good symmetric motor tone is noted throughout.  Gait and station: Gait is normal. Tandem gait is normal.    DIAGNOSTIC DATA (LABS, IMAGING, TESTING) - I reviewed patient records, labs, notes, testing and imaging myself where available.  Lab Results  Component Value Date   WBC 13.1 (H) 12/10/2022   HGB 12.3 12/10/2022   HCT 38.8 12/10/2022   MCV 92.2 12/10/2022   PLT 244 12/10/2022      Component Value Date/Time   NA 136 12/10/2022 1625   NA 140 11/28/2022 1338   K 3.5 12/10/2022 1625   CL 105 12/10/2022 1625   CO2 22 12/10/2022 1625   GLUCOSE 100 (H) 12/10/2022 1625   BUN 6 12/10/2022 1625   BUN 6 11/28/2022 1338   CREATININE 0.79 12/10/2022 1625   CALCIUM  9.0 12/10/2022 1625   PROT 6.6 12/10/2022 1625   PROT 6.4 11/28/2022 1338   ALBUMIN 3.5 12/10/2022 1625   ALBUMIN 3.8 (L) 11/28/2022 1338   AST 12 (L) 12/10/2022 1625   ALT 12 12/10/2022 1625   ALKPHOS 81 12/10/2022 1625   BILITOT 0.9 12/10/2022 1625   BILITOT 0.2 11/28/2022 1338   GFRNONAA >60 12/10/2022 1625   GFRAA >60 10/28/2018 0646   Lab Results  Component Value Date   CHOL 193  12/10/2020   HDL 79 12/10/2020   LDLCALC 106 (H) 12/10/2020   TRIG 38 12/10/2020   CHOLHDL 2.4 12/10/2020   Lab Results  Component Value Date   HGBA1C 4.4 (L) 12/10/2020   Lab Results  Component Value Date   VITAMINB12 >1500 (H) 01/24/2023   Lab Results  Component Value Date   TSH 0.916 11/28/2022        No data to display               No data to display           ASSESSMENT AND PLAN  33 y.o. year old female  has a past medical history of Anemia, Anxiety, Depression, GERD (gastroesophageal reflux disease), Headache, Hyperlipidemia, Hypertension, Hypothyroidism, Migraine, Noonan syndrome, S/P tubal ligation (10/06/2017), Stroke (HCC), SVD (spontaneous vaginal delivery) (10/05/2017), and Thyroid  disease. here with    No diagnosis found.  Christine Beard reports headaches are significantly better on Qulipta  60mg  daily. Nurtec continued for abortive therapy. She can not take triptans due to history of CVA. She will continue to monitor BP closely. Healthy lifestyle habits encouraged. She will follow up with PCP, psych and cardiology as directed. Continue asa and rosuvastatin. Advised to update eye exam. She will return to see me in 1 year, sooner if needed. She verbalizes understanding and agreement with this plan.    No orders of the defined types were placed in this encounter.    No orders of the defined types were placed in this encounter.    Zhanna Melin, MSN,  FNP-C 06/18/2024, 11:13 AM  [facility] [address] [place], [place] [address] [phone]

## 2024-06-19 ENCOUNTER — Encounter: Payer: Self-pay | Admitting: Family Medicine

## 2024-06-19 ENCOUNTER — Ambulatory Visit (INDEPENDENT_AMBULATORY_CARE_PROVIDER_SITE_OTHER): Payer: Medicaid Other | Admitting: Family Medicine

## 2024-06-19 VITALS — BP 120/78 | HR 96 | Ht <= 58 in | Wt 110.6 lb

## 2024-06-19 DIAGNOSIS — R410 Disorientation, unspecified: Secondary | ICD-10-CM | POA: Diagnosis not present

## 2024-06-19 MED ORDER — QULIPTA 60 MG PO TABS: 1.0000 | ORAL_TABLET | Freq: Every day | ORAL | 3 refills | Status: AC

## 2024-06-19 MED ORDER — NURTEC 75 MG PO TBDP: 75.0000 mg | ORAL_TABLET | Freq: Every day | ORAL | 11 refills | Status: AC | PRN

## 2024-06-20 ENCOUNTER — Telehealth: Payer: Self-pay | Admitting: Family Medicine

## 2024-06-20 NOTE — Telephone Encounter (Signed)
 MRI sent to [facility] [phone]

## 2024-07-12 ENCOUNTER — Other Ambulatory Visit: Admitting: *Deleted

## 2024-07-12 ENCOUNTER — Telehealth: Payer: Self-pay | Admitting: *Deleted

## 2024-07-12 NOTE — Telephone Encounter (Signed)
 Provider called out Reschedule

## 2024-07-12 NOTE — Telephone Encounter (Signed)
 Can you call her to reschedule her EEG from today please

## 2024-07-14 ENCOUNTER — Ambulatory Visit
Admission: RE | Admit: 2024-07-14 | Discharge: 2024-07-14 | Disposition: A | Source: Ambulatory Visit | Attending: Family Medicine | Admitting: Family Medicine

## 2024-07-14 DIAGNOSIS — R410 Disorientation, unspecified: Secondary | ICD-10-CM

## 2024-07-16 ENCOUNTER — Ambulatory Visit: Payer: Self-pay | Admitting: Family Medicine

## 2024-07-26 ENCOUNTER — Other Ambulatory Visit: Admitting: *Deleted

## 2024-08-06 ENCOUNTER — Telehealth: Payer: Self-pay | Admitting: *Deleted

## 2024-08-06 ENCOUNTER — Encounter: Payer: Self-pay | Admitting: *Deleted

## 2024-08-06 ENCOUNTER — Other Ambulatory Visit: Admitting: *Deleted

## 2024-08-06 NOTE — Telephone Encounter (Signed)
 Pt  called to Cancel appt  due to being sick   Appt Cancel

## 2024-10-22 ENCOUNTER — Other Ambulatory Visit: Payer: Self-pay | Admitting: *Deleted

## 2024-10-22 NOTE — Telephone Encounter (Signed)
 Last seen on 06/19/24 Follow up scheduled on 07/30/25   Amy I prefer you to approved, I only see that Dr.Ahern has prescribed medication .   Rx is pending to be signed

## 2024-10-23 MED ORDER — ASPIRIN 81 MG PO TBEC: 81.0000 mg | DELAYED_RELEASE_TABLET | Freq: Every day | ORAL | 3 refills | Status: AC

## 2025-01-16 ENCOUNTER — Ambulatory Visit: Admitting: Family Medicine

## 2025-07-30 ENCOUNTER — Ambulatory Visit: Admitting: Family Medicine
